# Patient Record
Sex: Female | Born: 1995 | Race: Black or African American | Hispanic: No | Marital: Single | Smoking: Never smoker
Health system: Southern US, Community
[De-identification: ages and names within clinical notes are randomized; demographics above are authoritative.]

## PROBLEM LIST (undated history)

## (undated) ENCOUNTER — Inpatient Hospital Stay (HOSPITAL_COMMUNITY): Payer: Self-pay

## (undated) DIAGNOSIS — F329 Major depressive disorder, single episode, unspecified: Secondary | ICD-10-CM

## (undated) DIAGNOSIS — S060XAA Concussion with loss of consciousness status unknown, initial encounter: Secondary | ICD-10-CM

## (undated) DIAGNOSIS — F32A Depression, unspecified: Secondary | ICD-10-CM

## (undated) DIAGNOSIS — A64 Unspecified sexually transmitted disease: Secondary | ICD-10-CM

## (undated) DIAGNOSIS — E119 Type 2 diabetes mellitus without complications: Secondary | ICD-10-CM

## (undated) DIAGNOSIS — G43009 Migraine without aura, not intractable, without status migrainosus: Secondary | ICD-10-CM

## (undated) DIAGNOSIS — S060X9A Concussion with loss of consciousness of unspecified duration, initial encounter: Secondary | ICD-10-CM

## (undated) DIAGNOSIS — F419 Anxiety disorder, unspecified: Secondary | ICD-10-CM

## (undated) DIAGNOSIS — N946 Dysmenorrhea, unspecified: Secondary | ICD-10-CM

## (undated) DIAGNOSIS — G43909 Migraine, unspecified, not intractable, without status migrainosus: Secondary | ICD-10-CM

## (undated) HISTORY — PX: WISDOM TOOTH EXTRACTION: SHX21

## (undated) HISTORY — DX: Unspecified sexually transmitted disease: A64

## (undated) HISTORY — DX: Dysmenorrhea, unspecified: N94.6

## (undated) HISTORY — DX: Anxiety disorder, unspecified: F41.9

## (undated) HISTORY — DX: Depression, unspecified: F32.A

## (undated) HISTORY — PX: MOUTH SURGERY: SHX715

## (undated) HISTORY — PX: NO PAST SURGERIES: SHX2092

## (undated) HISTORY — DX: Type 2 diabetes mellitus without complications: E11.9

## (undated) HISTORY — DX: Migraine without aura, not intractable, without status migrainosus: G43.009

---
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
## 2014-02-14 ENCOUNTER — Encounter (HOSPITAL_COMMUNITY): Payer: Self-pay | Admitting: Emergency Medicine

## 2014-02-14 ENCOUNTER — Emergency Department (INDEPENDENT_AMBULATORY_CARE_PROVIDER_SITE_OTHER)
Admission: EM | Admit: 2014-02-14 | Discharge: 2014-02-14 | Disposition: A | Discharge status: Home / Self Care | Payer: Medicaid Other | Source: Home / Self Care | Attending: Emergency Medicine | Admitting: Emergency Medicine

## 2014-02-14 DIAGNOSIS — G43909 Migraine, unspecified, not intractable, without status migrainosus: Secondary | ICD-10-CM

## 2014-02-14 MED ORDER — KETOROLAC TROMETHAMINE 60 MG/2ML IM SOLN
30.0000 mg | Freq: Once | INTRAMUSCULAR | Status: AC
  Administered 2014-02-14: 30 mg via INTRAMUSCULAR

## 2014-02-14 MED ORDER — SUMATRIPTAN SUCCINATE 50 MG PO TABS: 50.0000 mg | ORAL_TABLET | ORAL | Status: DC | PRN

## 2014-02-14 MED ORDER — KETOROLAC TROMETHAMINE 30 MG/ML IJ SOLN
INTRAMUSCULAR | Status: AC
  Filled 2014-02-14: qty 1

## 2014-02-14 MED ORDER — DEXAMETHASONE SODIUM PHOSPHATE 10 MG/ML IJ SOLN
INTRAMUSCULAR | Status: AC
  Filled 2014-02-14: qty 1

## 2014-02-14 MED ORDER — DEXAMETHASONE SODIUM PHOSPHATE 10 MG/ML IJ SOLN
10.0000 mg | Freq: Once | INTRAMUSCULAR | Status: AC
  Administered 2014-02-14: 10 mg via INTRAMUSCULAR

## 2014-02-14 MED ORDER — SUMATRIPTAN SUCCINATE 6 MG/0.5ML ~~LOC~~ SOLN
6.0000 mg | Freq: Once | SUBCUTANEOUS | Status: AC
  Administered 2014-02-14: 6 mg via SUBCUTANEOUS

## 2014-02-14 MED ORDER — SUMATRIPTAN SUCCINATE 6 MG/0.5ML ~~LOC~~ SOLN
SUBCUTANEOUS | Status: AC
  Filled 2014-02-14: qty 0.5

## 2014-02-14 NOTE — Discharge Instructions (Signed)
Migraine Headache A migraine headache is an intense, throbbing pain on one or both sides of your head. A migraine can last for 30 minutes to several hours. CAUSES  The exact cause of a migraine headache is not always known. However, a migraine may be caused when nerves in the brain become irritated and release chemicals that cause inflammation. This causes pain. Certain things may also trigger migraines, such as:  Alcohol.  Smoking.  Stress.  Menstruation.  Aged cheeses.  Foods or drinks that contain nitrates, glutamate, aspartame, or tyramine.  Lack of sleep.  Chocolate.  Caffeine.  Hunger.  Physical exertion.  Fatigue.  Medicines used to treat chest pain (nitroglycerine), birth control pills, estrogen, and some blood pressure medicines. SIGNS AND SYMPTOMS  Pain on one or both sides of your head.  Pulsating or throbbing pain.  Severe pain that prevents daily activities.  Pain that is aggravated by any physical activity.  Nausea, vomiting, or both.  Dizziness.  Pain with exposure to bright lights, loud noises, or activity.  General sensitivity to bright lights, loud noises, or smells. Before you get a migraine, you may get warning signs that a migraine is coming (aura). An aura may include:  Seeing flashing lights.  Seeing bright spots, halos, or zig-zag lines.  Having tunnel vision or blurred vision.  Having feelings of numbness or tingling.  Having trouble talking.  Having muscle weakness. DIAGNOSIS  A migraine headache is often diagnosed based on:  Symptoms.  Physical exam.  A CT scan or MRI of your head. These imaging tests cannot diagnose migraines, but they can help rule out other causes of headaches. TREATMENT Medicines may be given for pain and nausea. Medicines can also be given to help prevent recurrent migraines.  HOME CARE INSTRUCTIONS  Only take over-the-counter or prescription medicines for pain or discomfort as directed by your  health care provider. The use of long-term narcotics is not recommended.  Lie down in a dark, quiet room when you have a migraine.  Keep a journal to find out what may trigger your migraine headaches. For example, write down:  What you eat and drink.  How much sleep you get.  Any change to your diet or medicines.  Limit alcohol consumption.  Quit smoking if you smoke.  Get 7 9 hours of sleep, or as recommended by your health care provider.  Limit stress.  Keep lights dim if bright lights bother you and make your migraines worse. SEEK IMMEDIATE MEDICAL CARE IF:   Your migraine becomes severe.  You have a fever.  You have a stiff neck.  You have vision loss.  You have muscular weakness or loss of muscle control.  You start losing your balance or have trouble walking.  You feel faint or pass out.  You have severe symptoms that are different from your first symptoms. MAKE SURE YOU:   Understand these instructions.  Will watch your condition.  Will get help right away if you are not doing well or get worse. Document Released: 10/22/2005 Document Revised: 08/12/2013 Document Reviewed: 06/29/2013 [employer] Patient Information 2014 [employer].  

## 2014-02-14 NOTE — ED Provider Notes (Signed)
  Chief Complaint   Chief Complaint  Patient presents with  . Migraine    History of Present Illness   Ebony Snyder is a 18 year old female who has had a history since last night of a migraine type headache localized to the right side. This is not throbbing in nature. Last night it was a 9/10 in intensity and now is down to 5/10 in intensity. It's been associated with subjective fever but no stiff neck. She denies any diplopia, blurry vision, or other visual symptoms. She's had no numbness, tingling, paresthesias, weakness, or difficulty with speech or ambulation and the patient has had migraines for 3 or 4 years. These may occur as often as 2 per week or as infrequently as once every few months. Her last migraine headache was in March. There is no specific precipitating factor.  Review of Systems   Other than as noted above, the patient denies any of the following symptoms: Systemic:  No fever, chills, photophobia, or stiff neck. Eye:  No blurred vision, or diplopia. ENT:  No nasal congestion, rhinorrhea, sinus pressure or pain, or sore throat.  No jaw claudication. Neuro:  No paresthesias, loss of consciousness, seizure activity, muscle weakness, trouble with coordination or gait, trouble speaking or swallowing. Psych:  No depression, anxiety or trouble sleeping.  PMFSH   Past medical history, family history, social history, meds, and allergies were reviewed.    Physical Examination    Vital signs:  BP 109/70  Pulse 64  Temp(Src) 98.2 F (36.8 C) (Oral)  Resp 18  SpO2 98% General:  Alert and oriented.  In no distress. Eye:  Lids and conjunctivas normal.  PERRL,  Full EOMs.  Fundi benign with normal discs and vessels. ENT:  No cranial or facial tenderness to palpation.  TMs and canals clear.  Nasal mucosa was normal and uncongested without any drainage. No intra oral lesions, pharynx clear, mucous membranes moist, dentition normal. Neck:  Supple, full ROM, no tenderness to  palpation.  No adenopathy or mass. Neuro:  Alert and orented times 3.  Speech was clear, fluent, and appropriate.  Cranial nerves intact. No pronator drift, muscle strength normal. Finger to nose normal.  DTRs were 2+ and symmetrical.Station and gait were normal.  Romberg's sign was normal.  Able to perform tandem gait well. Psych:  Normal affect.  Course in [facility]     Given Imitrex 6 mg subcutaneous, Toradol 30 mg IM, and Decadron 10 mg IM.  Assessment   The encounter diagnosis was Migraine headache.  Plan   1.  Meds:  The following meds were prescribed:   Discharge Medication List as of 02/14/2014  5:35 PM    START taking these medications   Details  SUMAtriptan (IMITREX) 50 MG tablet Take 1 tablet (50 mg total) by mouth every 2 (two) hours as needed for migraine or headache. May repeat in 2 hours if headache persists or recurs., Starting 02/14/2014, Until Discontinued, Normal        2.  Patient Education/Counseling:  The patient was given appropriate handouts, self care instructions, and instructed in symptomatic relief.    3.  Follow up:  The patient was told to follow up here if no better in 3 to 4 days, or sooner if becoming worse in any way, and given some red flag symptoms such as fever, worsening pain, persistent vomiting, or new neurological symptoms which would prompt immediate return.       Reuben Likesavid C Ulysse Siemen, MD 02/14/14 [phone]

## 2014-02-14 NOTE — ED Notes (Signed)
Pt c/o migraine headaches x 1 day. This is a recurrent problem. Pt reports she usually takes tramadol but ran out of her prescription. Pt has a throbbing pain over her right eye. Pt is alert and oriented.

## 2014-06-28 ENCOUNTER — Emergency Department (HOSPITAL_COMMUNITY)
Admission: EM | Admit: 2014-06-28 | Discharge: 2014-06-28 | Disposition: A | Discharge status: Home / Self Care | Payer: No Typology Code available for payment source | Attending: Emergency Medicine | Admitting: Emergency Medicine

## 2014-06-28 ENCOUNTER — Encounter (HOSPITAL_COMMUNITY): Payer: Self-pay | Admitting: Emergency Medicine

## 2014-06-28 ENCOUNTER — Emergency Department (HOSPITAL_COMMUNITY): Payer: No Typology Code available for payment source

## 2014-06-28 DIAGNOSIS — Z8679 Personal history of other diseases of the circulatory system: Secondary | ICD-10-CM | POA: Diagnosis not present

## 2014-06-28 DIAGNOSIS — S0993XA Unspecified injury of face, initial encounter: Secondary | ICD-10-CM | POA: Diagnosis not present

## 2014-06-28 DIAGNOSIS — S199XXA Unspecified injury of neck, initial encounter: Secondary | ICD-10-CM

## 2014-06-28 DIAGNOSIS — Y92410 Unspecified street and highway as the place of occurrence of the external cause: Secondary | ICD-10-CM | POA: Insufficient documentation

## 2014-06-28 DIAGNOSIS — S060X0A Concussion without loss of consciousness, initial encounter: Secondary | ICD-10-CM | POA: Diagnosis not present

## 2014-06-28 DIAGNOSIS — S0990XA Unspecified injury of head, initial encounter: Secondary | ICD-10-CM | POA: Insufficient documentation

## 2014-06-28 DIAGNOSIS — Y9389 Activity, other specified: Secondary | ICD-10-CM | POA: Diagnosis not present

## 2014-06-28 MED ORDER — ONDANSETRON HCL 4 MG PO TABS: 4.0000 mg | ORAL_TABLET | Freq: Four times a day (QID) | ORAL | Status: DC

## 2014-06-28 MED ORDER — KETOROLAC TROMETHAMINE 30 MG/ML IJ SOLN: 30.0000 mg | Freq: Once | INTRAMUSCULAR | Status: DC

## 2014-06-28 MED ORDER — METOCLOPRAMIDE HCL 5 MG/ML IJ SOLN: 10.0000 mg | Freq: Once | INTRAMUSCULAR | Status: DC

## 2014-06-28 MED ORDER — IBUPROFEN 800 MG PO TABS
800.0000 mg | ORAL_TABLET | Freq: Once | ORAL | Status: AC
  Administered 2014-06-28: 800 mg via ORAL
  Filled 2014-06-28: qty 1

## 2014-06-28 MED ORDER — SODIUM CHLORIDE 0.9 % IV BOLUS (SEPSIS)
1000.0000 mL | Freq: Once | INTRAVENOUS | Status: DC
Start: 2014-06-28 — End: 2014-06-28

## 2014-06-28 MED ORDER — IBUPROFEN 800 MG PO TABS: 800.0000 mg | ORAL_TABLET | Freq: Three times a day (TID) | ORAL | Status: DC

## 2014-06-28 MED ORDER — DIPHENHYDRAMINE HCL 50 MG/ML IJ SOLN
25.0000 mg | Freq: Once | INTRAMUSCULAR | Status: DC
Start: 2014-06-28 — End: 2014-06-28
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
## 2014-06-28 NOTE — Discharge Instructions (Signed)
Concussion  A concussion, or closed-head injury, is a brain injury caused by a direct blow to the head or by a quick and sudden movement (jolt) of the head or neck. Concussions are usually not life-threatening. Even so, the effects of a concussion can be serious. If you have had a concussion before, you are more likely to experience concussion-like symptoms after a direct blow to the head.   CAUSES  · Direct blow to the head, such as from running into another player during a soccer game, being hit in a fight, or hitting your head on a hard surface.  · A jolt of the head or neck that causes the brain to move back and forth inside the skull, such as in a car crash.  SIGNS AND SYMPTOMS  The signs of a concussion can be hard to notice. Early on, they may be missed by you, family members, and health care providers. You may look fine but act or feel differently.  Symptoms are usually temporary, but they may last for days, weeks, or even longer. Some symptoms may appear right away while others may not show up for hours or days. Every head injury is different. Symptoms include:  · Mild to moderate headaches that will not go away.  · A feeling of pressure inside your head.  · Having more trouble than usual:  ¨ Learning or remembering things you have heard.  ¨ Answering questions.  ¨ Paying attention or concentrating.  ¨ Organizing daily tasks.  ¨ Making decisions and solving problems.  · Slowness in thinking, acting or reacting, speaking, or reading.  · Getting lost or being easily confused.  · Feeling tired all the time or lacking energy (fatigued).  · Feeling drowsy.  · Sleep disturbances.  ¨ Sleeping more than usual.  ¨ Sleeping less than usual.  ¨ Trouble falling asleep.  ¨ Trouble sleeping (insomnia).  · Loss of balance or feeling lightheaded or dizzy.  · Nausea or vomiting.  · Numbness or tingling.  · Increased sensitivity to:  ¨ Sounds.  ¨ Lights.  ¨ Distractions.  · Vision problems or eyes that tire  easily.  · Diminished sense of taste or smell.  · Ringing in the ears.  · Mood changes such as feeling sad or anxious.  · Becoming easily irritated or angry for little or no reason.  · Lack of motivation.  · Seeing or hearing things other people do not see or hear (hallucinations).  DIAGNOSIS  Your health care provider can usually diagnose a concussion based on a description of your injury and symptoms. He or she will ask whether you passed out (lost consciousness) and whether you are having trouble remembering events that happened right before and during your injury.  Your evaluation might include:  · A brain scan to look for signs of injury to the brain. Even if the test shows no injury, you may still have a concussion.  · Blood tests to be sure other problems are not present.  TREATMENT  · Concussions are usually treated in an emergency department, in urgent care, or at a clinic. You may need to stay in the hospital overnight for further treatment.  · Tell your health care provider if you are taking any medicines, including prescription medicines, over-the-counter medicines, and natural remedies. Some medicines, such as blood thinners (anticoagulants) and aspirin, may increase the chance of complications. Also tell your health care provider whether you have had alcohol or are taking illegal drugs. This information   may affect treatment.  · Your health care provider will send you home with important instructions to follow.  · How fast you will recover from a concussion depends on many factors. These factors include how severe your concussion is, what part of your brain was injured, your age, and how healthy you were before the concussion.  · Most people with mild injuries recover fully. Recovery can take time. In general, recovery is slower in older persons. Also, persons who have had a concussion in the past or have other medical problems may find that it takes longer to recover from their current injury.  HOME  CARE INSTRUCTIONS  General Instructions  · Carefully follow the directions your health care provider gave you.  · Only take over-the-counter or prescription medicines for pain, discomfort, or fever as directed by your health care provider.  · Take only those medicines that your health care provider has approved.  · Do not drink alcohol until your health care provider says you are well enough to do so. Alcohol and certain other drugs may slow your recovery and can put you at risk of further injury.  · If it is harder than usual to remember things, write them down.  · If you are easily distracted, try to do one thing at a time. For example, do not try to watch TV while fixing dinner.  · Talk with family members or close friends when making important decisions.  · Keep all follow-up appointments. Repeated evaluation of your symptoms is recommended for your recovery.  · Watch your symptoms and tell others to do the same. Complications sometimes occur after a concussion. Older adults with a brain injury may have a higher risk of serious complications, such as a blood clot on the brain.  · Tell your teachers, school nurse, school counselor, coach, athletic trainer, or work manager about your injury, symptoms, and restrictions. Tell them about what you can or cannot do. They should watch for:  ¨ Increased problems with attention or concentration.  ¨ Increased difficulty remembering or learning new information.  ¨ Increased time needed to complete tasks or assignments.  ¨ Increased irritability or decreased ability to cope with stress.  ¨ Increased symptoms.  · Rest. Rest helps the brain to heal. Make sure you:  ¨ Get plenty of sleep at night. Avoid staying up late at night.  ¨ Keep the same bedtime hours on weekends and weekdays.  ¨ Rest during the day. Take daytime naps or rest breaks when you feel tired.  · Limit activities that require a lot of thought or concentration. These include:  ¨ Doing homework or job-related  work.  ¨ Watching TV.  ¨ Working on the computer.  · Avoid any situation where there is potential for another head injury (football, hockey, soccer, basketball, martial arts, downhill snow sports and horseback riding). Your condition will get worse every time you experience a concussion. You should avoid these activities until you are evaluated by the appropriate follow-up health care providers.  Returning To Your Regular Activities  You will need to return to your normal activities slowly, not all at once. You must give your body and brain enough time for recovery.  · Do not return to sports or other athletic activities until your health care provider tells you it is safe to do so.  · Ask your health care provider when you can drive, ride a bicycle, or operate heavy machinery. Your ability to react may be slower after a   brain injury. Never do these activities if you are dizzy.  · Ask your health care provider about when you can return to work or school.  Preventing Another Concussion  It is very important to avoid another brain injury, especially before you have recovered. In rare cases, another injury can lead to permanent brain damage, brain swelling, or death. The risk of this is greatest during the first 7-10 days after a head injury. Avoid injuries by:  · Wearing a seat belt when riding in a car.  · Drinking alcohol only in moderation.  · Wearing a helmet when biking, skiing, skateboarding, skating, or doing similar activities.  · Avoiding activities that could lead to a second concussion, such as contact or recreational sports, until your health care provider says it is okay.  · Taking safety measures in your home.  ¨ Remove clutter and tripping hazards from floors and stairways.  ¨ Use grab bars in bathrooms and handrails by stairs.  ¨ Place non-slip mats on floors and in bathtubs.  ¨ Improve lighting in dim areas.  SEEK MEDICAL CARE IF:  · You have increased problems paying attention or  concentrating.  · You have increased difficulty remembering or learning new information.  · You need more time to complete tasks or assignments than before.  · You have increased irritability or decreased ability to cope with stress.  · You have more symptoms than before.  Seek medical care if you have any of the following symptoms for more than 2 weeks after your injury:  · Lasting (chronic) headaches.  · Dizziness or balance problems.  · Nausea.  · Vision problems.  · Increased sensitivity to noise or light.  · Depression or mood swings.  · Anxiety or irritability.  · Memory problems.  · Difficulty concentrating or paying attention.  · Sleep problems.  · Feeling tired all the time.  SEEK IMMEDIATE MEDICAL CARE IF:  · You have severe or worsening headaches. These may be a sign of a blood clot in the brain.  · You have weakness (even if only in one hand, leg, or part of the face).  · You have numbness.  · You have decreased coordination.  · You vomit repeatedly.  · You have increased sleepiness.  · One pupil is larger than the other.  · You have convulsions.  · You have slurred speech.  · You have increased confusion. This may be a sign of a blood clot in the brain.  · You have increased restlessness, agitation, or irritability.  · You are unable to recognize people or places.  · You have neck pain.  · It is difficult to wake you up.  · You have unusual behavior changes.  · You lose consciousness.  MAKE SURE YOU:  · Understand these instructions.  · Will watch your condition.  · Will get help right away if you are not doing well or get worse.  Document Released: 01/12/2004 Document Revised: 10/27/2013 Document Reviewed: 05/14/2013  Exit[employer]® Patient Information ©2015 [employer]. This information is not intended to replace advice given to you by your health care provider. Make sure you discuss any questions you have with your health care provider.

## 2014-08-25 ENCOUNTER — Encounter (HOSPITAL_COMMUNITY): Payer: Self-pay | Admitting: Emergency Medicine

## 2014-08-25 ENCOUNTER — Emergency Department (HOSPITAL_COMMUNITY)
Admission: EM | Admit: 2014-08-25 | Discharge: 2014-08-25 | Discharge status: Against Medical Advice | Payer: No Typology Code available for payment source | Attending: Emergency Medicine | Admitting: Emergency Medicine

## 2014-08-25 DIAGNOSIS — Y9389 Activity, other specified: Secondary | ICD-10-CM | POA: Insufficient documentation

## 2014-08-25 DIAGNOSIS — Y92410 Unspecified street and highway as the place of occurrence of the external cause: Secondary | ICD-10-CM | POA: Insufficient documentation

## 2014-08-25 DIAGNOSIS — S0990XA Unspecified injury of head, initial encounter: Secondary | ICD-10-CM | POA: Insufficient documentation

## 2014-08-25 NOTE — ED Notes (Signed)
Patient is not in the room yet.  

## 2014-08-25 NOTE — ED Notes (Signed)
Unable to locate pt - pager has been returned to desk.

## 2014-08-25 NOTE — ED Notes (Signed)
Patient is not in the room.
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
## 2014-09-21 ENCOUNTER — Encounter (HOSPITAL_COMMUNITY): Payer: Self-pay

## 2014-09-21 ENCOUNTER — Emergency Department (HOSPITAL_COMMUNITY)
Admission: EM | Admit: 2014-09-21 | Discharge: 2014-09-21 | Disposition: A | Discharge status: Home / Self Care | Payer: BC Managed Care – PPO | Attending: Emergency Medicine | Admitting: Emergency Medicine

## 2014-09-21 DIAGNOSIS — Z720 Tobacco use: Secondary | ICD-10-CM | POA: Insufficient documentation

## 2014-09-21 DIAGNOSIS — G43909 Migraine, unspecified, not intractable, without status migrainosus: Secondary | ICD-10-CM | POA: Insufficient documentation

## 2014-09-21 DIAGNOSIS — Z87828 Personal history of other (healed) physical injury and trauma: Secondary | ICD-10-CM | POA: Insufficient documentation

## 2014-09-21 DIAGNOSIS — Z3202 Encounter for pregnancy test, result negative: Secondary | ICD-10-CM | POA: Insufficient documentation

## 2014-09-21 DIAGNOSIS — R062 Wheezing: Secondary | ICD-10-CM | POA: Insufficient documentation

## 2014-09-21 LAB — URINALYSIS, ROUTINE W REFLEX MICROSCOPIC
Bilirubin Urine: NEGATIVE
GLUCOSE, UA: NEGATIVE mg/dL
Ketones, ur: 15 mg/dL — AB
Nitrite: NEGATIVE
PROTEIN: 100 mg/dL — AB
SPECIFIC GRAVITY, URINE: 1.029 (ref 1.005–1.030)
UROBILINOGEN UA: 1 mg/dL (ref 0.0–1.0)
pH: 8 (ref 5.0–8.0)

## 2014-09-21 LAB — URINE MICROSCOPIC-ADD ON

## 2014-09-21 LAB — POC URINE PREG, ED: PREG TEST UR: NEGATIVE

## 2014-09-21 MED ORDER — KETOROLAC TROMETHAMINE 60 MG/2ML IM SOLN
60.0000 mg | Freq: Once | INTRAMUSCULAR | Status: AC
  Administered 2014-09-21: 60 mg via INTRAMUSCULAR
  Filled 2014-09-21: qty 2

## 2014-09-21 MED ORDER — ALBUTEROL SULFATE HFA 108 (90 BASE) MCG/ACT IN AERS
2.0000 | INHALATION_SPRAY | Freq: Once | RESPIRATORY_TRACT | Status: AC
  Administered 2014-09-21: 2 via RESPIRATORY_TRACT
  Filled 2014-09-21: qty 6.7

## 2014-09-21 MED ORDER — METOCLOPRAMIDE HCL 5 MG/ML IJ SOLN
10.0000 mg | Freq: Once | INTRAMUSCULAR | Status: DC
Start: 2014-09-21 — End: 2014-09-21

## 2014-09-21 MED ORDER — DIPHENHYDRAMINE HCL 50 MG/ML IJ SOLN
25.0000 mg | Freq: Once | INTRAMUSCULAR | Status: AC
  Administered 2014-09-21: 25 mg via INTRAMUSCULAR
  Filled 2014-09-21: qty 1

## 2014-09-21 MED ORDER — TRAMADOL HCL 50 MG PO TABS: 50.0000 mg | ORAL_TABLET | Freq: Four times a day (QID) | ORAL | Status: DC | PRN

## 2014-09-21 MED ORDER — METOCLOPRAMIDE HCL 5 MG/ML IJ SOLN
10.0000 mg | Freq: Once | INTRAMUSCULAR | Status: AC
  Administered 2014-09-21: 10 mg via INTRAMUSCULAR
  Filled 2014-09-21: qty 2

## 2014-09-21 NOTE — Discharge Instructions (Signed)
Tramadol as needed for pain. I would also try Excedrin migraine. Follow up with your doctor.    Migraine Headache A migraine headache is an intense, throbbing pain on one or both sides of your head. A migraine can last for 30 minutes to several hours. CAUSES  The exact cause of a migraine headache is not always known. However, a migraine may be caused when nerves in the brain become irritated and release chemicals that cause inflammation. This causes pain. Certain things may also trigger migraines, such as:  Alcohol.  Smoking.  Stress.  Menstruation.  Aged cheeses.  Foods or drinks that contain nitrates, glutamate, aspartame, or tyramine.  Lack of sleep.  Chocolate.  Caffeine.  Hunger.  Physical exertion.  Fatigue.  Medicines used to treat chest pain (nitroglycerine), birth control pills, estrogen, and some blood pressure medicines. SIGNS AND SYMPTOMS  Pain on one or both sides of your head.  Pulsating or throbbing pain.  Severe pain that prevents daily activities.  Pain that is aggravated by any physical activity.  Nausea, vomiting, or both.  Dizziness.  Pain with exposure to bright lights, loud noises, or activity.  General sensitivity to bright lights, loud noises, or smells. Before you get a migraine, you may get warning signs that a migraine is coming (aura). An aura may include:  Seeing flashing lights.  Seeing bright spots, halos, or zigzag lines.  Having tunnel vision or blurred vision.  Having feelings of numbness or tingling.  Having trouble talking.  Having muscle weakness. DIAGNOSIS  A migraine headache is often diagnosed based on:  Symptoms.  Physical exam.  A CT scan or MRI of your head. These imaging tests cannot diagnose migraines, but they can help rule out other causes of headaches. TREATMENT Medicines may be given for pain and nausea. Medicines can also be given to help prevent recurrent migraines.  HOME CARE  INSTRUCTIONS  Only take over-the-counter or prescription medicines for pain or discomfort as directed by your health care provider. The use of long-term narcotics is not recommended.  Lie down in a dark, quiet room when you have a migraine.  Keep a journal to find out what may trigger your migraine headaches. For example, write down:  What you eat and drink.  How much sleep you get.  Any change to your diet or medicines.  Limit alcohol consumption.  Quit smoking if you smoke.  Get 7-9 hours of sleep, or as recommended by your health care provider.  Limit stress.  Keep lights dim if bright lights bother you and make your migraines worse. SEEK IMMEDIATE MEDICAL CARE IF:   Your migraine becomes severe.  You have a fever.  You have a stiff neck.  You have vision loss.  You have muscular weakness or loss of muscle control.  You start losing your balance or have trouble walking.  You feel faint or pass out.  You have severe symptoms that are different from your first symptoms. MAKE SURE YOU:   Understand these instructions.  Will watch your condition.  Will get help right away if you are not doing well or get worse. Document Released: 10/22/2005 Document Revised: 03/08/2014 Document Reviewed: 06/29/2013 [facility] CExit[facility] Patient Information 2015 [place], [place]. This information is not intended to replace advice given to you by your health care provider. Make sure you discuss any questions you have with your health care provider.

## 2014-09-21 NOTE — ED Provider Notes (Signed)
CSN: [id number]     Arrival date & time 09/21/14  1028 History   First MD Initiated Contact with Patient 09/21/14 1117     Chief Complaint  Patient presents with  . Migraine     (Consider location/radiation/quality/duration/timing/severity/associated sxs/prior Treatment) HPI Ebony Snyder is a 18 y.o. female who presents to ED complaining of a migraine headache. Pt states she has hx of migraines. States she headache started this morning. Reports pain in right side of the head, blurred vision, typical of her migraines. States used to get them every few days, but recently she has not had them as frequently. States normally takes tramadol for this but ran out. No medications taken prior to coming in. Pt reports associated nausea and vomiting. No numbness or weakness in extremities. No difficulties with speech, walking, with coordination. No other complaints.   Past Medical History  Diagnosis Date  . Migraines   . Concussion    History reviewed. No pertinent past surgical history. No family history on file. History  Substance Use Topics  . Smoking status: Never Smoker   . Smokeless tobacco: Not on file  . Alcohol Use: No   OB History    No data available     Review of Systems  Constitutional: Negative for fever and chills.  Eyes: Positive for photophobia and visual disturbance.  Respiratory: Negative for cough, chest tightness and shortness of breath.   Cardiovascular: Negative for chest pain, palpitations and leg swelling.  Gastrointestinal: Negative for nausea, vomiting, abdominal pain and diarrhea.  Genitourinary: Negative for dysuria.  Musculoskeletal: Negative for myalgias, arthralgias, neck pain and neck stiffness.  Skin: Negative for rash.  Neurological: Positive for dizziness and headaches. Negative for weakness.  All other systems reviewed and are negative.     Allergies  Review of patient's allergies indicates no known allergies.  Home Medications   Prior  to Admission medications   Not on File   BP 105/60 mmHg  Pulse 64  Temp(Src) 97.7 F (36.5 C) (Oral)  Resp 20  SpO2 97%  LMP 08/17/2014 Physical Exam  Constitutional: She is oriented to person, place, and time. She appears well-developed and well-nourished. No distress.  HENT:  Head: Normocephalic.  Eyes: Conjunctivae and EOM are normal. Pupils are equal, round, and reactive to light.  Neck: Neck supple.  Cardiovascular: Normal rate, regular rhythm and normal heart sounds.   Pulmonary/Chest: Effort normal. No respiratory distress. She has wheezes. She has no rales.  Abdominal: Soft. Bowel sounds are normal. She exhibits no distension. There is no tenderness. There is no rebound.  Musculoskeletal: She exhibits no edema.  Neurological: She is alert and oriented to person, place, and time. No cranial nerve deficit. Coordination normal.  5/5 and equal upper and lower extremity strength bilaterally. Equal grip strength bilaterally. Normal finger to nose and heel to shin. No pronator drift.   Skin: Skin is warm and dry.  Psychiatric: She has a normal mood and affect. Her behavior is normal.  Nursing note and vitals reviewed.   ED Course  Procedures (including critical care time) Labs Review Labs Reviewed  URINALYSIS, ROUTINE W REFLEX MICROSCOPIC - Abnormal; Notable for the following:    Color, Urine AMBER (*)    APPearance TURBID (*)    Hgb urine dipstick LARGE (*)    Ketones, ur 15 (*)    Protein, ur 100 (*)    Leukocytes, UA SMALL (*)    All other components within normal limits  URINE MICROSCOPIC-ADD ON -  Abnormal; Notable for the following:    Squamous Epithelial / LPF FEW (*)    Bacteria, UA FEW (*)    Crystals TRIPLE PHOSPHATE CRYSTALS (*)    All other components within normal limits  POC URINE PREG, ED    Imaging Review No results found.   EKG Interpretation None      MDM   Final diagnoses:  Migraine without status migrainosus, not intractable, unspecified  migraine type   Patient is here with typical for her migraine, onset this morning. Will treat with Toradol, Reglan, Benadryl. A urine analysis urine pregnancy ordered. Patient also noted to be wheezing on exam. She is a smoker. Will give albuterol inhaler. vital signs are normal.  1:07 PM Patient is feeling much better. Wants to go home. States she usually takes tramadol at home but ran out. We'll prescribed 15 tablets. Follow-up with primary care doctor  Filed Vitals:   09/21/14 1126 09/21/14 1130 09/21/14 1230 09/21/14 1323  BP: 105/60 112/76 98/47 123/102  Pulse: 64 76 71 56  Temp:    98.6 F (37 C)  TempSrc:    Oral  Resp: 20   18  SpO2: 97% 99% 97% 100%     Lottie Musselatyana A Glenice Ciccone, PA-C 09/21/14 1600  Rolland PorterMark James, MD 09/29/14 1339

## 2014-09-21 NOTE — ED Notes (Signed)
PA at the bedside.

## 2014-09-21 NOTE — ED Notes (Addendum)
Pt reports migraine that started this morning.  Pt alert and oriented.  Pt denies taking any medicines today.

## 2014-12-25 ENCOUNTER — Encounter (HOSPITAL_COMMUNITY): Payer: Self-pay | Admitting: Adult Health

## 2014-12-25 ENCOUNTER — Emergency Department (HOSPITAL_COMMUNITY)
Admission: EM | Admit: 2014-12-25 | Discharge: 2014-12-25 | Disposition: A | Discharge status: Home / Self Care | Payer: Medicaid Other | Attending: Emergency Medicine | Admitting: Emergency Medicine

## 2014-12-25 DIAGNOSIS — Z87828 Personal history of other (healed) physical injury and trauma: Secondary | ICD-10-CM | POA: Insufficient documentation

## 2014-12-25 DIAGNOSIS — G43109 Migraine with aura, not intractable, without status migrainosus: Secondary | ICD-10-CM | POA: Insufficient documentation

## 2014-12-25 MED ORDER — DEXAMETHASONE 4 MG PO TABS
12.0000 mg | ORAL_TABLET | Freq: Once | ORAL | Status: AC
  Administered 2014-12-25: 12 mg via ORAL
  Filled 2014-12-25: qty 3

## 2014-12-25 MED ORDER — DIPHENHYDRAMINE HCL 25 MG PO CAPS
25.0000 mg | ORAL_CAPSULE | Freq: Once | ORAL | Status: DC
  Filled 2014-12-25: qty 1

## 2014-12-25 MED ORDER — DEXAMETHASONE 1 MG/ML PO CONC: 12.0000 mg | Freq: Once | ORAL | Status: DC

## 2014-12-25 MED ORDER — METOCLOPRAMIDE HCL 10 MG PO TABS
10.0000 mg | ORAL_TABLET | Freq: Once | ORAL | Status: AC
  Administered 2014-12-25: 10 mg via ORAL
  Filled 2014-12-25: qty 1

## 2014-12-25 MED ORDER — TRAMADOL HCL 50 MG PO TABS: 50.0000 mg | ORAL_TABLET | Freq: Four times a day (QID) | ORAL | Status: DC | PRN

## 2014-12-25 MED ORDER — TRAMADOL HCL 50 MG PO TABS: 50.0000 mg | ORAL_TABLET | Freq: Once | ORAL | Status: DC

## 2014-12-25 NOTE — ED Notes (Signed)
Resent with blurred vision and throbbing pain behind left eye-this happens in her right eye when she gets migraines, denies headache at this time, dneis nausea. Pain began at 7 pm

## 2014-12-25 NOTE — Discharge Instructions (Signed)

## 2014-12-25 NOTE — ED Provider Notes (Signed)
CSN: [id number]     Arrival date & time 12/25/14  2112 History   First MD Initiated Contact with Patient 12/25/14 2130     Chief Complaint  Patient presents with  . Blurred Vision     (Consider location/radiation/quality/duration/timing/severity/associated sxs/prior Treatment) HPI Comments: L throbbing and blurred vision, resolved now. This is her normal aura prior to her migraines.  Patient is a 19 y.o. female presenting with eye problem. The history is provided by the patient.  Eye Problem Location:  L eye Quality: throbbing. Severity:  Mild Onset quality:  Sudden Timing:  Constant Progression:  Resolved Chronicity:  Recurrent Associated symptoms: no vomiting     Past Medical History  Diagnosis Date  . Migraines   . Concussion    History reviewed. No pertinent past surgical history. History reviewed. No pertinent family history. History  Substance Use Topics  . Smoking status: Never Smoker   . Smokeless tobacco: Not on file  . Alcohol Use: No   OB History    No data available     Review of Systems  Constitutional: Negative for fever.  Respiratory: Negative for cough and shortness of breath.   Gastrointestinal: Negative for vomiting and abdominal pain.  All other systems reviewed and are negative.     Allergies  Review of patient's allergies indicates no known allergies.  Home Medications   Prior to Admission medications   Medication Sig Start Date End Date Taking? Authorizing Provider  traMADol (ULTRAM) 50 MG tablet Take 1 tablet (50 mg total) by mouth every 6 (six) hours as needed. 09/21/14   Tatyana A Kirichenko, PA-C   BP 117/72 mmHg  Pulse 74  Temp(Src) 99.4 F (37.4 C) (Oral)  Resp 18  Ht 5\' 2"  (1.575 m)  Wt 172 lb (78.019 kg)  BMI 31.45 kg/m2  SpO2 100%  LMP 11/30/2014 (Exact Date) Physical Exam  Constitutional: She is oriented to person, place, and time. She appears well-developed and well-nourished. No distress.  HENT:  Head:  Normocephalic and atraumatic.  Mouth/Throat: Oropharynx is clear and moist.  Eyes: EOM are normal. Pupils are equal, round, and reactive to light.  Neck: Normal range of motion. Neck supple.  Cardiovascular: Normal rate and regular rhythm.  Exam reveals no friction rub.   No murmur heard. Pulmonary/Chest: Effort normal and breath sounds normal. No respiratory distress. She has no wheezes. She has no rales.  Abdominal: Soft. She exhibits no distension. There is no tenderness. There is no rebound.  Musculoskeletal: Normal range of motion. She exhibits no edema.  Neurological: She is alert and oriented to person, place, and time.  Skin: No rash noted. She is not diaphoretic.  Nursing note and vitals reviewed.   ED Course  Procedures (including critical care time) Labs Review Labs Reviewed - No data to display  Imaging Review No results found.   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 Resolved now. This is the aura for her usual migraines, however it is usually in her R eye. No migraine now. Feels well. I offered migraine cocktail to prevent migraine from happening, but she refused IV. Meds given PO. Tramadol given also, as she normally takes those for her headaches. Neuro exam normal, doing well, relaxing comfortably. Stable for discharge.   Elwin MochaBlair Achsah Mcquade, MD 12/25/14 [phone]
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
## 2015-04-17 ENCOUNTER — Emergency Department (INDEPENDENT_AMBULATORY_CARE_PROVIDER_SITE_OTHER)
Admission: EM | Admit: 2015-04-17 | Discharge: 2015-04-17 | Disposition: A | Discharge status: Home / Self Care | Payer: Self-pay | Source: Home / Self Care | Attending: Emergency Medicine | Admitting: Emergency Medicine

## 2015-04-17 ENCOUNTER — Encounter (HOSPITAL_COMMUNITY): Payer: Self-pay | Admitting: *Deleted

## 2015-04-17 DIAGNOSIS — G43009 Migraine without aura, not intractable, without status migrainosus: Secondary | ICD-10-CM

## 2015-04-17 NOTE — ED Provider Notes (Signed)
CSN: [id number]     Arrival date & time 04/17/15  1427 History   First MD Initiated Contact with Patient 04/17/15 1549     Chief Complaint  Patient presents with  . Migraine   (Consider location/radiation/quality/duration/timing/severity/associated sxs/prior Treatment) HPI  She is a 19 year old woman here for migraine headaches. She states she has a history of migraine headaches. They used to occur quite frequently. This morning, she had her first migraine headache in over a year. She states it was right-sided, behind her eye. It was throbbing in nature. She had some mild associated nausea. No vision changes or photophobia. No focal numbness, tingling, weakness. She took some Goody powder, which did not help. The headache resolved on its own after several hours. She states Excedrin Migraine has worked well in the past. She is here today to make sure these headaches are normal.  Past Medical History  Diagnosis Date  . Migraines   . Concussion    History reviewed. No pertinent past surgical history. No family history on file. History  Substance Use Topics  . Smoking status: Never Smoker   . Smokeless tobacco: Not on file  . Alcohol Use: No   OB History    No data available     Review of Systems As in history of present illness Allergies  Review of patient's allergies indicates no known allergies.  Home Medications   Prior to Admission medications   Not on File   BP 118/75 mmHg  Pulse 70  Temp(Src) 99.2 F (37.3 C) (Oral)  Resp 16  SpO2 99% Physical Exam  Constitutional: She is oriented to person, place, and time. She appears well-developed and well-nourished. No distress.  Cardiovascular: Normal rate.   Pulmonary/Chest: Effort normal.  Neurological: She is alert and oriented to person, place, and time.    ED Course  Procedures (including critical care time) Labs Review Labs Reviewed - No data to display  Imaging Review No results found.   MDM   1. Migraine  without aura and without status migrainosus, not intractable    No headache currently. She describes a migraine headache. At this time, she is not a candidate for prophylactic therapy. We discussed possible triggers for migraine headaches. I offered a prescription for Imitrex, but she states she will stick with Excedrin Migraine for now. If her headaches become more frequent, she will follow-up with her PCP.    Charm Rings, MD 04/17/15 [phone]

## 2015-04-17 NOTE — Discharge Instructions (Signed)

## 2015-04-17 NOTE — ED Notes (Signed)
Pt denies any pain at this time.  States had migraine this morning, now resolved.  States she is here "to figure out why I keep getting these".  Has not seen nueorologist in past.

## 2016-02-17 ENCOUNTER — Emergency Department (HOSPITAL_COMMUNITY)
Admission: EM | Admit: 2016-02-17 | Discharge: 2016-02-17 | Disposition: A | Discharge status: Home / Self Care | Payer: Medicaid Other | Attending: Emergency Medicine | Admitting: Emergency Medicine

## 2016-02-17 ENCOUNTER — Encounter (HOSPITAL_COMMUNITY): Payer: Self-pay | Admitting: Emergency Medicine

## 2016-02-17 DIAGNOSIS — K08409 Partial loss of teeth, unspecified cause, unspecified class: Secondary | ICD-10-CM | POA: Insufficient documentation

## 2016-02-17 DIAGNOSIS — Z87828 Personal history of other (healed) physical injury and trauma: Secondary | ICD-10-CM | POA: Diagnosis not present

## 2016-02-17 DIAGNOSIS — K047 Periapical abscess without sinus: Secondary | ICD-10-CM | POA: Insufficient documentation

## 2016-02-17 DIAGNOSIS — Z8679 Personal history of other diseases of the circulatory system: Secondary | ICD-10-CM | POA: Diagnosis not present

## 2016-02-17 DIAGNOSIS — R220 Localized swelling, mass and lump, head: Secondary | ICD-10-CM | POA: Diagnosis present

## 2016-02-17 MED ORDER — CLINDAMYCIN HCL 150 MG PO CAPS
450.0000 mg | ORAL_CAPSULE | Freq: Four times a day (QID) | ORAL | Status: DC
Start: 2016-02-17 — End: 2016-08-23

## 2016-02-17 NOTE — Discharge Instructions (Signed)
Take your entire antibiotic prescription as prescribed 4 times daily for 7 days. I recommend continuing to take your pain medications prescribed to by her dentist as prescribed as needed for pain relief. Continue applying ice to affected area for 15-20 minutes 3-4 times daily to help with pain and swelling. Follow-up with your dentist in 3 days for wound follow-up. Return to the emergency department if symptoms worsen or new onset of fever, worsening facial swelling, difficulty swallowing resulting in drooling, unable to swallow, difficulty breathing, drainage, unable to open jaw to eat/drink.

## 2016-02-17 NOTE — ED Provider Notes (Signed)
CSN: [id number]     Arrival date & time 02/17/16  1023 History  By signing my name below, I, Tanda Rockers, attest that this documentation has been prepared under the direction and in the presence of Melburn Hake, PA-C. Electronically Signed: Tanda Rockers, ED Scribe. 02/17/2016. 12:38 PM.   Chief Complaint  Patient presents with  . Dental Problem    swelling onm l/side of face 3 days post extraction  . Facial Swelling   The history is provided by the patient. No language interpreter was used.     HPI Comments: Ebony Snyder is a 19 y.o. female who presents to the Emergency Department complaining of constant, worsening, swelling to bilateral lower jaw, worse on left, x 3 days after getting 6 teeth extracted. Pt reports that she had 4 wisdom teeth taken out and 2 others pulled as well. She believes that the majority of the teeth extracted were on her left side. Since getting her teeth pulled she has had increased swelling to her lower jaws. Pt also notes her left jaw feeling very firm and warm to the touch, causing her concern for possible infection. Her dentists office was closed today, prompting her to come to the ED instead. Pt was prescribed some pain medication but she cannot recall the name of it. She has been taking that as well as Ibuprofen with relief. She has also been applying ice to the areas without relief. Pt was not prescribed antibiotics after extraction. Denies drainage, sour taste in mouth, difficulty swallowing, fever, shortness of breath, drooling, or any other associated symptoms.    Past Medical History  Diagnosis Date  . Migraines   . Concussion    Past Surgical History  Procedure Laterality Date  . Mouth surgery     Family History  Problem Relation Age of Onset  . Hypertension Mother    Social History  Substance Use Topics  . Smoking status: Never Smoker   . Smokeless tobacco: None  . Alcohol Use: No   OB History    No data available     Review of  Systems  Constitutional: Negative for fever.  HENT: Positive for facial swelling. Negative for drooling and trouble swallowing.   Respiratory: Negative for shortness of breath.    Allergies  Review of patient's allergies indicates no known allergies.  Home Medications   Prior to Admission medications   Medication Sig Start Date End Date Taking? Authorizing Provider  clindamycin (CLEOCIN) 150 MG capsule Take 3 capsules (450 mg total) by mouth every 6 (six) hours. 02/17/16   Satira Sark Nadeau, PA-C   BP 141/85 mmHg  Pulse 74  Temp(Src) 98.4 F (36.9 C) (Oral)  Resp 16  SpO2 100%  LMP 02/17/2016 (Exact Date)   Physical Exam  Constitutional: She is oriented to person, place, and time. She appears well-developed and well-nourished.  HENT:  Head: Atraumatic.  Mouth/Throat: Uvula is midline, oropharynx is clear and moist and mucous membranes are normal. No oral lesions. No trismus in the jaw. No dental abscesses, uvula swelling or lacerations. No oropharyngeal exudate, posterior oropharyngeal edema, posterior oropharyngeal erythema or tonsillar abscesses.    Moderate swelling noted to left maxillary region with mild swelling of right maxillary region which pt reports has been present s/p teeth extraction.   Eyes: Conjunctivae and EOM are normal. Right eye exhibits no discharge. Left eye exhibits no discharge. No scleral icterus.  Neck: Normal range of motion. Neck supple.  Cardiovascular: Normal rate.   Pulmonary/Chest: Effort normal.  No stridor. No respiratory distress.  Lymphadenopathy:    She has no cervical adenopathy.  Neurological: She is alert and oriented to person, place, and time.  Skin: Skin is warm and dry.  Nursing note and vitals reviewed.   ED Course  Procedures (including critical care time)  DIAGNOSTIC STUDIES: Oxygen Saturation is 99% on RA, normal by my interpretation.    COORDINATION OF CARE: 12:11 PM-Discussed treatment plan which includes consulting  attending physician, Dr. Clarene DukeMcManus, with pt at bedside and pt agreed to plan.   12:12 PM - Dr. Clarene DukeMcManus suggests writing prescription for Clindamycin. She does not believe imaging is warranted at this time. Pt should also follow up with dentist as soon as possible.   Labs Review Labs Reviewed - No data to display  Imaging Review No results found.    EKG Interpretation None      MDM   Final diagnoses:  Dental infection   Patient presents w with facial swelling and pain status post teeth extraction that occurred 3 days ago. Patient denies currently being on any antibiotics. Denies fever. VSS. Exam revealed moderate swelling to left maxillary face and mild swelling to right maxillary region, moderate swelling and erythema noted to surrounding gingiva of tooth #18 induration, no fluctuance or drainage noted. I suspect patient's pain and swelling is likely due to dental infection without evidence of dental abscess present. No trismus, drooling, neck swelling, respiratory distress or stridor on exam. Plan to discharge patient home clindamycin. Advised patient to continue taking her pain meds and ibuprofen as prescribed and discussed symptomatically treatment. Advised patient to follow up with her dentist when their office is open in 3 days.   Evaluation does not show pathology requring ongoing emergent intervention or admission. Pt is hemodynamically stable and mentating appropriately. Discussed findings/results and plan with patient/guardian, who agrees with plan. All questions answered. Return precautions discussed and outpatient follow up given.    I personally performed the services described in this documentation, which was scribed in my presence. The recorded information has been reviewed and is accurate.      Satira Sarkicole Elizabeth BasyeNadeau, PA-C 02/17/16 1246  Samuel JesterKathleen McManus, DO 02/19/16 1428

## 2016-02-17 NOTE — ED Notes (Signed)
Pt reports increased swelling in both side of lower jaw 3 days after extraction of 6 teeth. L/side of face more swollen than right. Treated with oral rinse, ice packs and pain meds

## 2016-05-03 ENCOUNTER — Ambulatory Visit (HOSPITAL_COMMUNITY)
Admission: EM | Admit: 2016-05-03 | Discharge: 2016-05-03 | Disposition: A | Discharge status: Home / Self Care | Payer: Medicaid Other | Attending: Family Medicine | Admitting: Family Medicine

## 2016-05-03 ENCOUNTER — Encounter (HOSPITAL_COMMUNITY): Payer: Self-pay | Admitting: Emergency Medicine

## 2016-05-03 DIAGNOSIS — R05 Cough: Secondary | ICD-10-CM | POA: Insufficient documentation

## 2016-05-03 DIAGNOSIS — R059 Cough, unspecified: Secondary | ICD-10-CM

## 2016-05-03 DIAGNOSIS — Z8249 Family history of ischemic heart disease and other diseases of the circulatory system: Secondary | ICD-10-CM | POA: Insufficient documentation

## 2016-05-03 DIAGNOSIS — J069 Acute upper respiratory infection, unspecified: Secondary | ICD-10-CM | POA: Insufficient documentation

## 2016-05-03 LAB — POCT RAPID STREP A: STREPTOCOCCUS, GROUP A SCREEN (DIRECT): NEGATIVE

## 2016-05-03 MED ORDER — AZITHROMYCIN 250 MG PO TABS: ORAL_TABLET | ORAL | Status: DC

## 2016-05-03 MED ORDER — BENZONATATE 100 MG PO CAPS: 200.0000 mg | ORAL_CAPSULE | Freq: Three times a day (TID) | ORAL | Status: DC | PRN

## 2016-05-03 NOTE — ED Provider Notes (Signed)
CSN: [id number]     Arrival date & time 05/03/16  1301 History   None    Chief Complaint  Patient presents with  . URI   (Consider location/radiation/quality/duration/timing/severity/associated sxs/prior Treatment) Patient is a 20 y.o. female presenting with URI. The history is provided by the patient.  URI Presenting symptoms: congestion, cough, fatigue, rhinorrhea and sore throat   Severity:  Mild Onset quality:  Sudden Duration:  3 days Timing:  Constant Progression:  Waxing and waning Chronicity:  New Relieved by:  Nothing Worsened by:  Nothing tried Ineffective treatments:  None tried Associated symptoms: headaches, sinus pain, sneezing and swollen glands     Past Medical History  Diagnosis Date  . Migraines   . Concussion    Past Surgical History  Procedure Laterality Date  . Mouth surgery     Family History  Problem Relation Age of Onset  . Hypertension Mother    Social History  Substance Use Topics  . Smoking status: Never Smoker   . Smokeless tobacco: None  . Alcohol Use: No   OB History    No data available     Review of Systems  Constitutional: Positive for fatigue.  HENT: Positive for congestion, rhinorrhea, sneezing and sore throat.   Eyes: Negative.   Respiratory: Positive for cough.   Cardiovascular: Negative.   Gastrointestinal: Negative.   Endocrine: Negative.   Genitourinary: Negative.   Musculoskeletal: Negative.   Skin: Negative.   Allergic/Immunologic: Negative.   Neurological: Positive for headaches.  Hematological: Negative.   Psychiatric/Behavioral: Negative.     Allergies  Review of patient's allergies indicates no known allergies.  Home Medications   Prior to Admission medications   Medication Sig Start Date End Date Taking? Authorizing Provider  azithromycin (ZITHROMAX) 250 MG tablet Take 2 po first day and then one po qd x 4 days 05/03/16   Deatra CanterWilliam J Tamiya Colello, FNP  benzonatate (TESSALON) 100 MG capsule Take 2 capsules  (200 mg total) by mouth 3 (three) times daily as needed for cough. 05/03/16   Deatra CanterWilliam J Seann Genther, FNP  clindamycin (CLEOCIN) 150 MG capsule Take 3 capsules (450 mg total) by mouth every 6 (six) hours. 02/17/16   Barrett HenleNicole Elizabeth Nadeau, PA-C   Meds Ordered and Administered this Visit  Medications - No data to display  BP 111/72 mmHg  Pulse 80  Temp(Src) 97.9 F (36.6 C) (Oral)  Resp 16  SpO2 98%  LMP 04/19/2016 No data found.   Physical Exam  Constitutional: She appears well-developed and well-nourished.  HENT:  Head: Normocephalic and atraumatic.  Right Ear: External ear normal.  Left Ear: External ear normal.  Mouth/Throat: Oropharynx is clear and moist.  Eyes: Conjunctivae and EOM are normal. Pupils are equal, round, and reactive to light.  Neck: Normal range of motion. Neck supple.  Cardiovascular: Normal rate, regular rhythm and normal heart sounds.   Pulmonary/Chest: Effort normal and breath sounds normal.  Abdominal: Soft. Bowel sounds are normal.    ED Course  Procedures (including critical care time)  Labs Review Labs Reviewed  POCT RAPID STREP A    Imaging Review No results found.   Visual Acuity Review  Right Eye Distance:   Left Eye Distance:   Bilateral Distance:    Right Eye Near:   Left Eye Near:    Bilateral Near:         MDM   1. URI, acute   2. Cough    Zpak as directed Tessalon perles Push po fluids, rest,  tylenol and motrin otc prn as directed for fever, arthralgias, and myalgias.  Follow up prn if sx's continue or persist.    Deatra CanterWilliam J Annaleigha Woo, FNP 05/03/16 1416

## 2016-05-03 NOTE — Discharge Instructions (Signed)

## 2016-05-03 NOTE — ED Notes (Signed)
Here for cold sx onset Tuesday Sx today include chills, congestion, HA, ST, fevers, vomiting Taking OTC chloraseptic spray and ibup w/temp relief.  A&O x4... No acute distress.

## 2016-05-06 LAB — CULTURE, GROUP A STREP (THRC)

## 2016-08-23 ENCOUNTER — Encounter (HOSPITAL_COMMUNITY): Payer: Self-pay | Admitting: Emergency Medicine

## 2016-08-23 ENCOUNTER — Ambulatory Visit (HOSPITAL_COMMUNITY)
Admission: EM | Admit: 2016-08-23 | Discharge: 2016-08-23 | Disposition: A | Discharge status: Home / Self Care | Payer: Medicaid Other | Attending: Family Medicine | Admitting: Family Medicine

## 2016-08-23 DIAGNOSIS — J40 Bronchitis, not specified as acute or chronic: Secondary | ICD-10-CM

## 2016-08-23 MED ORDER — ALBUTEROL SULFATE HFA 108 (90 BASE) MCG/ACT IN AERS: 2.0000 | INHALATION_SPRAY | RESPIRATORY_TRACT | 5 refills | Status: DC | PRN

## 2016-08-23 MED ORDER — HYDROCODONE-HOMATROPINE 5-1.5 MG/5ML PO SYRP: 5.0000 mL | ORAL_SOLUTION | Freq: Four times a day (QID) | ORAL | 0 refills | Status: DC | PRN

## 2016-08-23 NOTE — ED Triage Notes (Signed)
Pt here for cold like sx onset 2 days associated w/dry cough, congestion, runny nose, SOB and CP due to cough  Denies fevers, chills  A&O x4... NAD

## 2016-08-23 NOTE — ED Provider Notes (Signed)
[facility]    CSN: [id number] Arrival date & time: 08/23/16  1541     History   Chief Complaint Chief Complaint  Patient presents with  . Shortness of Breath    HPI Ebony Snyder is a 20 y.o. female.   20 yo woman with 8 hours of cough.  She went to work at [employer] today but came home early. She's had a history of bronchitis but not ever been diagnosed with asthma. She's had no fever, no sore throat, no sinus congestion. She is a little bit short of breath with a tightness in her chest.  Which had the bronchitis in the past, she was given an inhaler and this seemed to really help her.      Past Medical History:  Diagnosis Date  . Concussion   . Migraines     There are no active problems to display for this patient.   Past Surgical History:  Procedure Laterality Date  . MOUTH SURGERY      OB History    No data available       Home Medications    Prior to Admission medications   Medication Sig Start Date End Date Taking? Authorizing Provider  albuterol (PROVENTIL HFA;VENTOLIN HFA) 108 (90 Base) MCG/ACT inhaler Inhale 2 puffs into the lungs every 4 (four) hours as needed for wheezing or shortness of breath (cough, shortness of breath or wheezing.). 08/23/16   Elvina SidleKurt Zavien Clubb, MD  HYDROcodone-homatropine [facility](HYCODAN) 5-1.5 MG/5ML syrup Take 5 mLs by mouth every 6 (six) hours as needed for cough. 08/23/16   Elvina SidleKurt Thursa Emme, MD    Family History Family History  Problem Relation Age of Onset  . Hypertension Mother     Social History Social History  Substance Use Topics  . Smoking status: Never Smoker  . Smokeless tobacco: Never Used  . Alcohol use No     Allergies   Review of patient's allergies indicates no known allergies.   Review of Systems Review of Systems  Constitutional: Negative.   HENT: Negative.   Respiratory: Positive for cough, chest tightness, shortness of breath and wheezing.   Cardiovascular: Negative.     Gastrointestinal: Negative.   Neurological: Negative.      Physical Exam Triage Vital Signs ED Triage Vitals  Enc Vitals Group     BP 08/23/16 1603 110/61     Pulse Rate 08/23/16 1603 106     Resp 08/23/16 1603 20     Temp 08/23/16 1603 98.7 F (37.1 C)     Temp Source 08/23/16 1603 Oral     SpO2 08/23/16 1603 97 %     Weight --      Height --      Head Circumference --      Peak Flow --      Pain Score 08/23/16 1605 5     Pain Loc --      Pain Edu? --      Excl. in GC? --    No data found.   Updated Vital Signs BP 110/61 (BP Location: Right Arm)   Pulse 106   Temp 98.7 F (37.1 C) (Oral)   Resp 20   LMP 08/06/2016   SpO2 97%   Physical Exam  Constitutional: She is oriented to person, place, and time. She appears well-developed and well-nourished.  HENT:  Head: Normocephalic.  Mouth/Throat: Oropharynx is clear and moist.  Eyes: Conjunctivae are normal. Pupils are equal, round, and reactive to light.  Neck: Normal  range of motion. Neck supple.  Cardiovascular: Normal rate, regular rhythm and normal heart sounds.   Pulmonary/Chest: Effort normal. She has wheezes.  Musculoskeletal: Normal range of motion.  Neurological: She is alert and oriented to person, place, and time.  Skin: Skin is warm and dry.  Nursing note and vitals reviewed.    UC Treatments / Results  Labs (all labs ordered are listed, but only abnormal results are displayed) Labs Reviewed - No data to display  EKG  EKG Interpretation None       Radiology No results found.  Procedures Procedures (including critical care time)  Medications Ordered in UC Medications - No data to display   Initial Impression / Assessment and Plan / UC Course  I have reviewed the triage vital signs and the nursing notes.  Pertinent labs & imaging results that were available during my care of the patient were reviewed by me and considered in my medical decision making (see chart for  details).  Clinical Course    Final Clinical Impressions(s) / UC Diagnoses   Final diagnoses:  Bronchitis    New Prescriptions New Prescriptions   ALBUTEROL (PROVENTIL HFA;VENTOLIN HFA) 108 (90 BASE) MCG/ACT INHALER    Inhale 2 puffs into the lungs every 4 (four) hours as needed for wheezing or shortness of breath (cough, shortness of breath or wheezing.).   HYDROCODONE-HOMATROPINE (HYCODAN) 5-1.5 MG/5ML SYRUP    Take 5 mLs by mouth every 6 (six) hours as needed for cough.     Elvina Sidle, MD 08/23/16 [phone]

## 2018-01-29 ENCOUNTER — Emergency Department (HOSPITAL_COMMUNITY): Admission: EM | Admit: 2018-01-29 | Discharge: 2018-01-29 | Discharge status: Absent without leave | Payer: Self-pay

## 2018-01-29 NOTE — ED Notes (Signed)
Called 3x, no response.  

## 2018-05-30 ENCOUNTER — Encounter (HOSPITAL_COMMUNITY): Payer: Self-pay

## 2018-05-30 ENCOUNTER — Ambulatory Visit (HOSPITAL_COMMUNITY)
Admission: EM | Admit: 2018-05-30 | Discharge: 2018-05-30 | Disposition: A | Discharge status: Home / Self Care | Payer: BLUE CROSS/BLUE SHIELD | Attending: Internal Medicine | Admitting: Internal Medicine

## 2018-05-30 DIAGNOSIS — L0501 Pilonidal cyst with abscess: Secondary | ICD-10-CM

## 2018-05-30 MED ORDER — DOXYCYCLINE HYCLATE 100 MG PO CAPS: 100.0000 mg | ORAL_CAPSULE | Freq: Two times a day (BID) | ORAL | 0 refills | Status: AC

## 2018-05-30 MED ORDER — IBUPROFEN 600 MG PO TABS: 600.0000 mg | ORAL_TABLET | Freq: Four times a day (QID) | ORAL | 0 refills | Status: DC | PRN

## 2018-05-30 NOTE — ED Triage Notes (Signed)
Pt presents with two abscesses on the top of her buttocks.

## 2018-05-30 NOTE — Discharge Instructions (Signed)
Please begin doxycycline for 10 days  Apply warm compresses/hot rags to area with massage to help soften skin  Return if symptoms returning or not improving  Use anti-inflammatories for pain/swelling. You may take up to 600-800 mg Ibuprofen every 8 hours with food. You may supplement Ibuprofen with Tylenol [phone] mg every 8 hours.

## 2018-05-31 NOTE — ED Provider Notes (Signed)
[facility]    CSN: [id number] Arrival date & time: 05/30/18  1925     History   Chief Complaint Chief Complaint  Patient presents with  . Abscess    HPI Ebony SCHILTZ is a 22 y.o. female negative past medical history presenting today for evaluation of possible abscess.  Patient states that over the past 4 to 5 days she has developed worsening pain in between her buttocks.  She states that initially she thought the pain was related to sitting on a hard chair for a long time, but symptoms continued to persist and worsen after sitting.  She denies any drainage.  She has not put anything on this area.  She has had increasingly difficulty sitting due to pain.  Denies previous abscesses.  States normal bowel movements.  HPI  Past Medical History:  Diagnosis Date  . Concussion   . Migraines     There are no active problems to display for this patient.   Past Surgical History:  Procedure Laterality Date  . MOUTH SURGERY      OB History   None      Home Medications    Prior to Admission medications   Medication Sig Start Date End Date Taking? Authorizing Provider  albuterol (PROVENTIL HFA;VENTOLIN HFA) 108 (90 Base) MCG/ACT inhaler Inhale 2 puffs into the lungs every 4 (four) hours as needed for wheezing or shortness of breath (cough, shortness of breath or wheezing.). 08/23/16   Elvina Sidle, MD  doxycycline (VIBRAMYCIN) 100 MG capsule Take 1 capsule (100 mg total) by mouth 2 (two) times daily for 10 days. 05/30/18 06/09/18  Brinklee Cisse C, PA-C  HYDROcodone-homatropine (HYCODAN) 5-1.5 MG/5ML syrup Take 5 mLs by mouth every 6 (six) hours as needed for cough. 08/23/16   Elvina Sidle, MD  ibuprofen (ADVIL,MOTRIN) 600 MG tablet Take 1 tablet (600 mg total) by mouth every 6 (six) hours as needed. 05/30/18   Dandy Lazaro, Junius Creamer, PA-C    Family History Family History  Problem Relation Age of Onset  . Hypertension Mother     Social History Social History    Tobacco Use  . Smoking status: Never Smoker  . Smokeless tobacco: Never Used  Substance Use Topics  . Alcohol use: No  . Drug use: No     Allergies   Patient has no known allergies.   Review of Systems Review of Systems  Constitutional: Negative for fatigue and fever.  HENT: Negative for mouth sores.   Eyes: Negative for visual disturbance.  Respiratory: Negative for shortness of breath.   Cardiovascular: Negative for chest pain.  Gastrointestinal: Negative for abdominal pain, blood in stool, nausea and vomiting.  Genitourinary: Negative for genital sores.  Musculoskeletal: Negative for arthralgias and joint swelling.  Skin: Negative for color change, rash and wound.  Neurological: Negative for dizziness, weakness, light-headedness and headaches.     Physical Exam Triage Vital Signs ED Triage Vitals  Enc Vitals Group     BP 05/30/18 2039 105/78     Pulse Rate 05/30/18 2039 87     Resp 05/30/18 2039 18     Temp 05/30/18 2039 98.9 F (37.2 C)     Temp Source 05/30/18 2039 Oral     SpO2 05/30/18 2039 100 %     Weight --      Height --      Head Circumference --      Peak Flow --      Pain Score 05/30/18 2042 10  Pain Loc --      Pain Edu? --      Excl. in GC? --    No data found.  Updated Vital Signs BP 105/78 (BP Location: Left Arm)   Pulse 87   Temp 98.9 F (37.2 C) (Oral)   Resp 18   LMP 05/09/2018   SpO2 100%   Visual Acuity Right Eye Distance:   Left Eye Distance:   Bilateral Distance:    Right Eye Near:   Left Eye Near:    Bilateral Near:     Physical Exam  Constitutional: She is oriented to person, place, and time. She appears well-developed and well-nourished.  No acute distress  HENT:  Head: Normocephalic and atraumatic.  Nose: Nose normal.  Eyes: Conjunctivae are normal.  Neck: Neck supple.  Cardiovascular: Normal rate.  Pulmonary/Chest: Effort normal. No respiratory distress.  Abdominal: She exhibits no distension.    Musculoskeletal: Normal range of motion.  Neurological: She is alert and oriented to person, place, and time.  Skin: Skin is warm and dry.  Mild amount of induration near apex of gluteal cleft cleft, no fluctuance or discoloration, does not extend near the rectum  Psychiatric: She has a normal mood and affect.  Nursing note and vitals reviewed.    UC Treatments / Results  Labs (all labs ordered are listed, but only abnormal results are displayed) Labs Reviewed - No data to display  EKG None  Radiology No results found.  Procedures Procedures (including critical care time)  Medications Ordered in UC Medications - No data to display  Initial Impression / Assessment and Plan / UC Course  I have reviewed the triage vital signs and the nursing notes.  Pertinent labs & imaging results that were available during my care of the patient were reviewed by me and considered in my medical decision making (see chart for details).     Patient with early pilonidal abscess, does not appear drainable at this time, will initiate on doxycycline.  Warm compresses/soaks.  Follow-up if symptoms persisting worsening, advised may require drainage later.  Tylenol and ibuprofen for pain.Discussed strict return precautions. Patient verbalized understanding and is agreeable with plan.  Final Clinical Impressions(s) / UC Diagnoses   Final diagnoses:  Pilonidal abscess     Discharge Instructions     Please begin doxycycline for 10 days  Apply warm compresses/hot rags to area with massage to help soften skin  Return if symptoms returning or not improving  Use anti-inflammatories for pain/swelling. You may take up to 600-800 mg Ibuprofen every 8 hours with food. You may supplement Ibuprofen with Tylenol [phone] mg every 8 hours.     ED Prescriptions    Medication Sig Dispense Auth. Provider   doxycycline (VIBRAMYCIN) 100 MG capsule Take 1 capsule (100 mg total) by mouth 2 (two) times daily  for 10 days. 20 capsule Yaacov Koziol C, PA-C   ibuprofen (ADVIL,MOTRIN) 600 MG tablet Take 1 tablet (600 mg total) by mouth every 6 (six) hours as needed. 30 tablet Chester Romero, CrownHallie C, PA-C     Controlled Substance Prescriptions [place] Controlled Substance Registry consulted? Not Applicable   Lew DawesWieters, Nancylee Gaines C, [place]PA-C 05/31/18 1004

## 2018-06-02 ENCOUNTER — Emergency Department (HOSPITAL_COMMUNITY)
Admission: EM | Admit: 2018-06-02 | Discharge: 2018-06-02 | Disposition: A | Discharge status: Home / Self Care | Payer: BLUE CROSS/BLUE SHIELD | Attending: Emergency Medicine | Admitting: Emergency Medicine

## 2018-06-02 ENCOUNTER — Encounter (HOSPITAL_COMMUNITY): Payer: Self-pay

## 2018-06-02 ENCOUNTER — Emergency Department (HOSPITAL_COMMUNITY): Payer: BLUE CROSS/BLUE SHIELD

## 2018-06-02 DIAGNOSIS — L0231 Cutaneous abscess of buttock: Secondary | ICD-10-CM | POA: Diagnosis present

## 2018-06-02 DIAGNOSIS — L03317 Cellulitis of buttock: Secondary | ICD-10-CM | POA: Diagnosis not present

## 2018-06-02 DIAGNOSIS — L0501 Pilonidal cyst with abscess: Secondary | ICD-10-CM | POA: Insufficient documentation

## 2018-06-02 LAB — CBC WITH DIFFERENTIAL/PLATELET
ABS IMMATURE GRANULOCYTES: 0.1 10*3/uL (ref 0.0–0.1)
BASOS ABS: 0 10*3/uL (ref 0.0–0.1)
Basophils Relative: 0 %
EOS PCT: 0 %
Eosinophils Absolute: 0.1 10*3/uL (ref 0.0–0.7)
HCT: 38 % (ref 36.0–46.0)
Hemoglobin: 12 g/dL (ref 12.0–15.0)
Immature Granulocytes: 0 %
Lymphocytes Relative: 6 %
Lymphs Abs: 1 10*3/uL (ref 0.7–4.0)
MCH: 29.1 pg (ref 26.0–34.0)
MCHC: 31.6 g/dL (ref 30.0–36.0)
MCV: 92.2 fL (ref 78.0–100.0)
MONO ABS: 1.3 10*3/uL — AB (ref 0.1–1.0)
MONOS PCT: 8 %
Neutro Abs: 13.3 10*3/uL — ABNORMAL HIGH (ref 1.7–7.7)
Neutrophils Relative %: 84 %
Platelets: 217 10*3/uL (ref 150–400)
RBC: 4.12 MIL/uL (ref 3.87–5.11)
RDW: 12.5 % (ref 11.5–15.5)
WBC: 15.8 10*3/uL — ABNORMAL HIGH (ref 4.0–10.5)

## 2018-06-02 LAB — BASIC METABOLIC PANEL
Anion gap: 10 (ref 5–15)
BUN: 7 mg/dL (ref 6–20)
CO2: 22 mmol/L (ref 22–32)
CREATININE: 0.67 mg/dL (ref 0.44–1.00)
Calcium: 9 mg/dL (ref 8.9–10.3)
Chloride: 107 mmol/L (ref 98–111)
GFR calc Af Amer: >60 mL/min (ref >60)
GFR calc non Af Amer: >60 mL/min (ref >60)
GLUCOSE: 106 mg/dL — AB (ref 70–99)
POTASSIUM: 3.5 mmol/L (ref 3.5–5.1)
SODIUM: 139 mmol/L (ref 135–145)

## 2018-06-02 LAB — I-STAT BETA HCG BLOOD, ED (MC, WL, AP ONLY): I-stat hCG, quantitative: <5 m[IU]/mL (ref <5)

## 2018-06-02 LAB — I-STAT CG4 LACTIC ACID, ED: LACTIC ACID, VENOUS: 0.72 mmol/L (ref 0.5–1.9)

## 2018-06-02 MED ORDER — IOHEXOL 300 MG/ML  SOLN
100.0000 mL | Freq: Once | INTRAMUSCULAR | Status: AC | PRN
  Administered 2018-06-02: 100 mL via INTRAVENOUS

## 2018-06-02 MED ORDER — CLINDAMYCIN PHOSPHATE 900 MG/50ML IV SOLN
900.0000 mg | Freq: Once | INTRAVENOUS | Status: AC
  Administered 2018-06-02: 900 mg via INTRAVENOUS
  Filled 2018-06-02: qty 50

## 2018-06-02 MED ORDER — KETOROLAC TROMETHAMINE 15 MG/ML IJ SOLN
15.0000 mg | Freq: Once | INTRAMUSCULAR | Status: AC
  Administered 2018-06-02: 15 mg via INTRAVENOUS
  Filled 2018-06-02: qty 1

## 2018-06-02 MED ORDER — OXYCODONE-ACETAMINOPHEN 5-325 MG PO TABS
2.0000 | ORAL_TABLET | Freq: Once | ORAL | Status: AC
  Administered 2018-06-02: 2 via ORAL
  Filled 2018-06-02: qty 2

## 2018-06-02 MED ORDER — ONDANSETRON 4 MG PO TBDP: 4.0000 mg | ORAL_TABLET | Freq: Three times a day (TID) | ORAL | 0 refills | Status: DC | PRN

## 2018-06-02 MED ORDER — LIDOCAINE-EPINEPHRINE (PF) 2 %-1:200000 IJ SOLN
10.0000 mL | Freq: Once | INTRAMUSCULAR | Status: AC
  Administered 2018-06-02: 10 mL via INTRADERMAL
  Filled 2018-06-02: qty 20

## 2018-06-02 MED ORDER — OXYCODONE-ACETAMINOPHEN 5-325 MG PO TABS: 1.0000 | ORAL_TABLET | ORAL | 0 refills | Status: DC | PRN

## 2018-06-02 MED ORDER — HYDROMORPHONE HCL 1 MG/ML IJ SOLN
1.0000 mg | Freq: Once | INTRAMUSCULAR | Status: AC
  Administered 2018-06-02: 1 mg via INTRAVENOUS
  Filled 2018-06-02: qty 1

## 2018-06-02 MED ORDER — CLINDAMYCIN HCL 300 MG PO CAPS: 300.0000 mg | ORAL_CAPSULE | Freq: Four times a day (QID) | ORAL | 0 refills | Status: AC

## 2018-06-02 MED ORDER — SODIUM CHLORIDE 0.9 % IV BOLUS
1000.0000 mL | Freq: Once | INTRAVENOUS | Status: AC
  Administered 2018-06-02: 1000 mL via INTRAVENOUS

## 2018-06-02 NOTE — ED Triage Notes (Signed)
Pt states that she has two abscess on the top of her buttocks, seen at UC three days, placed on antibiotics, states one just opened, pt tearful in triage.

## 2018-06-02 NOTE — Discharge Instructions (Signed)
As we discussed, I have prescribed you a different antibiotic to help fight the infection.  Now that the abscess is drained, the infection should improve.  If, however, your pain worsens, you develop recurrent fevers, or the area of pain and redness spreads after 24 hours on the new antibiotics, return to the ER for admission.  Return to the ER in 48 hours as well for wound check and packing removal.  It is okay to take showers but do not bathe or swim.  Take the antibiotics as prescribed.

## 2018-06-02 NOTE — ED Provider Notes (Signed)
[facility] EMERGENCY DEPARTMENT Provider Note   CSN: [id number] Arrival date & time: 06/02/18  4098     History   Chief Complaint Chief Complaint  Patient presents with  . Abscess    HPI ADRIANE GABBERT is a 22 y.o. female.  HPI    22 year old female with history of migraines here with pain in the buttocks.  The patient states that her symptoms began several days ago.  She believes it was from sitting on a hard chair at work.  Over the last several days, she has had progressively worsening aching, throbbing, gluteal pain.  She said associated subjective chills.  She was seen in urgent care on 7/26 and placed on doxycycline.  She is been taking this and using warm compresses.  Over the last 24 hours, she feels like her swelling and pain is gotten worse and has now spread to the right side of her buttocks.  She said associated fever and chills.  No nausea or vomiting.  She is been taking her antibiotics as prescribed.  Pain is worse with weightbearing or palpation.  No alleviating factors.  Past Medical History:  Diagnosis Date  . Concussion   . Migraines     There are no active problems to display for this patient.   Past Surgical History:  Procedure Laterality Date  . MOUTH SURGERY       OB History   None      Home Medications    Prior to Admission medications   Medication Sig Start Date End Date Taking? Authorizing Provider  acetaminophen (TYLENOL) 325 MG tablet Take 325 mg by mouth every 6 (six) hours as needed for mild pain.   Yes [provider]  doxycycline (VIBRAMYCIN) 100 MG capsule Take 1 capsule (100 mg total) by mouth 2 (two) times daily for 10 days. 05/30/18 06/09/18 Yes Wieters, Hallie C, PA-C  ibuprofen (ADVIL,MOTRIN) 600 MG tablet Take 1 tablet (600 mg total) by mouth every 6 (six) hours as needed. Patient taking differently: Take 600 mg by mouth every 6 (six) hours as needed for moderate pain.  05/30/18  Yes Wieters, Hallie C,  PA-C  albuterol (PROVENTIL HFA;VENTOLIN HFA) 108 (90 Base) MCG/ACT inhaler Inhale 2 puffs into the lungs every 4 (four) hours as needed for wheezing or shortness of breath (cough, shortness of breath or wheezing.). Patient not taking: Reported on 06/02/2018 08/23/16   Elvina Sidle, MD  clindamycin (CLEOCIN) 300 MG capsule Take 1 capsule (300 mg total) by mouth 4 (four) times daily for 10 days. 06/02/18 06/12/18  Shaune Pollack, MD  HYDROcodone-homatropine [facility]) 5-1.5 MG/5ML syrup Take 5 mLs by mouth every 6 (six) hours as needed for cough. Patient not taking: Reported on 06/02/2018 08/23/16   Elvina Sidle, MD  ondansetron (ZOFRAN ODT) 4 MG disintegrating tablet Take 1 tablet (4 mg total) by mouth every 8 (eight) hours as needed for nausea or vomiting. 06/02/18   Shaune Pollack, MD  oxyCODONE-acetaminophen (PERCOCET/ROXICET) 5-325 MG tablet Take 1-2 tablets by mouth every 4 (four) hours as needed for moderate pain or severe pain. 06/02/18   Shaune Pollack, MD    Family History Family History  Problem Relation Age of Onset  . Hypertension Mother     Social History Social History   Tobacco Use  . Smoking status: Never Smoker  . Smokeless tobacco: Never Used  Substance Use Topics  . Alcohol use: No  . Drug use: No     Allergies   Patient has  no known allergies.   Review of Systems Review of Systems  Constitutional: Positive for fatigue. Negative for chills and fever.  HENT: Negative for congestion, rhinorrhea and sore throat.   Eyes: Negative for visual disturbance.  Respiratory: Negative for cough, shortness of breath and wheezing.   Cardiovascular: Negative for chest pain and leg swelling.  Gastrointestinal: Negative for abdominal pain, diarrhea, nausea and vomiting.  Genitourinary: Negative for dysuria, flank pain, vaginal bleeding and vaginal discharge.  Musculoskeletal: Negative for neck pain.  Skin: Positive for wound. Negative for rash.  Allergic/Immunologic:  Negative for immunocompromised state.  Neurological: Positive for weakness. Negative for syncope and headaches.  Hematological: Does not bruise/bleed easily.  All other systems reviewed and are negative.    Physical Exam Updated Vital Signs BP (!) 110/95 (BP Location: Right Arm)   Pulse 91   Temp 99 F (37.2 C) (Oral)   Resp 17   LMP 05/09/2018   SpO2 100%   Physical Exam  Constitutional: She is oriented to person, place, and time. She appears well-developed and well-nourished. No distress.  HENT:  Head: Normocephalic and atraumatic.  Eyes: Conjunctivae are normal.  Neck: Neck supple.  Cardiovascular: Normal rate, regular rhythm and normal heart sounds. Exam reveals no friction rub.  No murmur heard. Pulmonary/Chest: Effort normal and breath sounds normal. No respiratory distress. She has no wheezes. She has no rales.  Abdominal: She exhibits no distension.  Genitourinary:  Genitourinary Comments: Approx 5 x 5 cm area of fluctuance and induration along superior gluteal cleft, worse on R>L buttocks just lateral to midline, with surrounding erythema and induration.  Musculoskeletal: She exhibits no edema.  Neurological: She is alert and oriented to person, place, and time. She exhibits normal muscle tone.  Skin: Skin is warm. Capillary refill takes less than 2 seconds.  Psychiatric: She has a normal mood and affect.  Nursing note and vitals reviewed.    ED Treatments / Results  Labs (all labs ordered are listed, but only abnormal results are displayed) Labs Reviewed  CBC WITH DIFFERENTIAL/PLATELET - Abnormal; Notable for the following components:      Result Value   WBC 15.8 (*)    Neutro Abs 13.3 (*)    Monocytes Absolute 1.3 (*)    All other components within normal limits  BASIC METABOLIC PANEL - Abnormal; Notable for the following components:   Glucose, Bld 106 (*)    All other components within normal limits  AEROBIC CULTURE (SUPERFICIAL SPECIMEN)  CULTURE,  BLOOD (ROUTINE X 2)  CULTURE, BLOOD (ROUTINE X 2)  I-STAT CG4 LACTIC ACID, ED  I-STAT BETA HCG BLOOD, ED ([facility], [facility], [facility] ONLY)  I-STAT CG4 LACTIC ACID, ED    EKG None  Radiology Ct Pelvis W Contrast  Result Date: 06/02/2018 CLINICAL DATA:  Painful buttocks abscess for 3 days.  Fever. EXAM: CT PELVIS WITH CONTRAST TECHNIQUE: Multidetector CT imaging of the pelvis was performed using the standard protocol following the bolus administration of intravenous contrast. CONTRAST:  OMNIPAQUE IOHEXOL 300 MG/ML  SOLN COMPARISON:  None. FINDINGS: Urinary Tract: The visualized distal ureters and bladder appear unremarkable. Bowel: No bowel wall thickening, distention or surrounding inflammation identified within the pelvis. The appendix appears normal. No perirectal inflammatory changes are seen. Vascular/Lymphatic: No enlarged pelvic lymph nodes identified. There are probable reactive inguinal lymph nodes bilaterally. No significant vascular findings. Reproductive: The uterus and ovaries appear unremarkable. No adnexal mass. Other: There are inflammatory changes within the subcutaneous fat posterior to the coccyx. There is  an associated hyperdense component measuring up to 17 mm on sagittal image 76, and mild surrounding soft tissue emphysema. No presacral inflammatory changes or focal fluid collections are seen. This process abuts the coccyx, but there is no cortical destruction to suggest osteomyelitis. Musculoskeletal: There is no evidence of sacral coccygeal cortical destruction. The sacroiliac joints and hip joints appear unremarkable. IMPRESSION: 1. Inflammatory changes within the subcutaneous fat posterior to the coccyx, likely infected pilonidal sinus. Associated hyperdense component could reflect a foreign body or sequela of attempted treatment. No drainable abscess identified. 2. This process abuts the coccyx, but there are no signs of osteomyelitis or septic joint. Electronically Signed   By: Carey BullocksWilliam   Veazey M.D.   On: 06/02/2018 09:29    Procedures .Marland Kitchen.Incision and Drainage Date/Time: 06/02/2018 7:28 AM Performed by: Shaune PollackIsaacs, Khary Schaben, MD Authorized by: Shaune PollackIsaacs, Osamu Olguin, MD   Consent:    Consent obtained:  Verbal   Consent given by:  Patient   Risks discussed:  Bleeding, damage to other organs, incomplete drainage, infection and pain   Alternatives discussed:  Alternative treatment and delayed treatment Location:    Type:  Abscess (Pilonidal abscess)   Size:  5 x 5   Location:  Anogenital   Anogenital location:  Gluteal cleft Pre-procedure details:    Skin preparation:  Betadine Anesthesia (see MAR for exact dosages):    Anesthesia method:  Local infiltration   Local anesthetic:  Lidocaine 1% WITH epi Procedure type:    Complexity:  Simple Procedure details:    Incision types:  Single straight   Incision depth:  Dermal   Scalpel blade:  11   Wound management:  Probed and deloculated and irrigated with saline   Drainage:  Purulent   Drainage amount:  Copious   Wound treatment:  Wound left open   Packing materials:  1/4 in gauze Post-procedure details:    Patient tolerance of procedure:  Tolerated well, no immediate complications   (including critical care time)  Medications Ordered in ED Medications  sodium chloride 0.9 % bolus 1,000 mL (0 mLs Intravenous Stopped 06/02/18 0938)  lidocaine-EPINEPHrine (XYLOCAINE W/EPI) 2 %-1:200000 (PF) injection 10 mL (10 mLs Intradermal Given 06/02/18 0807)  HYDROmorphone (DILAUDID) injection 1 mg (1 mg Intravenous Given 06/02/18 0806)  ketorolac (TORADOL) 15 MG/ML injection 15 mg (15 mg Intravenous Given 06/02/18 0806)  clindamycin (CLEOCIN) IVPB 900 mg (0 mg Intravenous Stopped 06/02/18 0932)  iohexol (OMNIPAQUE) 300 MG/ML solution 100 mL (100 mLs Intravenous Contrast Given 06/02/18 0904)  oxyCODONE-acetaminophen (PERCOCET/ROXICET) 5-325 MG per tablet 2 tablet (2 tablets Oral Given 06/02/18 0956)     Initial Impression / Assessment and  Plan / ED Course  I have reviewed the triage vital signs and the nursing notes.  Pertinent labs & imaging results that were available during my care of the patient were reviewed by me and considered in my medical decision making (see chart for details).  Clinical Course as of Jun 02 1013  Mon Jun 02, 2018  [id number] 22 year old female here with pilonidal abscess with surrounding cellulitis.  Patient febrile, borderline hypotensive on arrival.  She is nontoxic on exam, however.  Suspect this is failed outpatient antibiotics due to abscess being present.  Will give empiric dose of clindamycin, send blood cultures, and obtain CT pelvis given her fever and worsening despite antibiotics.   [CI]    Clinical Course User Index [CI] Shaune PollackIsaacs, Marshia Tropea, MD    Abscess I indeed by myself.  Patient tolerated well and is now pain-free.  Repeat temperature is within normal limits.  CT scan shows significant surrounding cellulitis but no evidence of appreciable remaining abscess.  The hyperdense area noted in CT corresponds with the packing placed.  The abscess has been appropriately drained.  There is note of edema extending to the coccyx, but no mention of osteomyelitis.  Her pain is completely resolved after abscess I&D.  Lactic acid is normal.  No evidence of severe sepsis.  I discussed management options with the patient in detail.  Given that she was Artie on antibiotics, discussed admission for observation versus continued outpatient attempt of treatment now that the abscesses been drained.  Patient states she feels much better and would like to attempt outpatient management.  I discussed that her infection could worsen, in which case she should return to the ER immediately for admission.  Return precautions were given in detail.  She will return in 4 8 hours for wound check and packing removal.  Final Clinical Impressions(s) / ED Diagnoses   Final diagnoses:  Pilonidal cyst with abscess  Cellulitis, gluteal     ED Discharge Orders        Ordered    clindamycin (CLEOCIN) 300 MG capsule  4 times daily     06/02/18 0956    oxyCODONE-acetaminophen (PERCOCET/ROXICET) 5-325 MG tablet  Every 4 hours PRN     06/02/18 0956    ondansetron (ZOFRAN ODT) 4 MG disintegrating tablet  Every 8 hours PRN     06/02/18 0956       Shaune Pollack, MD 06/02/18 1014

## 2018-06-04 ENCOUNTER — Ambulatory Visit (HOSPITAL_COMMUNITY)
Admission: EM | Admit: 2018-06-04 | Discharge: 2018-06-04 | Disposition: A | Discharge status: Home / Self Care | Payer: BLUE CROSS/BLUE SHIELD

## 2018-06-04 ENCOUNTER — Other Ambulatory Visit: Payer: Self-pay

## 2018-06-04 ENCOUNTER — Encounter (HOSPITAL_COMMUNITY): Payer: Self-pay | Admitting: Emergency Medicine

## 2018-06-04 DIAGNOSIS — L0501 Pilonidal cyst with abscess: Secondary | ICD-10-CM | POA: Diagnosis not present

## 2018-06-04 DIAGNOSIS — Z4800 Encounter for change or removal of nonsurgical wound dressing: Secondary | ICD-10-CM | POA: Diagnosis not present

## 2018-06-04 DIAGNOSIS — Z5189 Encounter for other specified aftercare: Secondary | ICD-10-CM

## 2018-06-04 LAB — AEROBIC CULTURE  (SUPERFICIAL SPECIMEN)
Culture: NORMAL
Gram Stain: NONE SEEN

## 2018-06-04 LAB — AEROBIC CULTURE W GRAM STAIN (SUPERFICIAL SPECIMEN)

## 2018-06-04 NOTE — ED Provider Notes (Signed)
[facility]    CSN: [id number] Arrival date & time: 06/04/18  0911     History   Chief Complaint Chief Complaint  Patient presents with  . Wound Check    HPI Ebony Snyder is a 22 y.o. female.   Patient is a 22 year old female presents for recheck of abscess to buttocks.  She was seen in the ER on 06/02/2018 and had her abscess drained and packed. She reports decreased pain from the wound and she believes that it is healing. She has been using her percocet and ibuprofen for pain. She denies any fever, chills, body aches, nausea, weakness.   ROS per HPI      Past Medical History:  Diagnosis Date  . Concussion   . Migraines     There are no active problems to display for this patient.   Past Surgical History:  Procedure Laterality Date  . MOUTH SURGERY      OB History   None      Home Medications    Prior to Admission medications   Medication Sig Start Date End Date Taking? Authorizing Provider  clindamycin (CLEOCIN) 300 MG capsule Take 1 capsule (300 mg total) by mouth 4 (four) times daily for 10 days. 06/02/18 06/12/18 Yes Shaune PollackIsaacs, Cameron, MD  ibuprofen (ADVIL,MOTRIN) 600 MG tablet Take 1 tablet (600 mg total) by mouth every 6 (six) hours as needed. Patient taking differently: Take 600 mg by mouth every 6 (six) hours as needed for moderate pain.  05/30/18  Yes Wieters, Hallie C, PA-C  oxyCODONE-acetaminophen (PERCOCET/ROXICET) 5-325 MG tablet Take 1-2 tablets by mouth every 4 (four) hours as needed for moderate pain or severe pain. 06/02/18  Yes Shaune PollackIsaacs, Cameron, MD  acetaminophen (TYLENOL) 325 MG tablet Take 325 mg by mouth every 6 (six) hours as needed for mild pain.    [provider]  albuterol (PROVENTIL HFA;VENTOLIN HFA) 108 (90 Base) MCG/ACT inhaler Inhale 2 puffs into the lungs every 4 (four) hours as needed for wheezing or shortness of breath (cough, shortness of breath or wheezing.). Patient not taking: Reported on 06/02/2018 08/23/16    Elvina SidleLauenstein, Kurt, MD  doxycycline (VIBRAMYCIN) 100 MG capsule Take 1 capsule (100 mg total) by mouth 2 (two) times daily for 10 days. 05/30/18 06/09/18  Wieters, Hallie C, PA-C  HYDROcodone-homatropine (HYCODAN) 5-1.5 MG/5ML syrup Take 5 mLs by mouth every 6 (six) hours as needed for cough. Patient not taking: Reported on 06/02/2018 08/23/16   Elvina SidleLauenstein, Kurt, MD  ondansetron (ZOFRAN ODT) 4 MG disintegrating tablet Take 1 tablet (4 mg total) by mouth every 8 (eight) hours as needed for nausea or vomiting. 06/02/18   Shaune PollackIsaacs, Cameron, MD    Family History Family History  Problem Relation Age of Onset  . Hypertension Mother     Social History Social History   Tobacco Use  . Smoking status: Never Smoker  . Smokeless tobacco: Never Used  Substance Use Topics  . Alcohol use: No  . Drug use: No     Allergies   Patient has no known allergies.   Review of Systems Review of Systems   Physical Exam Triage Vital Signs ED Triage Vitals  Enc Vitals Group     BP 06/04/18 0944 122/81     Pulse Rate 06/04/18 0944 64     Resp 06/04/18 0944 16     Temp 06/04/18 0944 97.7 F (36.5 C)     Temp Source 06/04/18 0944 Oral     SpO2 06/04/18 0944  99 %     Weight --      Height --      Head Circumference --      Peak Flow --      Pain Score 06/04/18 0957 2     Pain Loc --      Pain Edu? --      Excl. in GC? --    No data found.  Updated Vital Signs BP 122/81 (BP Location: Left Arm)   Pulse 64   Temp 97.7 F (36.5 C) (Oral)   Resp 16   LMP 06/04/2018 (Approximate)   SpO2 99%   Visual Acuity Right Eye Distance:   Left Eye Distance:   Bilateral Distance:    Right Eye Near:   Left Eye Near:    Bilateral Near:     Physical Exam  Constitutional: She is oriented to person, place, and time. She appears well-developed and well-nourished.  HENT:  Head: Normocephalic and atraumatic.  Pulmonary/Chest: Effort normal.  Neurological: She is alert and oriented to person, place, and  time.  Skin: Skin is warm and dry. Capillary refill takes less than 2 seconds.  Healing pilonidal cyst to gluteal cleft. Packing removed.   Psychiatric: She has a normal mood and affect.  Nursing note and vitals reviewed.    UC Treatments / Results  Labs (all labs ordered are listed, but only abnormal results are displayed) Labs Reviewed - No data to display  EKG None  Radiology No results found.  Procedures Procedures (including critical care time)  Medications Ordered in UC Medications - No data to display  Initial Impression / Assessment and Plan / UC Course  I have reviewed the triage vital signs and the nursing notes.  Pertinent labs & imaging results that were available during my care of the patient were reviewed by me and considered in my medical decision making (see chart for details).     Wound appears to be healing. Packing removed. Instructed to finish the abx and continue ibuprofen for pain. Follow up as needed.  Final Clinical Impressions(s) / UC Diagnoses   Final diagnoses:  Visit for wound check     Discharge Instructions     It was nice meeting you!!  The abscess appears to be healing.  Make sure to finish your antibiotics.  Keep covered it may continue to drain.  Pain medication as needed.     ED Prescriptions    None     Controlled Substance Prescriptions [place] Controlled Substance Registry consulted? Not Applicable   Janace Aris, NP 06/04/18 1040

## 2018-06-04 NOTE — Discharge Instructions (Addendum)
It was nice meeting you!!  The abscess appears to be healing.  Make sure to finish your antibiotics.  Keep covered it may continue to drain.  Pain medication as needed.

## 2018-06-04 NOTE — ED Triage Notes (Signed)
Pt here for packing removal and wound check of an abscess to her upper buttocks.  Pt states the pain has been getting better, but she still has some minor drainage from the site.  She denies any fever.

## 2018-06-07 LAB — CULTURE, BLOOD (ROUTINE X 2)
Culture: NO GROWTH
Culture: NO GROWTH
Special Requests: ADEQUATE
Special Requests: ADEQUATE

## 2019-04-05 ENCOUNTER — Encounter (HOSPITAL_COMMUNITY): Payer: Self-pay | Admitting: Emergency Medicine

## 2019-04-05 ENCOUNTER — Emergency Department (HOSPITAL_COMMUNITY)
Admission: EM | Admit: 2019-04-05 | Discharge: 2019-04-05 | Disposition: A | Discharge status: Home / Self Care | Payer: BLUE CROSS/BLUE SHIELD | Attending: Emergency Medicine | Admitting: Emergency Medicine

## 2019-04-05 ENCOUNTER — Other Ambulatory Visit: Payer: Self-pay

## 2019-04-05 DIAGNOSIS — G43009 Migraine without aura, not intractable, without status migrainosus: Secondary | ICD-10-CM | POA: Insufficient documentation

## 2019-04-05 MED ORDER — DEXAMETHASONE SODIUM PHOSPHATE 10 MG/ML IJ SOLN
10.0000 mg | Freq: Once | INTRAMUSCULAR | Status: AC
  Administered 2019-04-05: 10 mg via INTRAVENOUS
  Filled 2019-04-05: qty 1

## 2019-04-05 MED ORDER — SODIUM CHLORIDE 0.9 % IV BOLUS
1000.0000 mL | Freq: Once | INTRAVENOUS | Status: AC
  Administered 2019-04-05: 1000 mL via INTRAVENOUS

## 2019-04-05 MED ORDER — DIPHENHYDRAMINE HCL 50 MG/ML IJ SOLN
25.0000 mg | Freq: Once | INTRAMUSCULAR | Status: AC
  Administered 2019-04-05: 25 mg via INTRAVENOUS
  Filled 2019-04-05: qty 1

## 2019-04-05 MED ORDER — KETOROLAC TROMETHAMINE 30 MG/ML IJ SOLN
30.0000 mg | Freq: Once | INTRAMUSCULAR | Status: AC
  Administered 2019-04-05: 30 mg via INTRAVENOUS
  Filled 2019-04-05: qty 1

## 2019-04-05 MED ORDER — VALPROATE SODIUM 500 MG/5ML IV SOLN
500.0000 mg | Freq: Once | INTRAVENOUS | Status: DC
  Filled 2019-04-05: qty 5

## 2019-04-05 MED ORDER — PROCHLORPERAZINE EDISYLATE 10 MG/2ML IJ SOLN
10.0000 mg | Freq: Once | INTRAMUSCULAR | Status: AC
  Administered 2019-04-05: 10 mg via INTRAVENOUS
  Filled 2019-04-05: qty 2

## 2019-04-05 NOTE — ED Provider Notes (Addendum)
[facility] EMERGENCY DEPARTMENT Provider Note   CSN: [id number] Arrival date & time: 04/05/19  3557    History   Chief Complaint Chief Complaint  Patient presents with  . Headache    HPI Ebony Snyder is a 23 y.o. female.     Patient presents to the emergency department for evaluation of headache.  Patient has had which she feels is a migraine headache for more than 24 hours.  Patient reports a history of migraines with similar symptoms but has not had one in a while.  Patient reports sharp and throbbing pain on the right side of her head with associated nausea and vomiting.  She reports light and sound sensitivity.     Past Medical History:  Diagnosis Date  . Concussion   . Migraines     There are no active problems to display for this patient.   Past Surgical History:  Procedure Laterality Date  . MOUTH SURGERY       OB History   No obstetric history on file.      Home Medications    Prior to Admission medications   Medication Sig Start Date End Date Taking? Authorizing Provider  albuterol (PROVENTIL HFA;VENTOLIN HFA) 108 (90 Base) MCG/ACT inhaler Inhale 2 puffs into the lungs every 4 (four) hours as needed for wheezing or shortness of breath (cough, shortness of breath or wheezing.). Patient not taking: Reported on 06/02/2018 08/23/16   Elvina Sidle, MD  HYDROcodone-homatropine [facility]) 5-1.5 MG/5ML syrup Take 5 mLs by mouth every 6 (six) hours as needed for cough. Patient not taking: Reported on 06/02/2018 08/23/16   Elvina Sidle, MD  ibuprofen (ADVIL,MOTRIN) 600 MG tablet Take 1 tablet (600 mg total) by mouth every 6 (six) hours as needed. Patient not taking: Reported on 04/05/2019 05/30/18   Wieters, Hallie C, PA-C  ondansetron (ZOFRAN ODT) 4 MG disintegrating tablet Take 1 tablet (4 mg total) by mouth every 8 (eight) hours as needed for nausea or vomiting. Patient not taking: Reported on 04/05/2019 06/02/18   Shaune Pollack, MD   oxyCODONE-acetaminophen (PERCOCET/ROXICET) 5-325 MG tablet Take 1-2 tablets by mouth every 4 (four) hours as needed for moderate pain or severe pain. Patient not taking: Reported on 04/05/2019 06/02/18   Shaune Pollack, MD    Family History Family History  Problem Relation Age of Onset  . Hypertension Mother     Social History Social History   Tobacco Use  . Smoking status: Never Smoker  . Smokeless tobacco: Never Used  Substance Use Topics  . Alcohol use: No  . Drug use: No     Allergies   Patient has no known allergies.   Review of Systems Review of Systems  Gastrointestinal: Positive for nausea and vomiting.  Neurological: Positive for headaches.  All other systems reviewed and are negative.    Physical Exam Updated Vital Signs BP (!) 146/97   Pulse 73   Temp 98.7 F (37.1 C) (Oral)   Resp (!) 25   Wt 81.2 kg   SpO2 100%   BMI 32.74 kg/m   Physical Exam Vitals signs and nursing note reviewed.  Constitutional:      General: She is not in acute distress.    Appearance: Normal appearance. She is well-developed.  HENT:     Head: Normocephalic and atraumatic.     Right Ear: Hearing normal.     Left Ear: Hearing normal.     Nose: Nose normal.  Eyes:     Conjunctiva/sclera:  Conjunctivae normal.     Pupils: Pupils are equal, round, and reactive to light.  Neck:     Musculoskeletal: Normal range of motion and neck supple.  Cardiovascular:     Rate and Rhythm: Regular rhythm.     Heart sounds: S1 normal and S2 normal. No murmur. No friction rub. No gallop.   Pulmonary:     Effort: Pulmonary effort is normal. No respiratory distress.     Breath sounds: Normal breath sounds.  Chest:     Chest wall: No tenderness.  Abdominal:     General: Bowel sounds are normal.     Palpations: Abdomen is soft.     Tenderness: There is no abdominal tenderness. There is no guarding or rebound. Negative signs include Murphy's sign and McBurney's sign.     Hernia: No  hernia is present.  Musculoskeletal: Normal range of motion.  Skin:    General: Skin is warm and dry.     Findings: No rash.  Neurological:     Mental Status: She is alert and oriented to person, place, and time.     GCS: GCS eye subscore is 4. GCS verbal subscore is 5. GCS motor subscore is 6.     Cranial Nerves: No cranial nerve deficit.     Sensory: No sensory deficit.     Coordination: Coordination normal.  Psychiatric:        Speech: Speech normal.        Behavior: Behavior normal.        Thought Content: Thought content normal.      ED Treatments / Results  Labs (all labs ordered are listed, but only abnormal results are displayed) Labs Reviewed - No data to display  EKG None  Radiology No results found.  Procedures Procedures (including critical care time)  Medications Ordered in ED Medications  valproate (DEPACON) 500 mg in dextrose 5 % 50 mL IVPB (has no administration in time range)  sodium chloride 0.9 % bolus 1,000 mL (1,000 mLs Intravenous New Bag/Given 04/05/19 0715)  prochlorperazine (COMPAZINE) injection 10 mg (10 mg Intravenous Given 04/05/19 0718)  ketorolac (TORADOL) 30 MG/ML injection 30 mg (30 mg Intravenous Given 04/05/19 0718)  dexamethasone (DECADRON) injection 10 mg (10 mg Intravenous Given 04/05/19 0716)  diphenhydrAMINE (BENADRYL) injection 25 mg (25 mg Intravenous Given 04/05/19 0717)     Initial Impression / Assessment and Plan / ED Course  I have reviewed the triage vital signs and the nursing notes.  Pertinent labs & imaging results that were available during my care of the patient were reviewed by me and considered in my medical decision making (see chart for details).        Patient presents to the emergency department for evaluation of migraine headache. Patient is currently experiencing a headache that is similar to previous migraines. Patient does not have any unusual features compared to previous migraines. Patient has normal  neurologic examination. There are no unusual features, such as unusual intensity or sudden onset. As this headache is similar to previous migraines, there is no concern for subarachnoid hemorrhage or other etiology. Patient therefore does not require imaging. Patient treated as migraine headache.  She has had complete resolution of her headache after treatment.  Final Clinical Impressions(s) / ED Diagnoses   Final diagnoses:  Migraine without aura and without status migrainosus, not intractable    ED Discharge Orders    None       Pierre Cumpton, Canary Brim, MD 04/05/19 0731    Adaiah Jaskot,  Canary Brimhristopher J, MD 04/05/19 [phone]

## 2019-04-05 NOTE — ED Triage Notes (Signed)
Pt in with severe R sided HA since yesterday am. States worse since she was only able to sleep 2 hrs last night. Tearful in triage, denies any neuro or vision changes

## 2019-06-23 DIAGNOSIS — Z1388 Encounter for screening for disorder due to exposure to contaminants: Secondary | ICD-10-CM | POA: Diagnosis not present

## 2019-06-23 DIAGNOSIS — Z3009 Encounter for other general counseling and advice on contraception: Secondary | ICD-10-CM | POA: Diagnosis not present

## 2019-06-23 DIAGNOSIS — Z0389 Encounter for observation for other suspected diseases and conditions ruled out: Secondary | ICD-10-CM | POA: Diagnosis not present

## 2019-08-18 ENCOUNTER — Other Ambulatory Visit: Payer: Self-pay

## 2019-08-18 ENCOUNTER — Emergency Department (HOSPITAL_COMMUNITY)
Admission: EM | Admit: 2019-08-18 | Discharge: 2019-08-18 | Discharge status: Against Medical Advice | Payer: Medicaid Other | Attending: Emergency Medicine | Admitting: Emergency Medicine

## 2019-08-18 ENCOUNTER — Encounter (HOSPITAL_COMMUNITY): Payer: Self-pay | Admitting: Emergency Medicine

## 2019-08-18 DIAGNOSIS — Z5321 Procedure and treatment not carried out due to patient leaving prior to being seen by health care provider: Secondary | ICD-10-CM | POA: Insufficient documentation

## 2019-08-18 LAB — CBC WITH DIFFERENTIAL/PLATELET
Abs Immature Granulocytes: 0.01 10*3/uL (ref 0.00–0.07)
Basophils Absolute: 0 10*3/uL (ref 0.0–0.1)
Basophils Relative: 1 %
Eosinophils Absolute: 0.1 10*3/uL (ref 0.0–0.5)
Eosinophils Relative: 2 %
HCT: 37.6 % (ref 36.0–46.0)
Hemoglobin: 12.5 g/dL (ref 12.0–15.0)
Immature Granulocytes: 0 %
Lymphocytes Relative: 36 %
Lymphs Abs: 2.3 10*3/uL (ref 0.7–4.0)
MCH: 30.4 pg (ref 26.0–34.0)
MCHC: 33.2 g/dL (ref 30.0–36.0)
MCV: 91.5 fL (ref 80.0–100.0)
Monocytes Absolute: 0.6 10*3/uL (ref 0.1–1.0)
Monocytes Relative: 9 %
Neutro Abs: 3.4 10*3/uL (ref 1.7–7.7)
Neutrophils Relative %: 52 %
Platelets: 228 10*3/uL (ref 150–400)
RBC: 4.11 MIL/uL (ref 3.87–5.11)
RDW: 13 % (ref 11.5–15.5)
WBC: 6.5 10*3/uL (ref 4.0–10.5)
nRBC: 0 % (ref 0.0–0.2)

## 2019-08-18 LAB — BASIC METABOLIC PANEL
Anion gap: 9 (ref 5–15)
BUN: 9 mg/dL (ref 6–20)
CO2: 21 mmol/L — ABNORMAL LOW (ref 22–32)
Calcium: 9.1 mg/dL (ref 8.9–10.3)
Chloride: 109 mmol/L (ref 98–111)
Creatinine, Ser: 0.76 mg/dL (ref 0.44–1.00)
GFR calc Af Amer: >60 mL/min (ref >60)
GFR calc non Af Amer: >60 mL/min (ref >60)
Glucose, Bld: 91 mg/dL (ref 70–99)
Potassium: 3.7 mmol/L (ref 3.5–5.1)
Sodium: 139 mmol/L (ref 135–145)

## 2019-08-18 LAB — I-STAT BETA HCG BLOOD, ED (MC, WL, AP ONLY): I-stat hCG, quantitative: <5 m[IU]/mL (ref <5)

## 2019-08-18 NOTE — ED Triage Notes (Signed)
Patient reports persistent headache since Sunday this week unrelieved by OTC Ibuprofen , denies head injury , no fever or chills , denies neck stiffness, no neuro deficits.

## 2019-10-20 ENCOUNTER — Other Ambulatory Visit: Payer: Self-pay

## 2019-10-20 ENCOUNTER — Ambulatory Visit (HOSPITAL_COMMUNITY)
Admission: EM | Admit: 2019-10-20 | Discharge: 2019-10-20 | Disposition: A | Discharge status: Home / Self Care | Payer: Medicaid Other | Attending: Family Medicine | Admitting: Family Medicine

## 2019-10-20 ENCOUNTER — Encounter (HOSPITAL_COMMUNITY): Payer: Self-pay

## 2019-10-20 DIAGNOSIS — N926 Irregular menstruation, unspecified: Secondary | ICD-10-CM | POA: Insufficient documentation

## 2019-10-20 DIAGNOSIS — R109 Unspecified abdominal pain: Secondary | ICD-10-CM | POA: Insufficient documentation

## 2019-10-20 DIAGNOSIS — Z3202 Encounter for pregnancy test, result negative: Secondary | ICD-10-CM

## 2019-10-20 LAB — POCT URINALYSIS DIP (DEVICE)
Bilirubin Urine: NEGATIVE
Glucose, UA: NEGATIVE mg/dL
Hgb urine dipstick: NEGATIVE
Ketones, ur: NEGATIVE mg/dL
Leukocytes,Ua: NEGATIVE
Nitrite: NEGATIVE
Protein, ur: NEGATIVE mg/dL
Specific Gravity, Urine: 1.025 (ref 1.005–1.030)
Urobilinogen, UA: 1 mg/dL (ref 0.0–1.0)
pH: 6.5 (ref 5.0–8.0)

## 2019-10-20 LAB — POC URINE PREG, ED
Preg Test, Ur: NEGATIVE
Preg Test, Ur: NEGATIVE

## 2019-10-20 LAB — POCT PREGNANCY, URINE: Preg Test, Ur: NEGATIVE

## 2019-10-20 MED ORDER — IBUPROFEN 600 MG PO TABS: 600.0000 mg | ORAL_TABLET | Freq: Four times a day (QID) | ORAL | 0 refills | Status: DC | PRN

## 2019-10-20 NOTE — Discharge Instructions (Addendum)
Your pregnancy test was negative today. There is no infection in your urine. We are sending a swab for testing and we will call you with any positive results Given you contact for OB/GYN for follow-up and possible ultrasound and Pap smear. Ibuprofen 600 mg every 8 hours for pain as needed.

## 2019-10-20 NOTE — ED Triage Notes (Signed)
Patient presents to Urgent Care with complaints of lower abdominal cramping since the past two weeks. Patient reports her LMP was 09/11/2019.  Pt states she took pregnancy tests at home and they were negative.

## 2019-10-21 NOTE — ED Provider Notes (Signed)
[facility]    CSN: [id number] Arrival date & time: 10/20/19  0813      History   Chief Complaint Chief Complaint  Patient presents with  . Abdominal Cramping    HPI Ebony Snyder is a 23 y.o. female.   Pt is a 23 year old female that presents today for generalized lower abdominal cramping for the past 2 weeks.  Symptoms have been intermittent.  Reporting last menstrual period was 10/01/2019.  She took 2 pregnancy test at home that were negative.  She has had irregular menses and irregular bleeding for many months.  No vaginal discharge, dysuria, hematuria or urinary frequency.  No fevers, flank pain or nausea.  Currently sexually active, unprotected. Taking ibuprofen with some relief.   ROS per HPI    Abdominal Cramping    Past Medical History:  Diagnosis Date  . Concussion   . Migraines     There are no problems to display for this patient.   Past Surgical History:  Procedure Laterality Date  . MOUTH SURGERY      OB History   No obstetric history on file.      Home Medications    Prior to Admission medications   Medication Sig Start Date End Date Taking? Authorizing Provider  ibuprofen (ADVIL) 600 MG tablet Take 1 tablet (600 mg total) by mouth every 6 (six) hours as needed. 10/20/19   Dahlia Byes A, NP  albuterol (PROVENTIL HFA;VENTOLIN HFA) 108 (90 Base) MCG/ACT inhaler Inhale 2 puffs into the lungs every 4 (four) hours as needed for wheezing or shortness of breath (cough, shortness of breath or wheezing.). Patient not taking: Reported on 06/02/2018 08/23/16 10/20/19  Elvina Sidle, MD    Family History Family History  Problem Relation Age of Onset  . Hypertension Mother     Social History Social History   Tobacco Use  . Smoking status: Never Smoker  . Smokeless tobacco: Never Used  Substance Use Topics  . Alcohol use: Yes    Comment: occ  . Drug use: No     Allergies   Patient has no known allergies.   Review of  Systems Review of Systems   Physical Exam Triage Vital Signs ED Triage Vitals  Enc Vitals Group     BP 10/20/19 0843 135/85     Pulse Rate 10/20/19 0843 64     Resp 10/20/19 0843 17     Temp 10/20/19 0843 98.8 F (37.1 C)     Temp Source 10/20/19 0843 Oral     SpO2 10/20/19 0843 100 %     Weight --      Height --      Head Circumference --      Peak Flow --      Pain Score 10/20/19 0841 7     Pain Loc --      Pain Edu? --      Excl. in GC? --    No data found.  Updated Vital Signs BP 135/85 (BP Location: Left Arm)   Pulse 64   Temp 98.8 F (37.1 C) (Oral)   Resp 17   LMP 09/11/2019   SpO2 100%   Visual Acuity Right Eye Distance:   Left Eye Distance:   Bilateral Distance:    Right Eye Near:   Left Eye Near:    Bilateral Near:     Physical Exam   UC Treatments / Results  Labs (all labs ordered are listed, but only abnormal  results are displayed) Labs Reviewed  POC URINE PREG, ED  POCT PREGNANCY, URINE  POC URINE PREG, ED  POCT URINALYSIS DIP (DEVICE)  CERVICOVAGINAL ANCILLARY ONLY    EKG   Radiology No results found.  Procedures Procedures (including critical care time)  Medications Ordered in UC Medications - No data to display  Initial Impression / Assessment and Plan / UC Course  I have reviewed the triage vital signs and the nursing notes.  Pertinent labs & imaging results that were available during my care of the patient were reviewed by me and considered in my medical decision making (see chart for details).     Abdominal cramping with irregular menses. Infection vs endometriosis vs PCOS  Pregnancy test here negative today. Urine negative for pregnancy and infection. Resending a swab for testing with labs pending. Contact given for OB/GYN for follow-up and possible ultrasound Pap smear. Ibuprofen 600 mg every 8 hours for pain as needed Final Clinical Impressions(s) / UC Diagnoses   Final diagnoses:  Abdominal cramping    Irregular menses     Discharge Instructions     Your pregnancy test was negative today. There is no infection in your urine. We are sending a swab for testing and we will call you with any positive results Given you contact for OB/GYN for follow-up and possible ultrasound and Pap smear. Ibuprofen 600 mg every 8 hours for pain as needed.     ED Prescriptions    Medication Sig Dispense Auth. Provider   ibuprofen (ADVIL) 600 MG tablet Take 1 tablet (600 mg total) by mouth every 6 (six) hours as needed. 30 tablet Loura Halt A, NP     PDMP not reviewed this encounter.   Orvan July, NP 10/21/19 [phone]

## 2019-10-22 LAB — CERVICOVAGINAL ANCILLARY ONLY
Bacterial vaginitis: NEGATIVE
Candida vaginitis: NEGATIVE
Chlamydia: NEGATIVE
Neisseria Gonorrhea: NEGATIVE
Trichomonas: NEGATIVE

## 2019-11-02 ENCOUNTER — Ambulatory Visit: Payer: Medicaid Other | Attending: Internal Medicine

## 2019-11-02 DIAGNOSIS — Z20822 Contact with and (suspected) exposure to covid-19: Secondary | ICD-10-CM

## 2019-11-02 DIAGNOSIS — Z20828 Contact with and (suspected) exposure to other viral communicable diseases: Secondary | ICD-10-CM | POA: Diagnosis not present

## 2019-11-04 LAB — NOVEL CORONAVIRUS, NAA: SARS-CoV-2, NAA: NOT DETECTED

## 2019-12-03 ENCOUNTER — Ambulatory Visit: Payer: Medicaid Other | Attending: Internal Medicine

## 2019-12-03 DIAGNOSIS — Z20822 Contact with and (suspected) exposure to covid-19: Secondary | ICD-10-CM

## 2019-12-03 DIAGNOSIS — U071 COVID-19: Secondary | ICD-10-CM | POA: Insufficient documentation

## 2019-12-04 LAB — NOVEL CORONAVIRUS, NAA: SARS-CoV-2, NAA: DETECTED — AB

## 2019-12-05 ENCOUNTER — Telehealth: Payer: Self-pay | Admitting: Adult Health

## 2019-12-05 ENCOUNTER — Encounter: Payer: Self-pay | Admitting: Adult Health

## 2019-12-05 NOTE — Telephone Encounter (Signed)
Called patient to see how she is feeling since testing positive for COVID19 and to review with patient her eligibility to receive monoclonal antibody therapy.  Patient didn't wish to discuss it at this time.  Gave her our call back number.  My chart message sent.    Lillard Anes, NP

## 2020-01-18 ENCOUNTER — Ambulatory Visit (INDEPENDENT_AMBULATORY_CARE_PROVIDER_SITE_OTHER): Payer: 59 | Admitting: Obstetrics and Gynecology

## 2020-01-18 ENCOUNTER — Other Ambulatory Visit: Payer: Self-pay

## 2020-01-18 ENCOUNTER — Encounter: Payer: Self-pay | Admitting: Obstetrics and Gynecology

## 2020-01-18 ENCOUNTER — Other Ambulatory Visit (HOSPITAL_COMMUNITY)
Admission: RE | Admit: 2020-01-18 | Discharge: 2020-01-18 | Disposition: A | Discharge status: Home / Self Care | Payer: Medicaid Other | Source: Ambulatory Visit | Attending: Obstetrics and Gynecology | Admitting: Obstetrics and Gynecology

## 2020-01-18 VITALS — BP 122/64 | HR 71 | Ht 62.0 in | Wt 187.0 lb

## 2020-01-18 DIAGNOSIS — Z113 Encounter for screening for infections with a predominantly sexual mode of transmission: Secondary | ICD-10-CM | POA: Insufficient documentation

## 2020-01-18 DIAGNOSIS — N946 Dysmenorrhea, unspecified: Secondary | ICD-10-CM

## 2020-01-18 DIAGNOSIS — Z0000 Encounter for general adult medical examination without abnormal findings: Secondary | ICD-10-CM

## 2020-01-18 DIAGNOSIS — Z6834 Body mass index (BMI) 34.0-34.9, adult: Secondary | ICD-10-CM

## 2020-01-18 DIAGNOSIS — Z124 Encounter for screening for malignant neoplasm of cervix: Secondary | ICD-10-CM | POA: Insufficient documentation

## 2020-01-18 DIAGNOSIS — R109 Unspecified abdominal pain: Secondary | ICD-10-CM

## 2020-01-18 DIAGNOSIS — Z01419 Encounter for gynecological examination (general) (routine) without abnormal findings: Secondary | ICD-10-CM

## 2020-01-18 DIAGNOSIS — R102 Pelvic and perineal pain: Secondary | ICD-10-CM

## 2020-01-18 MED ORDER — NAPROXEN SODIUM 550 MG PO TABS: 550.0000 mg | ORAL_TABLET | Freq: Two times a day (BID) | ORAL | 2 refills | Status: DC

## 2020-01-18 MED ORDER — NORETHIN ACE-ETH ESTRAD-FE 1-20 MG-MCG PO TABS: 1.0000 | ORAL_TABLET | Freq: Every day | ORAL | 0 refills | Status: DC

## 2020-01-18 NOTE — Patient Instructions (Addendum)
EXERCISE AND DIET:  We recommended that you start or continue a regular exercise program for good health. Regular exercise means any activity that makes your heart beat faster and makes you sweat.  We recommend exercising at least 30 minutes per day at least 3 days a week, preferably 4 or 5.  We also recommend a diet low in fat and sugar.  Inactivity, poor dietary choices and obesity can cause diabetes, heart attack, stroke, and kidney damage, among others.    ALCOHOL AND SMOKING:  Women should limit their alcohol intake to no more than 7 drinks/beers/glasses of wine (combined, not each!) per week. Moderation of alcohol intake to this level decreases your risk of breast cancer and liver damage. And of course, no recreational drugs are part of a healthy lifestyle.  And absolutely no smoking or even second hand smoke. Most people know smoking can cause heart and lung diseases, but did you know it also contributes to weakening of your bones? Aging of your skin?  Yellowing of your teeth and nails?  CALCIUM AND VITAMIN D:  Adequate intake of calcium and Vitamin D are recommended.  The recommendations for exact amounts of these supplements seem to change often, but generally speaking 1,000 mg of calcium (between diet and supplement) and 800 units of Vitamin D per day seems prudent. Certain women may benefit from higher intake of Vitamin D.  If you are among these women, your doctor will have told you during your visit.    PAP SMEARS:  Pap smears, to check for cervical cancer or precancers,  have traditionally been done yearly, although recent scientific advances have shown that most women can have pap smears less often.  However, every woman still should have a physical exam from her gynecologist every year. It will include a breast check, inspection of the vulva and vagina to check for abnormal growths or skin changes, a visual exam of the cervix, and then an exam to evaluate the size and shape of the uterus and  ovaries.  And after 25 years of age, a rectal exam is indicated to check for rectal cancers. We will also provide age appropriate advice regarding health maintenance, like when you should have certain vaccines, screening for sexually transmitted diseases, bone density testing, colonoscopy, mammograms, etc.   MAMMOGRAMS:  All women over 40 years old should have a yearly mammogram. Many facilities now offer a "3D" mammogram, which may cost around $50 extra out of pocket. If possible,  we recommend you accept the option to have the 3D mammogram performed.  It both reduces the number of women who will be called back for extra views which then turn out to be normal, and it is better than the routine mammogram at detecting truly abnormal areas.    COLON CANCER SCREENING: Now recommend starting at age 45. At this time colonoscopy is not covered for routine screening until 50. There are take home tests that can be done between 45-49.   COLONOSCOPY:  Colonoscopy to screen for colon cancer is recommended for all women at age 50.  We know, you hate the idea of the prep.  We agree, BUT, having colon cancer and not knowing it is worse!!  Colon cancer so often starts as a polyp that can be seen and removed at colonscopy, which can quite literally save your life!  And if your first colonoscopy is normal and you have no family history of colon cancer, most women don't have to have it again for   10 years.  Once every ten years, you can do something that may end up saving your life, right?  We will be happy to help you get it scheduled when you are ready.  Be sure to check your insurance coverage so you understand how much it will cost.  It may be covered as a preventative service at no cost, but you should check your particular policy.      Breast Self-Awareness Breast self-awareness means being familiar with how your breasts look and feel. It involves checking your breasts regularly and reporting any changes to your  health care provider. Practicing breast self-awareness is important. A change in your breasts can be a sign of a serious medical problem. Being familiar with how your breasts look and feel allows you to find any problems early, when treatment is more likely to be successful. All women should practice breast self-awareness, including women who have had breast implants. How to do a breast self-exam One way to learn what is normal for your breasts and whether your breasts are changing is to do a breast self-exam. To do a breast self-exam: Look for Changes  1. Remove all the clothing above your waist. 2. Stand in front of a mirror in a room with good lighting. 3. Put your hands on your hips. 4. Push your hands firmly downward. 5. Compare your breasts in the mirror. Look for differences between them (asymmetry), such as: ? Differences in shape. ? Differences in size. ? Puckers, dips, and bumps in one breast and not the other. 6. Look at each breast for changes in your skin, such as: ? Redness. ? Scaly areas. 7. Look for changes in your nipples, such as: ? Discharge. ? Bleeding. ? Dimpling. ? Redness. ? A change in position. Feel for Changes Carefully feel your breasts for lumps and changes. It is best to do this while lying on your back on the floor and again while sitting or standing in the shower or tub with soapy water on your skin. Feel each breast in the following way:  Place the arm on the side of the breast you are examining above your head.  Feel your breast with the other hand.  Start in the nipple area and make  inch (2 cm) overlapping circles to feel your breast. Use the pads of your three middle fingers to do this. Apply light pressure, then medium pressure, then firm pressure. The light pressure will allow you to feel the tissue closest to the skin. The medium pressure will allow you to feel the tissue that is a little deeper. The firm pressure will allow you to feel the tissue  close to the ribs.  Continue the overlapping circles, moving downward over the breast until you feel your ribs below your breast.  Move one finger-width toward the center of the body. Continue to use the  inch (2 cm) overlapping circles to feel your breast as you move slowly up toward your collarbone.  Continue the up and down exam using all three pressures until you reach your armpit.  Write Down What You Find  Write down what is normal for each breast and any changes that you find. Keep a written record with breast changes or normal findings for each breast. By writing this information down, you do not need to depend only on memory for size, tenderness, or location. Write down where you are in your menstrual cycle, if you are still menstruating. If you are having trouble noticing differences   in your breasts, do not get discouraged. With time you will become more familiar with the variations in your breasts and more comfortable with the exam. How often should I examine my breasts? Examine your breasts every month. If you are breastfeeding, the best time to examine your breasts is after a feeding or after using a breast pump. If you menstruate, the best time to examine your breasts is 5-7 days after your period is over. During your period, your breasts are lumpier, and it may be more difficult to notice changes. When should I see my health care provider? See your health care provider if you notice:  A change in shape or size of your breasts or nipples.  A change in the skin of your breast or nipples, such as a reddened or scaly area.  Unusual discharge from your nipples.  A lump or thick area that was not there before.  Pain in your breasts.  Anything that concerns you.  Oral Contraception Information Oral contraceptive pills (OCPs) are medicines taken to prevent pregnancy. OCPs are taken by mouth, and they work by:  Preventing the ovaries from releasing eggs.  Thickening mucus in  the lower part of the uterus (cervix), which prevents sperm from entering the uterus.  Thinning the lining of the uterus (endometrium), which prevents a fertilized egg from attaching to the endometrium. OCPs are highly effective when taken exactly as prescribed. However, OCPs do not prevent STIs (sexually transmitted infections). Safe sex practices, such as using condoms while on an OCP, can help prevent STIs. Before starting OCPs Before you start taking OCPs, you may have a physical exam, blood test, and Pap test. However, you are not required to have a pelvic exam in order to be prescribed OCPs. Your health care provider will make sure you are a good candidate for oral contraception. OCPs are not a good option for certain women, including women who smoke and are older than 35 years, and women with a medical history of high blood pressure, deep vein thrombosis, pulmonary embolism, stroke, cardiovascular disease, or peripheral vascular disease. Discuss with your health care provider the possible side effects of the OCP you may be prescribed. When you start an OCP, be aware that it can take 2-3 months for your body to adjust to changes in hormone levels. Follow instructions from your health care provider about how to start taking your first cycle of OCPs. Depending on when you start the pill, you may need to use a backup form of birth control, such as condoms, during the first week. Make sure you know what steps to take if you ever forget to take the pill. Types of oral contraception  The most common types of birth control pills contain the hormones estrogen and progestin (synthetic progesterone) or progestin only. The combination pill This type of pill contains estrogen and progestin hormones. Combination pills often come in packs of 21, 28, or 91 pills. For each pack, the last 7 pills may not contain hormones, which means you may stop taking the pills for 7 days. Menstrual bleeding occurs during the  week that you do not take the pills or that you take the pills with no hormones in them. The minipill This type of pill contains the progestin hormone only. It comes in packs of 28 pills. All 28 pills contain the hormone. You take the pill every day. It is very important to take the pill at the same time each day. Advantages of oral contraceptive pills    Provides reliable and continuous contraception if taken as instructed.  May treat or decrease symptoms of: ? Menstrual period cramps. ? Irregular menstrual cycle or bleeding. ? Heavy menstrual flow. ? Abnormal uterine bleeding. ? Acne, depending on the type of pill. ? Polycystic ovarian syndrome. ? Endometriosis. ? Iron deficiency anemia. ? Premenstrual symptoms, including premenstrual dysphoric disorder.  May reduce the risk of endometrial and ovarian cancer.  Can be used as emergency contraception.  Prevents mislocated (ectopic) pregnancies and infections of the fallopian tubes. Things that can make oral contraceptive pills less effective OCPs can be less effective if:  You forget to take the pill at the same time every day. This is especially important when taking the minipill.  You have a stomach or intestinal disease that reduces your body's ability to absorb the pill.  You take OCPs with other medicines that make OCPs less effective, such as antibiotics, certain HIV medicines, and some seizure medicines.  You take expired OCPs.  You forget to restart the pill on day 7, if using the packs of 21 pills. Risks associated with oral contraceptive pills Oral contraceptive pills can sometimes cause side effects, such as:  Headache.  Depression.  Trouble sleeping.  Nausea and vomiting.  Breast tenderness.  Irregular bleeding or spotting during the first several months.  Bloating or fluid retention.  Increase in blood pressure. Combination pills are also associated with a small increase in the risk of:  Blood  clots.  Heart attack.  Stroke. Summary  Oral contraceptive pills are medicines taken by mouth to prevent pregnancy. They are highly effective when taken exactly as prescribed.  The most common types of birth control pills contain the hormones estrogen and progestin (synthetic progesterone) or progestin only.  Before you start taking the pill, you may have a physical exam, blood test, and Pap test. Your health care provider will make sure you are a good candidate for oral contraception.  The combination pill may come in a 21-day pack, a 28-day pack, or a 91-day pack. The minipill contains the progesterone hormone only and comes in packs of 28 pills.  Oral contraceptive pills can sometimes cause side effects, such as headache, nausea, breast tenderness, or irregular bleeding. This information is not intended to replace advice given to you by your health care provider. Make sure you discuss any questions you have with your health care provider. Document Revised: 10/04/2017 Document Reviewed: 01/15/2017 Elsevier Patient Education  2020 [employer]  

## 2020-01-18 NOTE — Progress Notes (Signed)
24 y.o. No obstetric history on file. Single Black or African American Not Hispanic or Latino female here for annual exam.Patient states that she has been having really bad cramping all the time since December 2020. Cycles are monthly x 6 days. She saturates a super tampon in 4 hours. She has always had bad cramps, went on OCP's as a teen secondary to cramps. She is working from home and able to function during her cycle. She takes extra strength tylenol or aleve help some, no help with ibuprofen.  For the last several months she has had menstrual like cramps all the time. The pain is constant, 7-8/10 in severity. Negative GC/CT/Trich testing in 12/20. Sexually active, same partner for over a year. She doesn't know if he has other partners. She uses condoms sometimes. No dyspareunia. Worse cramps after sex.  No bowel or bladder c/o.  Period Duration (Days): 6 Period Pattern: (!) Irregular Menstrual Control: Tampon, Maxi pad Menstrual Control Change Freq (Hours): 4 Dysmenorrhea: (!) Severe Dysmenorrhea Symptoms: Throbbing, Cramping  Patient's last menstrual period was 01/17/2020.          Sexually active: Yes.    The current method of family planning is condoms sometimes.    Exercising: No.  The patient does not participate in regular exercise at present. Smoker:  no  Health Maintenance: Pap:  Never  History of abnormal Pap:  NA TDaP:  Unsure, thinks UTD  Gardasil: thinks she had them, will check.   reports that she has never smoked. She has never used smokeless tobacco. She reports current alcohol use. She reports that she does not use drugs. Just occasional ETOH. She is working for [employer] (from home with Covid).   Past Medical History:  Diagnosis Date  . Anxiety   . Concussion   . Depression   . Dysmenorrhea   . Migraines   . STD (sexually transmitted disease)   Had trich  Past Surgical History:  Procedure Laterality Date  . MOUTH SURGERY      No current  outpatient medications on file.   No current facility-administered medications for this visit.    Family History  Problem Relation Age of Onset  . Hypertension Mother     Review of Systems  All other systems reviewed and are negative.   Exam:   BP 122/64   Pulse 71   Ht 5\' 2"  (1.575 m)   Wt 187 lb (84.8 kg)   LMP 01/17/2020   SpO2 98%   BMI 34.20 kg/m   Weight change: @WEIGHTCHANGE @ Height:   Height: 5\' 2"  (157.5 cm)  Ht Readings from Last 3 Encounters:  01/18/20 5\' 2"  (1.575 m)  12/25/14 5\' 2"  (1.575 m) (18 %, Z= -0.90)*  08/25/14 5\' 2"  (1.575 m) (18 %, Z= -0.90)*   * Growth percentiles are based on CDC (Girls, 2-20 Years) data.    General appearance: alert, cooperative and appears stated age Head: Normocephalic, without obvious abnormality, atraumatic Neck: no adenopathy, supple, symmetrical, trachea midline and thyroid normal to inspection and palpation Lungs: clear to auscultation bilaterally Cardiovascular: regular rate and rhythm Breasts: normal appearance, no masses or tenderness Abdomen: soft, non-tender; non distended,  no masses,  no organomegaly Extremities: extremities normal, atraumatic, no cyanosis or edema Skin: Skin color, texture, turgor normal. No rashes or lesions Lymph nodes: Cervical, supraclavicular, and axillary nodes normal. No abnormal inguinal nodes palpated Neurologic: Grossly normal   Pelvic: External genitalia:  no lesions  Urethra:  normal appearing urethra with no masses, tenderness or lesions              Bartholins and Skenes: normal                 Vagina: normal appearing vagina with normal color and discharge, no lesions              Cervix: no cervical motion tenderness and no lesions               Bimanual Exam:  Uterus:  normal size, contour, position, consistency, mobility, non-tender              Adnexa: no masses, mildly tender in the right adnexa               Rectovaginal: Confirms               Anus:   normal sphincter tone, no lesions  Pelvic floor: not tender  Carolynn Serve chaperoned for the exam.  A:  Well Woman with normal exam  Abdominal/pelvic pain, essentially normal exam  Severe dysmenorrhea  P:   Pap with std testing  Screening labs, std testing  Anaprox DS for prn use  Start OCP's, no contraindications, risks reviewed  F/U in 3 months  We discussed doing an ultrasound now or waiting to see if OCP's help her pain. She will call if she wants to schedule an ultrasound prior to her f/u appointment  Discussed breast self exam  Discussed calcium and vit D intake

## 2020-01-19 LAB — CBC
Hematocrit: 36.7 % (ref 34.0–46.6)
Hemoglobin: 12.2 g/dL (ref 11.1–15.9)
MCH: 30.1 pg (ref 26.6–33.0)
MCHC: 33.2 g/dL (ref 31.5–35.7)
MCV: 91 fL (ref 79–97)
Platelets: 251 10*3/uL (ref 150–450)
RBC: 4.05 x10E6/uL (ref 3.77–5.28)
RDW: 13 % (ref 11.7–15.4)
WBC: 7.9 10*3/uL (ref 3.4–10.8)

## 2020-01-19 LAB — LIPID PANEL
Chol/HDL Ratio: 4.1 ratio (ref 0.0–4.4)
Cholesterol, Total: 150 mg/dL (ref 100–199)
HDL: 37 mg/dL — ABNORMAL LOW (ref >39)
LDL Chol Calc (NIH): 102 mg/dL — ABNORMAL HIGH (ref 0–99)
Triglycerides: 50 mg/dL (ref 0–149)
VLDL Cholesterol Cal: 11 mg/dL (ref 5–40)

## 2020-01-19 LAB — HEPATITIS C ANTIBODY: Hep C Virus Ab: <0.1 s/co ratio (ref 0.0–0.9)

## 2020-01-19 LAB — COMPREHENSIVE METABOLIC PANEL
ALT: 14 IU/L (ref 0–32)
AST: 17 IU/L (ref 0–40)
Albumin/Globulin Ratio: 1.6 (ref 1.2–2.2)
Albumin: 4.1 g/dL (ref 3.9–5.0)
Alkaline Phosphatase: 56 IU/L (ref 39–117)
BUN/Creatinine Ratio: 15 (ref 9–23)
BUN: 12 mg/dL (ref 6–20)
Bilirubin Total: 0.4 mg/dL (ref 0.0–1.2)
CO2: 22 mmol/L (ref 20–29)
Calcium: 8.9 mg/dL (ref 8.7–10.2)
Chloride: 108 mmol/L — ABNORMAL HIGH (ref 96–106)
Creatinine, Ser: 0.78 mg/dL (ref 0.57–1.00)
GFR calc Af Amer: 123 mL/min/{1.73_m2} (ref >59)
GFR calc non Af Amer: 107 mL/min/{1.73_m2} (ref >59)
Globulin, Total: 2.5 g/dL (ref 1.5–4.5)
Glucose: 96 mg/dL (ref 65–99)
Potassium: 4 mmol/L (ref 3.5–5.2)
Sodium: 142 mmol/L (ref 134–144)
Total Protein: 6.6 g/dL (ref 6.0–8.5)

## 2020-01-19 LAB — HEP, RPR, HIV PANEL
HIV Screen 4th Generation wRfx: NONREACTIVE
Hepatitis B Surface Ag: NEGATIVE
RPR Ser Ql: NONREACTIVE

## 2020-01-19 LAB — HEMOGLOBIN A1C
Est. average glucose Bld gHb Est-mCnc: 108 mg/dL
Hgb A1c MFr Bld: 5.4 % (ref 4.8–5.6)

## 2020-01-19 LAB — TSH: TSH: 0.834 u[IU]/mL (ref 0.450–4.500)

## 2020-01-20 LAB — CYTOLOGY - PAP
Chlamydia: NEGATIVE
Comment: NEGATIVE
Comment: NEGATIVE
Comment: NORMAL
Diagnosis: NEGATIVE
Neisseria Gonorrhea: NEGATIVE
Trichomonas: NEGATIVE

## 2020-01-21 ENCOUNTER — Other Ambulatory Visit: Payer: Self-pay

## 2020-01-21 ENCOUNTER — Encounter (HOSPITAL_COMMUNITY): Payer: Self-pay

## 2020-01-21 ENCOUNTER — Ambulatory Visit (HOSPITAL_COMMUNITY)
Admission: EM | Admit: 2020-01-21 | Discharge: 2020-01-21 | Disposition: A | Discharge status: Home / Self Care | Payer: Self-pay | Attending: Physician Assistant | Admitting: Physician Assistant

## 2020-01-21 DIAGNOSIS — G43009 Migraine without aura, not intractable, without status migrainosus: Secondary | ICD-10-CM

## 2020-01-21 MED ORDER — KETOROLAC TROMETHAMINE 60 MG/2ML IM SOLN
60.0000 mg | Freq: Once | INTRAMUSCULAR | Status: AC
  Administered 2020-01-21: 60 mg via INTRAMUSCULAR

## 2020-01-21 MED ORDER — DEXAMETHASONE SODIUM PHOSPHATE 10 MG/ML IJ SOLN
INTRAMUSCULAR | Status: AC
  Filled 2020-01-21: qty 1

## 2020-01-21 MED ORDER — METOCLOPRAMIDE HCL 5 MG/ML IJ SOLN
5.0000 mg | Freq: Once | INTRAMUSCULAR | Status: AC
  Administered 2020-01-21: 5 mg via INTRAMUSCULAR

## 2020-01-21 MED ORDER — KETOROLAC TROMETHAMINE 60 MG/2ML IM SOLN
INTRAMUSCULAR | Status: AC
  Filled 2020-01-21: qty 2

## 2020-01-21 MED ORDER — METOCLOPRAMIDE HCL 5 MG/ML IJ SOLN
INTRAMUSCULAR | Status: AC
  Filled 2020-01-21: qty 2

## 2020-01-21 MED ORDER — DEXAMETHASONE SODIUM PHOSPHATE 10 MG/ML IJ SOLN
10.0000 mg | Freq: Once | INTRAMUSCULAR | Status: AC
  Administered 2020-01-21: 10 mg via INTRAMUSCULAR

## 2020-01-21 NOTE — ED Provider Notes (Addendum)
[facility]    CSN: [id number] Arrival date & time: 01/21/20  3536      History   Chief Complaint Chief Complaint  Patient presents with  . Migraine    HPI Ebony Snyder is a 24 y.o. female.   Patient presents for 1 day history of migraine headache.  She reports this is a typical migraine for her .  Reports right-sided sharp and throbbing pain at times..  Pain has been as high as 8 out of 10 is currently reported as a 6 out of 10 has had some mild relief with cool compress and Tylenol.  She has associated nausea with vomiting.  She denies visual changes but endorses some photophobia.  She denies numbness tingling weakness or any neurologic reports.  She reports she has not had a migraine in almost a year.  He has no other associated symptoms of cough, congestion, sore throat, diarrhea or abdominal pain.     Past Medical History:  Diagnosis Date  . Anxiety   . Concussion   . Depression   . Dysmenorrhea   . Migraine without aura   . STD (sexually transmitted disease)     There are no problems to display for this patient.   Past Surgical History:  Procedure Laterality Date  . MOUTH SURGERY      OB History   No obstetric history on file.      Home Medications    Prior to Admission medications   Medication Sig Start Date End Date Taking? Authorizing Provider  naproxen sodium (ANAPROX DS) 550 MG tablet Take 1 tablet (550 mg total) by mouth 2 (two) times daily with a meal. Use prn cramps 01/18/20   Romualdo Bolk, MD  norethindrone-ethinyl estradiol (LOESTRIN FE) 1-20 MG-MCG tablet Take 1 tablet by mouth daily. 01/18/20   Romualdo Bolk, MD  albuterol (PROVENTIL HFA;VENTOLIN HFA) 108 (90 Base) MCG/ACT inhaler Inhale 2 puffs into the lungs every 4 (four) hours as needed for wheezing or shortness of breath (cough, shortness of breath or wheezing.). Patient not taking: Reported on 06/02/2018 08/23/16 10/20/19  Elvina Sidle, MD    Family  History Family History  Problem Relation Age of Onset  . Hypertension Mother   . Diabetes Maternal Grandfather     Social History Social History   Tobacco Use  . Smoking status: Never Smoker  . Smokeless tobacco: Never Used  Substance Use Topics  . Alcohol use: Yes    Comment: occ  . Drug use: No     Allergies   Patient has no known allergies.   Review of Systems Review of Systems  Constitutional: Negative for chills and fever.  HENT: Negative.   Eyes: Positive for photophobia. Negative for pain, redness and visual disturbance.  Respiratory: Negative for cough and shortness of breath.   Cardiovascular: Negative for chest pain and palpitations.  Gastrointestinal: Positive for nausea and vomiting. Negative for abdominal pain and diarrhea.  Musculoskeletal: Negative for arthralgias, myalgias and neck pain.  Skin: Negative for color change and rash.  Neurological: Positive for headaches. Negative for dizziness, tremors, seizures, syncope, facial asymmetry, speech difficulty, weakness, light-headedness and numbness.     Physical Exam Triage Vital Signs ED Triage Vitals  Enc Vitals Group     BP 01/21/20 0826 (!) 144/93     Pulse Rate 01/21/20 0826 86     Resp 01/21/20 0826 16     Temp 01/21/20 0826 98.8 F (37.1 C)     Temp  Source 01/21/20 0826 Oral     SpO2 01/21/20 0826 100 %     Weight 01/21/20 0827 186 lb (84.4 kg)     Height --      Head Circumference --      Peak Flow --      Pain Score 01/21/20 0827 6     Pain Loc --      Pain Edu? --      Excl. in GC? --    No data found.  Updated Vital Signs BP (!) 144/93 (BP Location: Right Arm)   Pulse 86   Temp 98.8 F (37.1 C) (Oral)   Resp 16   Wt 186 lb (84.4 kg)   LMP 01/17/2020   SpO2 100%   BMI 34.02 kg/m   Visual Acuity Right Eye Distance:   Left Eye Distance:   Bilateral Distance:    Right Eye Near:   Left Eye Near:    Bilateral Near:     Physical Exam Vitals and nursing note reviewed.    Constitutional:      Appearance: She is well-developed.     Comments: Initial encounter patient sitting on exam table in mild distress secondary to headache pain.  She is tearful and holding cool rag on her forehead.    On repeat encounter patient was ambulatory and standing and attempting to eat however did not consume the food.  Still in distress due to headache pain  HENT:     Head: Normocephalic and atraumatic.     Nose:     Comments: Mask in place Eyes:     General: No scleral icterus.    Extraocular Movements: Extraocular movements intact.     Conjunctiva/sclera: Conjunctivae normal.     Pupils: Pupils are equal, round, and reactive to light.  Cardiovascular:     Rate and Rhythm: Normal rate.  Pulmonary:     Effort: Pulmonary effort is normal. No respiratory distress.  Musculoskeletal:     Cervical back: Normal range of motion and neck supple.     Right lower leg: No edema.     Left lower leg: No edema.  Skin:    General: Skin is warm and dry.     Findings: No rash.  Neurological:     General: No focal deficit present.     Mental Status: She is alert and oriented to person, place, and time.     Cranial Nerves: No cranial nerve deficit.     Motor: No weakness.     Coordination: Coordination normal.     Gait: Gait normal.      UC Treatments / Results  Labs (all labs ordered are listed, but only abnormal results are displayed) Labs Reviewed - No data to display  EKG   Radiology No results found.  Procedures Procedures (including critical care time)  Medications Ordered in UC Medications  ketorolac (TORADOL) injection 60 mg (60 mg Intramuscular Given 01/21/20 0903)  metoCLOPramide (REGLAN) injection 5 mg (5 mg Intramuscular Given 01/21/20 0903)  dexamethasone (DECADRON) injection 10 mg (10 mg Intramuscular Given 01/21/20 0903)    Initial Impression / Assessment and Plan / UC Course  I have reviewed the triage vital signs and the nursing notes.  Pertinent  labs & imaging results that were available during my care of the patient were reviewed by me and considered in my medical decision making (see chart for details).     Migraine Patient is a 24 year old female presenting with migraine headache.  This appears  consistent with previous reported headaches and on chart review this seems consistent with other presentations with most recent in 03/2019.  Given she is neurologically well and there is no major change in her headache with previous episodes, do not believe there is indication for further imaging at this time.  We will treat in clinic today. -  Given she has nausea and vomiting at this time will give IM Toradol, dexamethasone and Reglan.  - Patient reports her mother can come and take her home this was discussed the best option due to possible drowsiness of Reglan.   -Instructed patient to go home and try to sleep.   -Discussed that headaches became more frequent, change in quality or intensity that she should seek evaluation at the emergency department or follow-up with primary care. -Instructed to take Aleve or ibuprofen at first onset of future headaches and if she is able to sleep to take 1-2 Benadryl. -Patient verbalized understanding and is stable for discharge with ride home.   Final Clinical Impressions(s) / UC Diagnoses   Final diagnoses:  Migraine without aura and without status migrainosus, not intractable     Discharge Instructions     The medicine I gave you may make you little sleepy I want you to have a ride home.  You may take naproxen or ibuprofen again in 8 hours.  You should take a naproxen at the first sign of headache in the future.  If you are able to sleep you may also take 1 Benadryl along with this naproxen and try to sleep.  Do not drive after taking the Benadryl.  If you have a significant change in the quality, duration or severity of your headache like for you to consider going to the emergency  department.  These follow-up with your primary care for future concerns about migraine headache prevention.     ED Prescriptions    None     PDMP not reviewed this encounter.   Purnell Shoemaker, PA-C 01/21/20 8916    Rianna Lukes, Marguerita Beards, PA-C 01/21/20 0930

## 2020-01-21 NOTE — Discharge Instructions (Addendum)
The medicine I gave you may make you little sleepy I want you to have a ride home.  You may take naproxen or ibuprofen again in 8 hours.  You should take a naproxen at the first sign of headache in the future.  If you are able to sleep you may also take 1 Benadryl along with this naproxen and try to sleep.  Do not drive after taking the Benadryl.  If you have a significant change in the quality, duration or severity of your headache like for you to consider going to the emergency department.  These follow-up with your primary care for future concerns about migraine headache prevention.

## 2020-01-21 NOTE — ED Triage Notes (Signed)
Pt states she has Migraine this started yesterday. Pt states she has been taking Tylenol 500 mg. Pt states they are not working.

## 2020-03-18 ENCOUNTER — Other Ambulatory Visit: Payer: Medicaid Other

## 2020-03-21 ENCOUNTER — Ambulatory Visit: Payer: Medicaid Other | Attending: Internal Medicine

## 2020-03-21 DIAGNOSIS — Z20822 Contact with and (suspected) exposure to covid-19: Secondary | ICD-10-CM

## 2020-03-22 LAB — NOVEL CORONAVIRUS, NAA: SARS-CoV-2, NAA: NOT DETECTED

## 2020-03-22 LAB — SARS-COV-2, NAA 2 DAY TAT

## 2020-04-19 ENCOUNTER — Telehealth: Payer: Self-pay

## 2020-04-19 NOTE — Telephone Encounter (Signed)
Patient canceled 3 month follow up for (04/20/20). Patient is rescheduled for (06/23/20).

## 2020-04-20 ENCOUNTER — Ambulatory Visit: Payer: 59 | Admitting: Obstetrics and Gynecology

## 2020-05-18 ENCOUNTER — Ambulatory Visit (HOSPITAL_COMMUNITY)
Admission: EM | Admit: 2020-05-18 | Discharge: 2020-05-18 | Disposition: A | Discharge status: Home / Self Care | Payer: Medicaid Other | Attending: Urgent Care | Admitting: Urgent Care

## 2020-05-18 ENCOUNTER — Encounter (HOSPITAL_COMMUNITY): Payer: Self-pay

## 2020-05-18 DIAGNOSIS — Z3202 Encounter for pregnancy test, result negative: Secondary | ICD-10-CM

## 2020-05-18 DIAGNOSIS — R102 Pelvic and perineal pain unspecified side: Secondary | ICD-10-CM

## 2020-05-18 DIAGNOSIS — N941 Unspecified dyspareunia: Secondary | ICD-10-CM

## 2020-05-18 DIAGNOSIS — R109 Unspecified abdominal pain: Secondary | ICD-10-CM

## 2020-05-18 LAB — POC URINE PREG, ED: Preg Test, Ur: NEGATIVE

## 2020-05-18 MED ORDER — KETOROLAC TROMETHAMINE 60 MG/2ML IM SOLN
60.0000 mg | Freq: Once | INTRAMUSCULAR | Status: AC
  Administered 2020-05-18: 60 mg via INTRAMUSCULAR

## 2020-05-18 MED ORDER — CELECOXIB 200 MG PO CAPS: 200.0000 mg | ORAL_CAPSULE | Freq: Two times a day (BID) | ORAL | 0 refills | Status: DC

## 2020-05-18 MED ORDER — KETOROLAC TROMETHAMINE 60 MG/2ML IM SOLN
INTRAMUSCULAR | Status: AC
  Filled 2020-05-18: qty 2

## 2020-05-18 NOTE — ED Provider Notes (Signed)
[facility]   MRN: [id number] DOB: May 01, 1996  Subjective:   Ebony Snyder is a 24 y.o. female presenting for several month hx of persistent pelvic cramping, started having dyspareunia 2 days ago.  Was working with a gynecologist on this got started on OCP.  Has a history of trichomoniasis infection.  Was taking OCP but is no longer on this.  Denies vaginal discharge, urinary symptoms.  No current facility-administered medications for this encounter.  Current Outpatient Medications:  .  naproxen sodium (ANAPROX DS) 550 MG tablet, Take 1 tablet (550 mg total) by mouth 2 (two) times daily with a meal. Use prn cramps, Disp: 30 tablet, Rfl: 2 .  norethindrone-ethinyl estradiol (LOESTRIN FE) 1-20 MG-MCG tablet, Take 1 tablet by mouth daily., Disp: 3 Package, Rfl: 0   No Known Allergies  Past Medical History:  Diagnosis Date  . Anxiety   . Concussion   . Depression   . Dysmenorrhea   . Migraine without aura   . STD (sexually transmitted disease)      Past Surgical History:  Procedure Laterality Date  . MOUTH SURGERY      Family History  Problem Relation Age of Onset  . Hypertension Mother   . Diabetes Maternal Grandfather     Social History   Tobacco Use  . Smoking status: Never Smoker  . Smokeless tobacco: Never Used  Vaping Use  . Vaping Use: Never used  Substance Use Topics  . Alcohol use: Yes    Comment: occ  . Drug use: No    ROS   Objective:   Vitals: BP 128/83 (BP Location: Left Arm)   Pulse 63   Temp 98 F (36.7 C) (Oral)   Resp 18   LMP  (Within Weeks) Comment: 3 weeks  SpO2 100%   Physical Exam Constitutional:      General: She is not in acute distress.    Appearance: Normal appearance. She is well-developed. She is not ill-appearing, toxic-appearing or diaphoretic.  HENT:     Head: Normocephalic and atraumatic.     Nose: Nose normal.     Mouth/Throat:     Mouth: Mucous membranes are moist.     Pharynx: Oropharynx is clear.    Eyes:     General: No scleral icterus.       Right eye: No discharge.        Left eye: No discharge.     Extraocular Movements: Extraocular movements intact.     Conjunctiva/sclera: Conjunctivae normal.     Pupils: Pupils are equal, round, and reactive to light.  Cardiovascular:     Rate and Rhythm: Normal rate.  Pulmonary:     Effort: Pulmonary effort is normal.  Abdominal:     General: Bowel sounds are normal. There is no distension.     Palpations: Abdomen is soft. There is no mass.     Tenderness: There is abdominal tenderness (generalized throughout, mild). There is no right CVA tenderness, left CVA tenderness, guarding or rebound.  Skin:    General: Skin is warm and dry.  Neurological:     General: No focal deficit present.     Mental Status: She is alert and oriented to person, place, and time.  Psychiatric:        Mood and Affect: Mood normal.        Behavior: Behavior normal.        Thought Content: Thought content normal.        Judgment: Judgment normal.  Results for orders placed or performed during the hospital encounter of 05/18/20 (from the past 24 hour(s))  POC urine pregnancy     Status: None   Collection Time: 05/18/20  8:39 PM  Result Value Ref Range   Preg Test, Ur NEGATIVE NEGATIVE    Assessment and Plan :   PDMP not reviewed this encounter.  1. Abdominal cramping   2. Pelvic pain in female   3. Dyspareunia in female     IM Toradol in clinic.  Celebrex for pain control.  Labs pending.  Emphasized need to follow-up with gynecologist, patient would benefit tremendously from pelvic and transvaginal ultrasound. Counseled patient on potential for adverse effects with medications prescribed/recommended today, ER and return-to-clinic precautions discussed, patient verbalized understanding.    Wallis Bamberg, PA-C 05/18/20 2130

## 2020-05-18 NOTE — ED Triage Notes (Signed)
Pt presents with abdominal cramps for the last several months; pain and discomfort during sex for the past 2 days. Denies vaginal discharge.

## 2020-05-19 LAB — CERVICOVAGINAL ANCILLARY ONLY
Bacterial Vaginitis (gardnerella): POSITIVE — AB
Candida Glabrata: NEGATIVE
Candida Vaginitis: POSITIVE — AB
Chlamydia: NEGATIVE
Comment: NEGATIVE
Comment: NEGATIVE
Comment: NEGATIVE
Comment: NEGATIVE
Comment: NEGATIVE
Comment: NORMAL
Neisseria Gonorrhea: NEGATIVE
Trichomonas: NEGATIVE

## 2020-05-19 LAB — POCT URINALYSIS DIP (DEVICE)
Bilirubin Urine: NEGATIVE
Glucose, UA: NEGATIVE mg/dL
Hgb urine dipstick: NEGATIVE
Ketones, ur: NEGATIVE mg/dL
Leukocytes,Ua: NEGATIVE
Nitrite: NEGATIVE
Protein, ur: NEGATIVE mg/dL
Specific Gravity, Urine: >1.03 — ABNORMAL HIGH (ref 1.005–1.030)
Urobilinogen, UA: 1 mg/dL (ref 0.0–1.0)
pH: 5.5 (ref 5.0–8.0)

## 2020-05-24 ENCOUNTER — Telehealth: Payer: Self-pay

## 2020-05-24 DIAGNOSIS — N760 Acute vaginitis: Secondary | ICD-10-CM

## 2020-05-24 DIAGNOSIS — B379 Candidiasis, unspecified: Secondary | ICD-10-CM

## 2020-05-24 MED ORDER — FLUCONAZOLE 200 MG PO TABS: 200.0000 mg | ORAL_TABLET | Freq: Every day | ORAL | 0 refills | Status: DC

## 2020-05-24 MED ORDER — FLUCONAZOLE 200 MG PO TABS: ORAL_TABLET | ORAL | 0 refills | Status: DC

## 2020-05-24 MED ORDER — METRONIDAZOLE 500 MG PO TABS: 500.0000 mg | ORAL_TABLET | Freq: Two times a day (BID) | ORAL | 0 refills | Status: DC

## 2020-06-23 ENCOUNTER — Ambulatory Visit (INDEPENDENT_AMBULATORY_CARE_PROVIDER_SITE_OTHER): Payer: 59 | Admitting: Obstetrics and Gynecology

## 2020-06-23 ENCOUNTER — Encounter: Payer: Self-pay | Admitting: Obstetrics and Gynecology

## 2020-06-23 ENCOUNTER — Other Ambulatory Visit: Payer: Self-pay

## 2020-06-23 VITALS — BP 126/82 | HR 81 | Ht 62.0 in | Wt 179.0 lb

## 2020-06-23 DIAGNOSIS — R102 Pelvic and perineal pain: Secondary | ICD-10-CM

## 2020-06-23 DIAGNOSIS — Z3009 Encounter for other general counseling and advice on contraception: Secondary | ICD-10-CM

## 2020-06-23 DIAGNOSIS — N946 Dysmenorrhea, unspecified: Secondary | ICD-10-CM | POA: Diagnosis not present

## 2020-06-23 MED ORDER — TWIRLA 120-30 MCG/24HR TD PTWK: MEDICATED_PATCH | TRANSDERMAL | 0 refills | Status: DC

## 2020-06-23 NOTE — Progress Notes (Signed)
GYNECOLOGY  VISIT   HPI: 24 y.o.   Single Black or African American Not Hispanic or Latino  female   No obstetric history on file. with No LMP recorded.   here for Follow up OCPs. She has been having cramping daily since February. She notices the cramping when she sits for a long time. She is not using the birth control pills.  At the time of her annual exam in 3/21 she was c/o a several month h/o constant pelvic cramping, worse after intercourse. Negative std testing. No bowel or bladder c/o. Her exam was only significant for mild tenderness in the right adnexa.  She also c/o severe dysmenorrhea. She was given a trial of OCP's. She tried them for a few weeks, couldn't take them consistently secondary to her work schedule.   She was seen in the ER last month with cramping. Negative UPT, negative STD cervical cultures. She did have yeast and BV. Both were treated.  Her pain is daily, waxes and wanes throughout the day. Pain is worse with sitting for a long time. The pain is aching and cramping in her very low abdomen on the lateral right and lateral left. Pain from a 3-7/10 in severity. It was up to a 10/10 when she was seen in the ER. Since the antibiotics for BV and yeast she feels a little better. She has normal BM daily, occasionally needs to strain. No change in pain surrounding BM's, normal bladder function. Normal monthly cycles with severe dysmenorrhea.  Same sexual partner, uses condoms on and off.   GYNECOLOGIC HISTORY: No LMP recorded. Contraception: Condoms  Menopausal hormone therapy: none         OB History   No obstetric history on file.        There are no problems to display for this patient.   Past Medical History:  Diagnosis Date   Anxiety    Concussion    Depression    Dysmenorrhea    Migraine without aura    STD (sexually transmitted disease)     Past Surgical History:  Procedure Laterality Date   MOUTH SURGERY      Current Outpatient Medications   Medication Sig Dispense Refill   celecoxib (CELEBREX) 200 MG capsule Take 1 capsule (200 mg total) by mouth 2 (two) times daily. 30 capsule 0   fluconazole (DIFLUCAN) 200 MG tablet Take one tablet now. Repeat in 3 days if you are still experiencing symptoms. 2 tablet 0   metroNIDAZOLE (FLAGYL) 500 MG tablet Take 1 tablet (500 mg total) by mouth 2 (two) times daily. 14 tablet 0   norethindrone-ethinyl estradiol (LOESTRIN FE) 1-20 MG-MCG tablet Take 1 tablet by mouth daily. 3 Package 0   No current facility-administered medications for this visit.     ALLERGIES: Patient has no known allergies.  Family History  Problem Relation Age of Onset   Hypertension Mother    Diabetes Maternal Grandfather     Social History   Socioeconomic History   Marital status: Single    Spouse name: Not on file   Number of children: Not on file   Years of education: Not on file   Highest education level: Not on file  Occupational History   Not on file  Tobacco Use   Smoking status: Never Smoker   Smokeless tobacco: Never Used  Vaping Use   Vaping Use: Never used  Substance and Sexual Activity   Alcohol use: Yes    Comment: occ   Drug  use: No   Sexual activity: Yes    Birth control/protection: None  Other Topics Concern   Not on file  Social History Narrative   Not on file   Social Determinants of Health   Financial Resource Strain:    Difficulty of Paying Living Expenses: Not on file  Food Insecurity:    Worried About Programme researcher, broadcasting/film/video in the Last Year: Not on file   [employer] of Food in the Last Year: Not on file  Transportation Needs:    Lack of Transportation (Medical): Not on file   Lack of Transportation (Non-Medical): Not on file  Physical Activity:    Days of Exercise per Week: Not on file   Minutes of Exercise per Session: Not on file  Stress:    Feeling of Stress : Not on file  Social Connections:    Frequency of Communication with Friends and  Family: Not on file   Frequency of Social Gatherings with Friends and Family: Not on file   Attends Religious Services: Not on file   Active Member of Clubs or Organizations: Not on file   Attends Banker Meetings: Not on file   Marital Status: Not on file  Intimate Partner Violence:    Fear of Current or Ex-Partner: Not on file   Emotionally Abused: Not on file   Physically Abused: Not on file   Sexually Abused: Not on file    Review of Systems  Gastrointestinal: Positive for abdominal pain (lower cramping ).  All other systems reviewed and are negative.   PHYSICAL EXAMINATION:    There were no vitals taken for this visit.    General appearance: alert, cooperative and appears stated age Abdomen: soft, non-tender; non distended, no masses,  no organomegaly. Not tender with palpation of tensed abdominal wall  Pelvic: External genitalia:  no lesions              Urethra:  normal appearing urethra with no masses, tenderness or lesions              Bartholins and Skenes: normal                 Vagina: normal appearing vagina with normal color and discharge, no lesions              Cervix: no cervical motion tenderness and no lesions              Bimanual Exam:  Uterus:  normal size, contour, position, consistency, mobility, non-tender and anteverted              Adnexa: no mass, fullness, tenderness              Pelvic floor: not tender  Chaperone was present for exam.  ASSESSMENT Pelvic pain, labs reviewed from the ER last month including a negative UPT, negative gonorrhea/chlamydia/trich, +BV/Yeast (both treated) Severe dysmenorrhea Contraception, mostly using condoms   PLAN Return for pelvic ultrasound Start the contraception patch (declines the ring). No contraindication, risks reviewed. Information given  Further plans after the ultrasound and trial of the patch    Over 30 minutes in total patient care.

## 2020-06-23 NOTE — Patient Instructions (Addendum)
Contraception Choices Contraception, also called birth control, refers to methods or devices that prevent pregnancy. Hormonal methods Contraceptive implant  A contraceptive implant is a thin, plastic tube that contains a hormone. It is inserted into the upper part of the arm. It can remain in place for up to 3 years. Progestin-only injections Progestin-only injections are injections of progestin, a synthetic form of the hormone progesterone. They are given every 3 months by a health care provider. Birth control pills  Birth control pills are pills that contain hormones that prevent pregnancy. They must be taken once a day, preferably at the same time each day. Birth control patch  The birth control patch contains hormones that prevent pregnancy. It is placed on the skin and must be changed once a week for three weeks and removed on the fourth week. A prescription is needed to use this method of contraception. Vaginal ring  A vaginal ring contains hormones that prevent pregnancy. It is placed in the vagina for three weeks and removed on the fourth week. After that, the process is repeated with a new ring. A prescription is needed to use this method of contraception. Emergency contraceptive Emergency contraceptives prevent pregnancy after unprotected sex. They come in pill form and can be taken up to 5 days after sex. They work best the sooner they are taken after having sex. Most emergency contraceptives are available without a prescription. This method should not be used as your only form of birth control. Barrier methods Female condom  A female condom is a thin sheath that is worn over the penis during sex. Condoms keep sperm from going inside a woman's body. They can be used with a spermicide to increase their effectiveness. They should be disposed after a single use. Female condom  A female condom is a soft, loose-fitting sheath that is put into the vagina before sex. The condom keeps sperm  from going inside a woman's body. They should be disposed after a single use. Diaphragm  A diaphragm is a soft, dome-shaped barrier. It is inserted into the vagina before sex, along with a spermicide. The diaphragm blocks sperm from entering the uterus, and the spermicide kills sperm. A diaphragm should be left in the vagina for 6-8 hours after sex and removed within 24 hours. A diaphragm is prescribed and fitted by a health care provider. A diaphragm should be replaced every 1-2 years, after giving birth, after gaining more than 15 lb (6.8 kg), and after pelvic surgery. Cervical cap  A cervical cap is a round, soft latex or plastic cup that fits over the cervix. It is inserted into the vagina before sex, along with spermicide. It blocks sperm from entering the uterus. The cap should be left in place for 6-8 hours after sex and removed within 48 hours. A cervical cap must be prescribed and fitted by a health care provider. It should be replaced every 2 years. Sponge  A sponge is a soft, circular piece of polyurethane foam with spermicide on it. The sponge helps block sperm from entering the uterus, and the spermicide kills sperm. To use it, you make it wet and then insert it into the vagina. It should be inserted before sex, left in for at least 6 hours after sex, and removed and thrown away within 30 hours. Spermicides Spermicides are chemicals that kill or block sperm from entering the cervix and uterus. They can come as a cream, jelly, suppository, foam, or tablet. A spermicide should be inserted into the   vagina with an applicator at least 10-15 minutes before sex to allow time for it to work. The process must be repeated every time you have sex. Spermicides do not require a prescription. Intrauterine contraception Intrauterine device (IUD) An IUD is a T-shaped device that is put in a woman's uterus. There are two types:  Hormone IUD.This type contains progestin, a synthetic form of the hormone  progesterone. This type can stay in place for 3-5 years.  Copper IUD.This type is wrapped in copper wire. It can stay in place for 10 years.  Permanent methods of contraception Female tubal ligation In this method, a woman's fallopian tubes are sealed, tied, or blocked during surgery to prevent eggs from traveling to the uterus. Hysteroscopic sterilization In this method, a small, flexible insert is placed into each fallopian tube. The inserts cause scar tissue to form in the fallopian tubes and block them, so sperm cannot reach an egg. The procedure takes about 3 months to be effective. Another form of birth control must be used during those 3 months. Female sterilization This is a procedure to tie off the tubes that carry sperm (vasectomy). After the procedure, the man can still ejaculate fluid (semen). Natural planning methods Natural family planning In this method, a couple does not have sex on days when the woman could become pregnant. Calendar method This means keeping track of the length of each menstrual cycle, identifying the days when pregnancy can happen, and not having sex on those days. Ovulation method In this method, a couple avoids sex during ovulation. Symptothermal method This method involves not having sex during ovulation. The woman typically checks for ovulation by watching changes in her temperature and in the consistency of cervical mucus. Post-ovulation method In this method, a couple waits to have sex until after ovulation. Summary  Contraception, also called birth control, means methods or devices that prevent pregnancy.  Hormonal methods of contraception include implants, injections, pills, patches, vaginal rings, and emergency contraceptives.  Barrier methods of contraception can include female condoms, female condoms, diaphragms, cervical caps, sponges, and spermicides.  There are two types of IUDs (intrauterine devices). An IUD can be put in a woman's uterus to  prevent pregnancy for 3-5 years.  Permanent sterilization can be done through a procedure for males, females, or both.  Natural family planning methods involve not having sex on days when the woman could become pregnant. This information is not intended to replace advice given to you by your health care provider. Make sure you discuss any questions you have with your health care provider. Document Revised: 10/24/2017 Document Reviewed: 11/24/2016 Elsevier Patient Education  2020 [employer].  Ethinyl Estradiol; Levonorgestrel skin patches What is this medicine? ETHINYL ESTRADIOL; LEVONORGESTREL (ETH in il es tra DYE ole; LEE voh nor jes trel) skin patch is used as a contraceptive (birth control method). This medicine combines two types of female hormones, an estrogen and a progestin. This patch is used to prevent ovulation and pregnancy. This medicine may be used for other purposes; ask your health care provider or pharmacist if you have questions. COMMON BRAND NAME(S): Tory Emerald What should I tell my health care provider before I take this medicine? They need to know if you have any of these conditions:  abnormal vaginal bleeding  blood vessel disease or blood clots  breast, cervical, endometrial, ovarian, liver, or uterine cancer  diabetes  gallbladder disease  having surgery  heart disease or recent heart attack  high blood pressure  high cholesterol  or triglycerides  history of irregular heartbeat or heart valve problems  kidney disease  liver disease  migraine headaches  protein C deficiency  protein S deficiency  recently had a baby, miscarriage, or abortion  stroke  systemic lupus erythematosus (SLE)  tobacco smoker  an unusual or allergic reaction to estrogens, progestins, other medicines, foods, dyes, or preservatives  pregnant or trying to get pregnant  breast-feeding How should I use this medicine? This patch is applied to the skin. Follow the  directions on the prescription label. Apply to clean, dry, healthy skin on the buttock, abdomen, upper outer arm or upper torso, in a place where it will not be rubbed by tight clothing. Do not use lotions or other cosmetics on the site where the patch will go. Press the patch firmly in place for 10 seconds to ensure good contact with the skin. Change the patch every 7 days on the same day of the week for 3 weeks. You will then have a break from the patch for 1 week, after which you will apply a new patch. Do not use your medicine more often than directed. Contact your pediatrician regarding the use of this medicine in children. Special care may be needed. This medicine has been used in female children who have started having menstrual periods. A patient package insert for the product will be given with each prescription and refill. Read this sheet carefully each time. The sheet may change frequently. Overdosage: If you think you have taken too much of this medicine contact a poison control center or emergency room at once. NOTE: This medicine is only for you. Do not share this medicine with others. What if I miss a dose? You will need to replace your patch once a week as directed. If your patch is lost or falls off, contact your health care professional for advice. You may need to use another form of birth control if your patch has been off for more than 1 day. What may interact with this medicine? Do not take this medicine with the following medications:  dasabuvir; ombitasvir; paritaprevir; ritonavir  ombitasvir; paritaprevir; ritonavir This medicine may also interact with the following medications:  acetaminophen  antibiotics or medicines for infections, especially rifampin, rifabutin, rifapentine, and possibly penicillins or tetracyclines  aprepitant or fosaprepitant  armodafinil  ascorbic acid (vitamin C)  barbiturate medicines, such as phenobarbital or primidone  bosentan  certain  antiviral medicines for hepatitis, HIV or AIDS  certain medicines for cancer treatment  certain medicines for seizures like carbamazepine, clobazam, felbamate, lamotrigine, oxcarbazepine, phenytoin, rufinamide, topiramate  certain medicines for treating high cholesterol  cyclosporine  dantrolene  elagolix  flibanserin  grapefruit juice  lesinurad  medicines for diabetes  medicines to treat fungal infections, such as griseofulvin, miconazole, fluconazole, ketoconazole, itraconazole, posaconazole or voriconazole  mifepristone  mitotane  modafinil  morphine  mycophenolate  St. John's wort  tamoxifen  temazepam  theophylline or aminophylline  thyroid hormones  tizanidine  tranexamic acid  ulipristal  warfarin This list may not describe all possible interactions. Give your health care provider a list of all the medicines, herbs, non-prescription drugs, or dietary supplements you use. Also tell them if you smoke, drink alcohol, or use illegal drugs. Some items may interact with your medicine. What should I watch for while using this medicine? Visit your doctor or health care professional for regular checks on your progress. You will need a regular breast and pelvic exam and Pap smear while on this medicine.  If you have any reason to think you are pregnant, stop using this medicine right away and contact your doctor or health care professional. If you are using this medicine for hormone-related problems, it may take several cycles of use to see improvement in your condition. Smoking increases the risk of getting a blood clot or having a stroke while you are using hormonal birth control, especially if you are more than 25 years old. You are strongly advised not to smoke. This medicine can make your body retain fluid, making your fingers, hands, or ankles swell. Your blood pressure can go up. Contact your doctor or health care professional if you feel you are retaining  fluid. This medicine can make you more sensitive to the sun. Keep out of the sun. If you cannot avoid being in the sun, wear protective clothing and use sunscreen. Do not use sun lamps or tanning beds/booths. If you wear contact lenses and notice visual changes, or if the lenses begin to feel uncomfortable, consult your eye care specialist. In some women, tenderness, swelling, or minor bleeding of the gums may occur. Notify your dentist if this happens. Brushing and flossing your teeth regularly may help limit this. See your dentist regularly and inform your dentist of the medicines you are taking. If you are going to have elective surgery or an MRI, you may need to stop using this medicine before the surgery or MRI. Consult your health care professional for advice. This medicine does not protect you against HIV infection (AIDS) or any other sexually transmitted diseases. What side effects may I notice from receiving this medicine? Side effects that you should report to your doctor or health care professional as soon as possible:  allergic reactions such as skin rash or itching, hives, swelling of the lips, mouth, tongue, or throat  breast tissue changes or discharge  dark patches of skin on your forehead, cheeks, upper lip, and chin  depression  high blood pressure  migraines or severe, sudden headaches  missed menstrual periods  signs and symptoms of a blood clot such as breathing problems; changes in vision; chest pain; severe, sudden headache; pain, swelling, warmth in the leg; trouble speaking; sudden numbness or weakness of the face, arm or leg  skin reactions at the patch site such as blistering, bleeding, itching, rash, or swelling  stomach pain  yellowing of the eyes or skin Side effects that usually do not require medical attention (report these to your doctor or health care professional if they continue or are bothersome):  breast tenderness  irregular vaginal bleeding or  spotting, particularly during the first 3 months of use  headache  nausea  painful menstrual periods  skin redness or mild irritation at site where applied  weight gain (slight) This list may not describe all possible side effects. Call your doctor for medical advice about side effects. You may report side effects to FDA at 1-800-FDA-1088. Where should I keep my medicine? Keep out of the reach of children. Store at room temperature between 15 and 30 degrees C (59 and 86 degrees F). Keep the patch in its pouch until time of use. Throw away any unused medicine after the expiration date. Dispose of used patches properly. Since a used patch may still contain active hormones, fold the patch in half so that it sticks to itself prior to disposal. Throw away in a place where children or pets cannot reach. NOTE: This sheet is a summary. It may not cover all possible information.  If you have questions about this medicine, talk to your doctor, pharmacist, or health care provider.  2020 Elsevier/Gold Standard (2019-01-23 12:53:32)

## 2020-06-27 ENCOUNTER — Telehealth: Payer: Self-pay | Admitting: Obstetrics and Gynecology

## 2020-06-27 NOTE — Telephone Encounter (Signed)
Spoke with patient regarding benefits for recommended ultrasound. Patient is aware that ultrasound is transvaginal. Patient acknowledges understanding of information presented. Patient is aware of cancellation policy. Patient scheduled appointment for 06/30/2020 at 0230PM with Gertie Exon, MD. Encounter closed.

## 2020-06-29 ENCOUNTER — Other Ambulatory Visit: Payer: Medicaid Other

## 2020-06-30 ENCOUNTER — Encounter: Payer: Self-pay | Admitting: Obstetrics and Gynecology

## 2020-06-30 ENCOUNTER — Ambulatory Visit (INDEPENDENT_AMBULATORY_CARE_PROVIDER_SITE_OTHER): Payer: 59

## 2020-06-30 ENCOUNTER — Ambulatory Visit (INDEPENDENT_AMBULATORY_CARE_PROVIDER_SITE_OTHER): Payer: 59 | Admitting: Obstetrics and Gynecology

## 2020-06-30 ENCOUNTER — Other Ambulatory Visit: Payer: Self-pay

## 2020-06-30 VITALS — BP 110/70 | HR 68 | Wt 180.0 lb

## 2020-06-30 DIAGNOSIS — N946 Dysmenorrhea, unspecified: Secondary | ICD-10-CM

## 2020-06-30 DIAGNOSIS — R102 Pelvic and perineal pain: Secondary | ICD-10-CM | POA: Diagnosis not present

## 2020-06-30 NOTE — Progress Notes (Signed)
GYNECOLOGY  VISIT   HPI: 24 y.o.   Single Black or African American Not Hispanic or Latino  female   No obstetric history on file. with Patient's last menstrual period was 06/06/2020.   here for evaluation of pelvic pain.  See 06/23/20 note.   GYNECOLOGIC HISTORY: Patient's last menstrual period was 06/06/2020. Contraception:contraception patch Menopausal hormone therapy: none        OB History   No obstetric history on file.        There are no problems to display for this patient.   Past Medical History:  Diagnosis Date   Anxiety    Concussion    Depression    Dysmenorrhea    Migraine without aura    STD (sexually transmitted disease)     Past Surgical History:  Procedure Laterality Date   MOUTH SURGERY      Current Outpatient Medications  Medication Sig Dispense Refill   Levonorgestrel-Eth Estradiol (TWIRLA) 120-30 MCG/24HR PTWK Apply one patch to your lower abdomen or lower back, change weekly x 3 weeks, then one week off of the patch. Reapply a new patch within one week of removing the last patch. (Patient not taking: Reported on 06/30/2020) 9 patch 0   No current facility-administered medications for this visit.     ALLERGIES: Patient has no known allergies.  Family History  Problem Relation Age of Onset   Hypertension Mother    Diabetes Maternal Grandfather     Social History   Socioeconomic History   Marital status: Single    Spouse name: Not on file   Number of children: Not on file   Years of education: Not on file   Highest education level: Not on file  Occupational History   Not on file  Tobacco Use   Smoking status: Never Smoker   Smokeless tobacco: Never Used  Vaping Use   Vaping Use: Never used  Substance and Sexual Activity   Alcohol use: Yes    Comment: occ   Drug use: No   Sexual activity: Yes    Birth control/protection: Condom    Comment: condoms 50% of the time  Other Topics Concern   Not on file   Social History Narrative   Not on file   Social Determinants of Health   Financial Resource Strain:    Difficulty of Paying Living Expenses: Not on file  Food Insecurity:    Worried About Programme researcher, broadcasting/film/video in the Last Year: Not on file   [employer] of Food in the Last Year: Not on file  Transportation Needs:    Lack of Transportation (Medical): Not on file   Lack of Transportation (Non-Medical): Not on file  Physical Activity:    Days of Exercise per Week: Not on file   Minutes of Exercise per Session: Not on file  Stress:    Feeling of Stress : Not on file  Social Connections:    Frequency of Communication with Friends and Family: Not on file   Frequency of Social Gatherings with Friends and Family: Not on file   Attends Religious Services: Not on file   Active Member of Clubs or Organizations: Not on file   Attends Banker Meetings: Not on file   Marital Status: Not on file  Intimate Partner Violence:    Fear of Current or Ex-Partner: Not on file   Emotionally Abused: Not on file   Physically Abused: Not on file   Sexually Abused: Not on file  ROS  PHYSICAL EXAMINATION:    BP 110/70    Pulse 68    Wt 180 lb (81.6 kg)    LMP 06/06/2020    BMI 32.92 kg/m     General appearance: alert, cooperative and appears stated age  Normal ultrasound images were reviewed with the patient.   ASSESSMENT Pelvic pain, negative w/u. Just started on the contraceptive patch F/U in 2-3 months to see how her pain is and f/u on the patch in general.     PLAN    An After Visit Summary was printed and given to the patient.

## 2020-07-01 ENCOUNTER — Other Ambulatory Visit: Payer: Medicaid Other

## 2020-07-15 ENCOUNTER — Encounter: Payer: Self-pay | Admitting: Obstetrics and Gynecology

## 2020-07-15 ENCOUNTER — Telehealth: Payer: Self-pay | Admitting: *Deleted

## 2020-07-15 NOTE — Telephone Encounter (Signed)
See telephone encounter dated 07/15/20.   Encounter closed.  

## 2020-07-15 NOTE — Telephone Encounter (Signed)
[facility] Good afternoon Dr. Reyne Dumas! I have a question in follow-up of the ultrasound visit. Everything seemed fine however I've just had a positive pregnancy test. Is it possible I was too early on in this pregnancy for it to be detected on during the ultrasound?    Thank you,   Ebony Snyder

## 2020-07-15 NOTE — Telephone Encounter (Signed)
Spoke with patient. Patient was seen in office for evaluation of pelvic pain on 06/23/20, returned for PUS on 8/26. Was given RX for contraceptive patch to start with next menses. LMP 06/06/20. Positive UPT on 07/14/20. Did not start patch. Denies any new pelvic pain, vag bleeding, N/V, fever/chills. Hx trichomonas.   OV scheduled for 07/18/20 at 11:30 am with Dr. Oscar La.  ER precautions provided for severe pain/ vag bleeding.   Routing to Covering provider, Dr. Hyacinth Meeker,  for final review.   Cc: Dr. Oscar La

## 2020-07-16 ENCOUNTER — Inpatient Hospital Stay (HOSPITAL_COMMUNITY): Payer: 59

## 2020-07-16 ENCOUNTER — Encounter (HOSPITAL_COMMUNITY): Payer: Self-pay | Admitting: Obstetrics & Gynecology

## 2020-07-16 ENCOUNTER — Inpatient Hospital Stay (HOSPITAL_COMMUNITY)
Admission: AD | Admit: 2020-07-16 | Discharge: 2020-07-16 | Disposition: A | Discharge status: Home / Self Care | Payer: 59 | Attending: Obstetrics & Gynecology | Admitting: Obstetrics & Gynecology

## 2020-07-16 ENCOUNTER — Other Ambulatory Visit: Payer: Self-pay

## 2020-07-16 DIAGNOSIS — R1030 Lower abdominal pain, unspecified: Secondary | ICD-10-CM

## 2020-07-16 DIAGNOSIS — O26891 Other specified pregnancy related conditions, first trimester: Secondary | ICD-10-CM | POA: Insufficient documentation

## 2020-07-16 DIAGNOSIS — B9689 Other specified bacterial agents as the cause of diseases classified elsewhere: Secondary | ICD-10-CM

## 2020-07-16 DIAGNOSIS — O99891 Other specified diseases and conditions complicating pregnancy: Secondary | ICD-10-CM

## 2020-07-16 DIAGNOSIS — Z3A01 Less than 8 weeks gestation of pregnancy: Secondary | ICD-10-CM | POA: Diagnosis not present

## 2020-07-16 LAB — CBC
HCT: 38.9 % (ref 36.0–46.0)
Hemoglobin: 13 g/dL (ref 12.0–15.0)
MCH: 29.9 pg (ref 26.0–34.0)
MCHC: 33.4 g/dL (ref 30.0–36.0)
MCV: 89.4 fL (ref 80.0–100.0)
Platelets: 286 10*3/uL (ref 150–400)
RBC: 4.35 MIL/uL (ref 3.87–5.11)
RDW: 12.8 % (ref 11.5–15.5)
WBC: 8.8 10*3/uL (ref 4.0–10.5)
nRBC: 0 % (ref 0.0–0.2)

## 2020-07-16 LAB — WET PREP, GENITAL
Sperm: NONE SEEN
Trich, Wet Prep: NONE SEEN
Yeast Wet Prep HPF POC: NONE SEEN

## 2020-07-16 LAB — URINALYSIS, ROUTINE W REFLEX MICROSCOPIC
Bilirubin Urine: NEGATIVE
Glucose, UA: NEGATIVE mg/dL
Hgb urine dipstick: NEGATIVE
Ketones, ur: NEGATIVE mg/dL
Leukocytes,Ua: NEGATIVE
Nitrite: NEGATIVE
Protein, ur: NEGATIVE mg/dL
Specific Gravity, Urine: 1.015 (ref 1.005–1.030)
pH: 7 (ref 5.0–8.0)

## 2020-07-16 LAB — ABO/RH: ABO/RH(D): B POS

## 2020-07-16 LAB — HCG, QUANTITATIVE, PREGNANCY: hCG, Beta Chain, Quant, S: 31381 m[IU]/mL — ABNORMAL HIGH (ref <5)

## 2020-07-16 LAB — POCT PREGNANCY, URINE: Preg Test, Ur: POSITIVE — AB

## 2020-07-16 MED ORDER — METRONIDAZOLE 0.75 % VA GEL: 1.0000 | Freq: Every day | VAGINAL | 0 refills | Status: DC

## 2020-07-16 NOTE — MAU Note (Signed)
Called pt from lobby No answer 

## 2020-07-16 NOTE — MAU Provider Note (Signed)
History     CSN: [id number]  Arrival date and time: 07/16/20 8502   First Provider Initiated Contact with Patient 07/16/20 2208      Chief Complaint  Patient presents with   Abdominal Pain   Possible Pregnancy   Ebony Snyder is a 24 y.o. G1P0 at [redacted]w[redacted]d by Definite LMP of Jun 06, 2020 who plans to receive care at an office on [facility], but is unsure of the name.  Upon provider arrival patient tearful in bed and reports that she is tired and hungry.  Patient states she has been in the waiting room for 3 hours.  She presents today for Abdominal Pain and Possible Pregnancy.  Patient states she has been experiencing pain for the past "couple of days." She reports the pain is intermittent and is not worsened or improved with any known factors.  She is unsure of how long the pain lasts and describes it as "throbbing." She states the pain is located in "the whole bottom of my lower abdomen."  She reports sexual activity on Thursday.    OB History    Gravida  1   Para      Term      Preterm      AB      Living        SAB      TAB      Ectopic      Multiple      Live Births              Past Medical History:  Diagnosis Date   Anxiety    Concussion    Depression    Dysmenorrhea    Migraine without aura    STD (sexually transmitted disease)     Past Surgical History:  Procedure Laterality Date   MOUTH SURGERY      Family History  Problem Relation Age of Onset   Hypertension Mother    Diabetes Maternal Grandfather     Social History   Tobacco Use   Smoking status: Never Smoker   Smokeless tobacco: Never Used  Vaping Use   Vaping Use: Never used  Substance Use Topics   Alcohol use: Yes    Comment: occ   Drug use: No    Allergies: No Known Allergies  Medications Prior to Admission  Medication Sig Dispense Refill Last Dose   Levonorgestrel-Eth Estradiol (TWIRLA) 120-30 MCG/24HR PTWK Apply one patch to your lower abdomen or  lower back, change weekly x 3 weeks, then one week off of the patch. Reapply a new patch within one week of removing the last patch. (Patient not taking: Reported on 06/30/2020) 9 patch 0     Review of Systems  Constitutional: Negative for chills and fever.  Respiratory: Negative for cough and shortness of breath.   Gastrointestinal: Positive for abdominal pain, nausea and vomiting. Negative for constipation and diarrhea.  Genitourinary: Negative for difficulty urinating, dyspareunia, dysuria, pelvic pain, vaginal bleeding and vaginal discharge.  Musculoskeletal: Negative for back pain.  Neurological: Negative for dizziness, light-headedness and headaches.   Physical Exam   Blood pressure 121/71, pulse 72, temperature 99.2 F (37.3 C), temperature source Oral, resp. rate 16, height 5\' 2"  (1.575 m), weight 81.5 kg, last menstrual period 06/06/2020, SpO2 100 %.  Physical Exam Vitals reviewed. Exam conducted with a chaperone present.  Constitutional:      Appearance: She is well-developed.  HENT:     Head: Normocephalic and atraumatic.  Eyes:  Conjunctiva/sclera: Conjunctivae normal.  Pulmonary:     Effort: Pulmonary effort is normal. No respiratory distress.  Abdominal:     Palpations: Abdomen is soft.     Tenderness: There is abdominal tenderness in the right lower quadrant.  Genitourinary:    Labia:        Right: No lesion.        Left: No lesion.      Cervix: No cervical motion tenderness, friability or erythema.     Uterus: Not enlarged and not tender.      Comments: Speculum Exam: -Normal External Genitalia: Non tender, Moderate amt watery gray discharge at introitus.  -Vaginal Vault: Pink mucosa with good rugae. No apparent discharge noted -wet prep collected -Cervix:Pink, no lesions, cysts, or polyps.  Appears closed. No active bleeding from os-GC/CT collected -Bimanual Exam: No apparent enlargement. No tenderness.    Skin:    General: Skin is warm and dry.   Neurological:     Mental Status: She is alert.  Psychiatric:        Mood and Affect: Mood normal.        Behavior: Behavior normal.        Thought Content: Thought content normal.     MAU Course  Procedures Results for orders placed or performed during the hospital encounter of 07/16/20 (from the past 24 hour(s))  Pregnancy, urine POC     Status: Abnormal   Collection Time: 07/16/20  7:37 PM  Result Value Ref Range   Preg Test, Ur POSITIVE (A) NEGATIVE  Urinalysis, Routine w reflex microscopic Urine, Clean Catch     Status: None   Collection Time: 07/16/20  7:39 PM  Result Value Ref Range   Color, Urine YELLOW YELLOW   APPearance CLEAR CLEAR   Specific Gravity, Urine 1.015 1.005 - 1.030   pH 7.0 5.0 - 8.0   Glucose, UA NEGATIVE NEGATIVE mg/dL   Hgb urine dipstick NEGATIVE NEGATIVE   Bilirubin Urine NEGATIVE NEGATIVE   Ketones, ur NEGATIVE NEGATIVE mg/dL   Protein, ur NEGATIVE NEGATIVE mg/dL   Nitrite NEGATIVE NEGATIVE   Leukocytes,Ua NEGATIVE NEGATIVE  CBC     Status: None   Collection Time: 07/16/20  8:10 PM  Result Value Ref Range   WBC 8.8 4.0 - 10.5 K/uL   RBC 4.35 3.87 - 5.11 MIL/uL   Hemoglobin 13.0 12.0 - 15.0 g/dL   HCT 32.9 36 - 46 %   MCV 89.4 80.0 - 100.0 fL   MCH 29.9 26.0 - 34.0 pg   MCHC 33.4 30.0 - 36.0 g/dL   RDW 51.8 84.1 - 66.0 %   Platelets 286 150 - 400 K/uL   nRBC 0.0 0.0 - 0.2 %  hCG, quantitative, pregnancy     Status: Abnormal   Collection Time: 07/16/20  8:10 PM  Result Value Ref Range   hCG, Beta Chain, Quant, S 31,381 (H) <5 mIU/mL  ABO/Rh     Status: None   Collection Time: 07/16/20  8:10 PM  Result Value Ref Range   ABO/RH(D) B POS    No rh immune globuloin      NOT A RH IMMUNE GLOBULIN CANDIDATE, PT RH POSITIVE Performed at [facility], [address], [place], [place] [address]   Wet prep, genital     Status: Abnormal   Collection Time: 07/16/20 10:24 PM   Specimen: Vaginal  Result Value Ref Range   Yeast Wet Prep HPF  POC NONE SEEN NONE SEEN   Trich,  Wet Prep NONE SEEN NONE SEEN   Clue Cells Wet Prep HPF POC PRESENT (A) NONE SEEN   WBC, Wet Prep HPF POC FEW (A) NONE SEEN   Sperm NONE SEEN    US OB LESS THAN 14 WEEKS WITH OB TRANSVAGINAL  Result Date: 07/16/2020 CLINICAL DATA:  Initial evaluation for acute abdominal pain, early pregnancy. EXAM: OBSTETRIC <14 WK [place] AND TRANSVAGINAL OB US TECHNIQUE: Both transabdominal and transvaginal ultrasound examinations were performed for complete evaluation of the gestation as well as the maternal uterus, adnexal regions, and pelvic cul-de-sac. Transvaginal technique was performed to assess early pregnancy. COMPARISON:  Prior ultrasound from 06/30/2020. FINDINGS: Intrauterine gestational sac: Single Yolk sac:  Present Embryo:  Present Cardiac Activity: Present Heart Rate: 119 bpm CRL: 3.7 mm   6 w   0 d                  [place] EDC: 03/11/2021 Subchorionic hemorrhage:  None visualized. Maternal uterus/adnexae: Ovaries normal in appearance. No adnexal mass. Trace free fluid within the pelvis. IMPRESSION: 1. Single viable intrauterine pregnancy as above, estimated gestational age [redacted] weeks and 0 day by crown-rump length, with ultrasound EDC of 03/11/2021. No complication. 2. No other acute maternal uterine or adnexal abnormality identified. Electronically Signed   By: Rise Mu M.D.   On: 07/16/2020 23:02     MDM Pelvic Exam; Wet Prep and GC/CT Labs: UA, UPT, CBC, hCG, ABO Ultrasound Assessment and Plan  24 year old G1P0 at 5.5 weeks by LMP Abdominal Pain  -POC Reviewed. -Labs ordered while patient in waiting room and results discussed. -Patient informed of pelvic exam and [place]. -Patient states she would like to eat and rest. -Reassured that exam and [place] would not take long and she can eat, after exam, if suspicion for ectopic is further decreased as hCG levels are high which is reassuring. -Patient agreeable. -Exam performed and findings discussed. -Cultures  collected and pending.  -Patient offered and declines pain medication. -Will order and send to [place].   Cherre Robins 07/16/2020, 10:08 PM   Reassessment (11:21 PM)  -[place] and WP results as above. -Discussed treatment for bacterial vaginosis.  -Rx for metrogel sent to pharmacy on file.  -Reviewed US findings and informed that dates will remain the same. -Bleeding Precautions given. -Given list of providers for initiation of OB care.  -Encouraged to call or return to MAU if symptoms worsen or with the onset of new symptoms. -Discharged to home in stable condition.  Cherre Robins MSN, CNM Advanced Practice Provider, Center for [employer]

## 2020-07-16 NOTE — Discharge Instructions (Signed)
  [facility] Providers          [facility]  [address], , [place] [address]  [phone]  [facility]  [address], [place], [place] [address]  [phone]  [facility]  [address], [place], [place] [address]  [phone]  [facility] [place]  [address], [place], [place] [address]  [phone]  [facility]  [address] floor), [place], [place] [address]  [phone]  [facility] [address], [place], [place] [address] [phone]  [facility]  [address], [place], [place] [address]  [phone]  [facility]  [address], [place], [place] [address]  [phone]  [facility]  [address], [place], [place] [address]  [phone]  [facility]  [address], [place], [place] [address]  [phone]  [facility]  [address], [place], [place] [address]  [phone]  [facility]  [address], [place], [place] [address]  [phone]  [facility]   [address], [place], [place] [address]  [phone]  [facility]  [address], [place], [place] [address]   [phone]  [facility]  [address], ,  [address]  [phone]         

## 2020-07-16 NOTE — MAU Note (Signed)
Ebony Snyder is a 24 y.o. here in MAU reporting: abdominal pain for the past few days, took UPT at home and it was positive. No bleeding. No abnormal discharge.  LMP: 06/06/20  Onset of complaint: ongoing  Pain score: 3/10  Vitals:   07/16/20 1901  BP: 121/71  Pulse: 72  Resp: 16  Temp: 99.2 F (37.3 C)  SpO2: 100%     Lab orders placed from triage: UPT

## 2020-07-16 NOTE — MAU Note (Signed)
Second call not in lobby.

## 2020-07-17 NOTE — Telephone Encounter (Signed)
The patient was seen in the MAU on 07/16/20 with pain, ultrasound with viable IUP at 6 weeks. She doesn't need to be seen here unless she has a concern. She needs to establish care with OB (she was given names while at MAU).

## 2020-07-18 ENCOUNTER — Ambulatory Visit: Payer: Self-pay | Admitting: Obstetrics and Gynecology

## 2020-07-18 ENCOUNTER — Telehealth: Payer: Self-pay | Admitting: Obstetrics and Gynecology

## 2020-07-18 LAB — GC/CHLAMYDIA PROBE AMP (~~LOC~~) NOT AT ARMC
Chlamydia: POSITIVE — AB
Comment: NEGATIVE
Comment: NORMAL
Neisseria Gonorrhea: NEGATIVE

## 2020-07-18 NOTE — Telephone Encounter (Signed)
Patient canceled her pregnancy confirmation appointment today due to not "feeeling well". She stated she will follow up with an OBGYN.

## 2020-07-18 NOTE — Telephone Encounter (Signed)
Spoke with patient. Advised per Dr. Oscar La. Patient request to keep OV as scheduled. Has some additional concerns she would like to discuss. Will keep OV as scheduled for today at 11:30am.   Routing to Dr. Shirley Friar.

## 2020-07-20 ENCOUNTER — Telehealth: Payer: Self-pay

## 2020-07-20 DIAGNOSIS — R110 Nausea: Secondary | ICD-10-CM

## 2020-07-20 DIAGNOSIS — O98819 Other maternal infectious and parasitic diseases complicating pregnancy, unspecified trimester: Secondary | ICD-10-CM

## 2020-07-20 MED ORDER — METOCLOPRAMIDE HCL 10 MG PO TABS: 10.0000 mg | ORAL_TABLET | Freq: Four times a day (QID) | ORAL | 0 refills | Status: DC | PRN

## 2020-07-20 MED ORDER — AZITHROMYCIN 500 MG PO TABS: 1000.0000 mg | ORAL_TABLET | Freq: Once | ORAL | 0 refills | Status: AC

## 2020-07-20 NOTE — Telephone Encounter (Signed)
Ebony Snyder Jul 24, 1996 [id number]   Patient called and verified her identity via birth date and last 4 of her SSN.  Patient agreeable to results via phone and states that she has checked her mychart so she is aware of the nature of the call.  Patient informed of need for treatment and that Zithromax would be sent to pharmacy on file.  Patient instructed to take ~ one hour after eating to reduce incident of nausea and vomiting.  Patient endorses some occasional nausea, with some vomiting after "dry-heaving."  Patient states the nausea does not occur daily. Informed that provider will send a prn medication to pharmacy.  Patient agreeable.  Rx for Zithromax and Reglan sent to pharmacy on file. Patient instructed to abstain from sexual activity for at least 14 days after her partner is treated.  Patient informed that partner can be treated at his PCP or [facility].  Patient verbalized understanding.  No other questions or concerns.  Cherre Robins MSN, CNM Advanced Practice Provider, Center for [facility]  **This visit was completed, in its entirety, via telehealth communications.  I personally spent >/=3 minutes on the phone providing recommendations, education, and guidance.**

## 2020-08-08 ENCOUNTER — Encounter (HOSPITAL_COMMUNITY): Payer: Self-pay | Admitting: Obstetrics and Gynecology

## 2020-08-08 ENCOUNTER — Inpatient Hospital Stay (HOSPITAL_COMMUNITY)
Admission: AD | Admit: 2020-08-08 | Discharge: 2020-08-08 | Disposition: A | Discharge status: Home / Self Care | Payer: 59 | Attending: Obstetrics and Gynecology | Admitting: Obstetrics and Gynecology

## 2020-08-08 ENCOUNTER — Other Ambulatory Visit: Payer: Self-pay

## 2020-08-08 DIAGNOSIS — R109 Unspecified abdominal pain: Secondary | ICD-10-CM | POA: Diagnosis not present

## 2020-08-08 DIAGNOSIS — Z3A09 9 weeks gestation of pregnancy: Secondary | ICD-10-CM | POA: Insufficient documentation

## 2020-08-08 DIAGNOSIS — O26891 Other specified pregnancy related conditions, first trimester: Secondary | ICD-10-CM | POA: Diagnosis present

## 2020-08-08 DIAGNOSIS — Z79899 Other long term (current) drug therapy: Secondary | ICD-10-CM | POA: Diagnosis not present

## 2020-08-08 DIAGNOSIS — O26899 Other specified pregnancy related conditions, unspecified trimester: Secondary | ICD-10-CM | POA: Diagnosis not present

## 2020-08-08 LAB — WET PREP, GENITAL
Sperm: NONE SEEN
Trich, Wet Prep: NONE SEEN
Yeast Wet Prep HPF POC: NONE SEEN

## 2020-08-08 NOTE — MAU Provider Note (Signed)
History     CSN: [id number]  Arrival date and time: 08/08/20 2058   First Provider Initiated Contact with Patient 08/08/20 2154     24 y.o. G1 @9 .0 wks presenting with abdominal cramping. Reports pain started last year before pregnancy. States she had negative STD testing and normal pelvic at that time. Was told she should try OCPs for the pain. Rates pain 7/10. Has not tried anything for it. Denies urinary sx. No VB or discharge.    OB History    Gravida  1   Para      Term      Preterm      AB      Living        SAB      TAB      Ectopic      Multiple      Live Births              Past Medical History:  Diagnosis Date  . Anxiety   . Concussion   . Depression   . Dysmenorrhea   . Migraine without aura   . STD (sexually transmitted disease)     Past Surgical History:  Procedure Laterality Date  . MOUTH SURGERY      Family History  Problem Relation Age of Onset  . Hypertension Mother   . Diabetes Maternal Grandfather     Social History   Tobacco Use  . Smoking status: Never Smoker  . Smokeless tobacco: Never Used  Vaping Use  . Vaping Use: Never used  Substance Use Topics  . Alcohol use: Yes    Comment: occ  . Drug use: No    Allergies: No Known Allergies  Medications Prior to Admission  Medication Sig Dispense Refill Last Dose  . acetaminophen (TYLENOL) 325 MG tablet Take 650 mg by mouth every 6 (six) hours as needed.   08/07/2020 at Unknown time  . metoCLOPramide (REGLAN) 10 MG tablet Take 1 tablet (10 mg total) by mouth every 6 (six) hours as needed for nausea. 30 tablet 0 Past Week at Unknown time  . metroNIDAZOLE (METROGEL VAGINAL) 0.75 % vaginal gel Place 1 Applicatorful vaginally at bedtime. Insert one applicator, at bedtime, for 5 nights. 70 g 0 Past Month at Unknown time    Review of Systems  Constitutional: Negative for chills and fever.  Gastrointestinal: Positive for abdominal pain. Negative for constipation, diarrhea,  nausea and vomiting.  Genitourinary: Negative for dysuria, hematuria, vaginal bleeding and vaginal discharge.   Physical Exam   Blood pressure 123/71, pulse 87, temperature 99.6 F (37.6 C), temperature source Oral, resp. rate 20, height 5\' 2"  (1.575 m), weight 83.9 kg, last menstrual period 06/06/2020.  Physical Exam Vitals and nursing note reviewed. Exam conducted with a chaperone present.  Constitutional:      General: She is not in acute distress.    Appearance: Normal appearance.  HENT:     Head: Normocephalic and atraumatic.  Cardiovascular:     Rate and Rhythm: Normal rate.  Pulmonary:     Effort: Pulmonary effort is normal. No respiratory distress.  Abdominal:     General: There is no distension.     Palpations: Abdomen is soft. There is no mass.     Tenderness: There is no abdominal tenderness. There is no guarding or rebound.     Hernia: No hernia is present.  Genitourinary:    Comments: External: no lesions or erythema Uterus: + enlarged, anteverted, non tender, no  CMT Adnexae: no masses, no tenderness left, no tenderness right Cervix closed/long  Musculoskeletal:        General: Normal range of motion.     Cervical back: Normal range of motion.  Skin:    General: Skin is warm and dry.  Neurological:     General: No focal deficit present.     Mental Status: She is alert and oriented to person, place, and time.  Psychiatric:        Mood and Affect: Mood normal.        Behavior: Behavior normal.   Limited bedside US: viable, active fetus, +cardiac activity, FHR 171, subj. nml AFV  Results for orders placed or performed during the hospital encounter of 08/08/20 (from the past 24 hour(s))  Wet prep, genital     Status: Abnormal   Collection Time: 08/08/20  9:50 PM  Result Value Ref Range   Yeast Wet Prep HPF POC NONE SEEN NONE SEEN   Trich, Wet Prep NONE SEEN NONE SEEN   Clue Cells Wet Prep HPF POC PRESENT (A) NONE SEEN   WBC, Wet Prep HPF POC MANY (A) NONE  SEEN   Sperm NONE SEEN    MAU Course  Procedures  MDM Labs ordered and reviewed. No signs of infection. GC pending. Stable for discharge home.   Assessment and Plan   1. [redacted] weeks gestation of pregnancy   2. Abdominal cramping affecting pregnancy    Discharge home Follow up with [facility] to start care SAB precautions  Allergies as of 08/08/2020   No Known Allergies     Medication List    STOP taking these medications   metroNIDAZOLE 0.75 % vaginal gel Commonly known as: METROGEL VAGINAL     TAKE these medications   acetaminophen 325 MG tablet Commonly known as: TYLENOL Take 650 mg by mouth every 6 (six) hours as needed.   metoCLOPramide 10 MG tablet Commonly known as: REGLAN Take 1 tablet (10 mg total) by mouth every 6 (six) hours as needed for nausea.      Donette Larry, CNM 08/08/2020, 10:35 PM

## 2020-08-08 NOTE — Discharge Instructions (Signed)
Abdominal Pain During Pregnancy  Belly (abdominal) pain is common during pregnancy. There are many possible causes. Most of the time, it is not a serious problem. Other times, it can be a sign that something is wrong with the pregnancy. Always tell your doctor if you have belly pain. Follow these instructions at home:  Do not have sex or put anything in your vagina until your pain goes away completely.  Get plenty of rest until your pain gets better.  Drink enough fluid to keep your pee (urine) pale yellow.  Take over-the-counter and prescription medicines only as told by your doctor.  Keep all follow-up visits as told by your doctor. This is important. Contact a doctor if:  Your pain continues or gets worse after resting.  You have lower belly pain that: ? Comes and goes at regular times. ? Spreads to your back. ? Feels like menstrual cramps.  You have pain or burning when you pee (urinate). Get help right away if:  You have a fever or chills.  You have vaginal bleeding.  You are leaking fluid from your vagina.  You are passing tissue from your vagina.  You throw up (vomit) for more than 24 hours.  You have watery poop (diarrhea) for more than 24 hours.  Your baby is moving less than usual.  You feel very weak or faint.  You have shortness of breath.  You have very bad pain in your upper belly. Summary  Belly (abdominal) pain is common during pregnancy. There are many possible causes.  If you have belly pain during pregnancy, tell your doctor right away.  Keep all follow-up visits as told by your doctor. This is important. This information is not intended to replace advice given to you by your health care provider. Make sure you discuss any questions you have with your health care provider. Document Revised: 02/09/2019 Document Reviewed: 01/24/2017 Elsevier Patient Education  2020 Elsevier Inc.  

## 2020-08-08 NOTE — MAU Note (Signed)
PT SAYS HAS BEEN HAVING LOWER ABD  CRAMPS SINCE NOV EVEN BEFORE PREG. . WAS HERE IN SEPT FOR  STRONGER CRAMPS - NO MEDS FOR PAIN . HAS AN APPOINTMENT  AT [facility] - 08-17-2020- BY PHONE . LAST SEX- 07-12-2020-NO PAIN .

## 2020-08-09 LAB — GC/CHLAMYDIA PROBE AMP (~~LOC~~) NOT AT ARMC
Chlamydia: NEGATIVE
Comment: NEGATIVE
Comment: NORMAL
Neisseria Gonorrhea: NEGATIVE

## 2020-08-16 DIAGNOSIS — Z3480 Encounter for supervision of other normal pregnancy, unspecified trimester: Secondary | ICD-10-CM | POA: Insufficient documentation

## 2020-08-17 ENCOUNTER — Ambulatory Visit (INDEPENDENT_AMBULATORY_CARE_PROVIDER_SITE_OTHER): Payer: Self-pay

## 2020-08-17 DIAGNOSIS — Z3481 Encounter for supervision of other normal pregnancy, first trimester: Secondary | ICD-10-CM

## 2020-08-17 DIAGNOSIS — Z3A10 10 weeks gestation of pregnancy: Secondary | ICD-10-CM

## 2020-08-17 DIAGNOSIS — Z3480 Encounter for supervision of other normal pregnancy, unspecified trimester: Secondary | ICD-10-CM

## 2020-08-17 MED ORDER — BLOOD PRESSURE KIT DEVI: 1.0000 | 0 refills | Status: DC

## 2020-08-17 NOTE — Progress Notes (Signed)
°  Virtual Visit via Telephone Note  I connected with Ebony Snyder on 08/17/20 at  2:00 PM EDT by telephone and verified that I am speaking with the correct person using two identifiers.  Location: Patient: WORK Provider: [facility]   I discussed the limitations, risks, security and privacy concerns of performing an evaluation and management service by telephone and the availability of in person appointments. I also discussed with the patient that there may be a patient responsible charge related to this service. The patient expressed understanding and agreed to proceed.   History of Present Illness: PRENATAL INTAKE SUMMARY  Ebony Snyder presents today New OB Nurse Interview.  OB History    Gravida  1   Para      Term      Preterm      AB      Living        SAB      TAB      Ectopic      Multiple      Live Births             I have reviewed the patient's medical, obstetrical, social, and family histories, medications, and available lab results.  SUBJECTIVE She has no unusual complaints   Observations/Objective: Initial nurse interview for history/labs (New OB)  EDD: 03/13/2021 GA: [redacted]w[redacted]d G1 P0   GENERAL APPEARANCE: oriented to person, place and time  Assessment and Plan: Normal pregnancy Prenatal care @ [facility] Pnl/HIV labs will be done at NOB visit PHQ-9=4 BP Cuff ordered, patient will pick up and take to [facility] visit Pregnancy Risk Screening done Baby Scripts downloaded    Follow Up Instructions:   I discussed the assessment and treatment plan with the patient. The patient was provided an opportunity to ask questions and all were answered. The patient agreed with the plan and demonstrated an understanding of the instructions.   The patient was advised to call back or seek an in-person evaluation if the symptoms worsen or if the condition fails to improve as anticipated.  I provided 15 minutes of non-face-to-face time during this  encounter.   Maretta Bees, RMA

## 2020-08-24 ENCOUNTER — Ambulatory Visit (INDEPENDENT_AMBULATORY_CARE_PROVIDER_SITE_OTHER): Payer: 59 | Admitting: Obstetrics and Gynecology

## 2020-08-24 ENCOUNTER — Other Ambulatory Visit: Payer: Self-pay

## 2020-08-24 VITALS — BP 133/82 | HR 103 | Wt 174.5 lb

## 2020-08-24 DIAGNOSIS — Z3480 Encounter for supervision of other normal pregnancy, unspecified trimester: Secondary | ICD-10-CM

## 2020-08-24 DIAGNOSIS — Z3482 Encounter for supervision of other normal pregnancy, second trimester: Secondary | ICD-10-CM

## 2020-08-24 DIAGNOSIS — Z3A11 11 weeks gestation of pregnancy: Secondary | ICD-10-CM | POA: Diagnosis not present

## 2020-08-24 MED ORDER — VITAFOL ULTRA 29-0.6-0.4-200 MG PO CAPS: 1.0000 | ORAL_CAPSULE | Freq: Every day | ORAL | 12 refills | Status: DC

## 2020-08-24 NOTE — Progress Notes (Signed)
NOB in office, intake interview completed on 08-17-20. Pt reports occasional cramping, no bleeding.

## 2020-08-24 NOTE — Patient Instructions (Signed)
Abdominal Pain During Pregnancy  Abdominal pain is common during pregnancy, and has many possible causes. Some causes are more serious than others, and sometimes the cause is not known. Abdominal pain can be a sign that labor is starting. It can also be caused by normal growth and stretching of muscles and ligaments during pregnancy. Always tell your health care provider if you have any abdominal pain. Follow these instructions at home:  Do not have sex or put anything in your vagina until your pain goes away completely.  Get plenty of rest until your pain improves.  Drink enough fluid to keep your urine pale yellow.  Take over-the-counter and prescription medicines only as told by your health care provider.  Keep all follow-up visits as told by your health care provider. This is important. Contact a health care provider if:  Your pain continues or gets worse after resting.  You have lower abdominal pain that: ? Comes and goes at regular intervals. ? Spreads to your back. ? Is similar to menstrual cramps.  You have pain or burning when you urinate. Get help right away if:  You have a fever or chills.  You have vaginal bleeding.  You are leaking fluid from your vagina.  You are passing tissue from your vagina.  You have vomiting or diarrhea that lasts for more than 24 hours.  Your baby is moving less than usual.  You feel very weak or faint.  You have shortness of breath.  You develop severe pain in your upper abdomen. Summary  Abdominal pain is common during pregnancy, and has many possible causes.  If you experience abdominal pain during pregnancy, tell your health care provider right away.  Follow your health care provider's home care instructions and keep all follow-up visits as directed. This information is not intended to replace advice given to you by your health care provider. Make sure you discuss any questions you have with your health care  provider. Document Revised: 02/09/2019 Document Reviewed: 01/24/2017 Elsevier Patient Education  2020 [employer]. First Trimester of Pregnancy  The first trimester of pregnancy is from week 1 until the end of week 13 (months 1 through 3). During this time, your baby will begin to develop inside you. At 6-8 weeks, the eyes and face are formed, and the heartbeat can be seen on ultrasound. At the end of 12 weeks, all the baby's organs are formed. Prenatal care is all the medical care you receive before the birth of your baby. Make sure you get good prenatal care and follow all of your doctor's instructions. Follow these instructions at home: Medicines  Take over-the-counter and prescription medicines only as told by your doctor. Some medicines are safe and some medicines are not safe during pregnancy.  Take a prenatal vitamin that contains at least 600 micrograms (mcg) of folic acid.  If you have trouble pooping (constipation), take medicine that will make your stool soft (stool softener) if your doctor approves. Eating and drinking   Eat regular, healthy meals.  Your doctor will tell you the amount of weight gain that is right for you.  Avoid raw meat and uncooked cheese.  If you feel sick to your stomach (nauseous) or throw up (vomit): ? Eat 4 or 5 small meals a day instead of 3 large meals. ? Try eating a few soda crackers. ? Drink liquids between meals instead of during meals.  To prevent constipation: ? Eat foods that are high in fiber, like fresh fruits and  vegetables, whole grains, and beans. ? Drink enough fluids to keep your pee (urine) clear or pale yellow. Activity  Exercise only as told by your doctor. Stop exercising if you have cramps or pain in your lower belly (abdomen) or low back.  Do not exercise if it is too hot, too humid, or if you are in a place of great height (high altitude).  Try to avoid standing for long periods of time. Move your legs often if you  must stand in one place for a long time.  Avoid heavy lifting.  Wear low-heeled shoes. Sit and stand up straight.  You can have sex unless your doctor tells you not to. Relieving pain and discomfort  Wear a good support bra if your breasts are sore.  Take warm water baths (sitz baths) to soothe pain or discomfort caused by hemorrhoids. Use hemorrhoid cream if your doctor says it is okay.  Rest with your legs raised if you have leg cramps or low back pain.  If you have puffy, bulging veins (varicose veins) in your legs: ? Wear support hose or compression stockings as told by your doctor. ? Raise (elevate) your feet for 15 minutes, 3-4 times a day. ? Limit salt in your food. Prenatal care  Schedule your prenatal visits by the twelfth week of pregnancy.  Write down your questions. Take them to your prenatal visits.  Keep all your prenatal visits as told by your doctor. This is important. Safety  Wear your seat belt at all times when driving.  Make a list of emergency phone numbers. The list should include numbers for family, friends, the hospital, and police and fire departments. General instructions  Ask your doctor for a referral to a local prenatal class. Begin classes no later than at the start of month 6 of your pregnancy.  Ask for help if you need counseling or if you need help with nutrition. Your doctor can give you advice or tell you where to go for help.  Do not use hot tubs, steam rooms, or saunas.  Do not douche or use tampons or scented sanitary pads.  Do not cross your legs for long periods of time.  Avoid all herbs and alcohol. Avoid drugs that are not approved by your doctor.  Do not use any tobacco products, including cigarettes, chewing tobacco, and electronic cigarettes. If you need help quitting, ask your doctor. You may get counseling or other support to help you quit.  Avoid cat litter boxes and soil used by cats. These carry germs that can cause  birth defects in the baby and can cause a loss of your baby (miscarriage) or stillbirth.  Visit your dentist. At home, brush your teeth with a soft toothbrush. Be gentle when you floss. Contact a doctor if:  You are dizzy.  You have mild cramps or pressure in your lower belly.  You have a nagging pain in your belly area.  You continue to feel sick to your stomach, you throw up, or you have watery poop (diarrhea).  You have a bad smelling fluid coming from your vagina.  You have pain when you pee (urinate).  You have increased puffiness (swelling) in your face, hands, legs, or ankles. Get help right away if:  You have a fever.  You are leaking fluid from your vagina.  You have spotting or bleeding from your vagina.  You have very bad belly cramping or pain.  You gain or lose weight rapidly.  You throw up  blood. It may look like coffee grounds.  You are around people who have [place] measles, fifth disease, or chickenpox.  You have a very bad headache.  You have shortness of breath.  You have any kind of trauma, such as from a fall or a car accident. Summary  The first trimester of pregnancy is from week 1 until the end of week 13 (months 1 through 3).  To take care of yourself and your unborn baby, you will need to eat healthy meals, take medicines only if your doctor tells you to do so, and do activities that are safe for you and your baby.  Keep all follow-up visits as told by your doctor. This is important as your doctor will have to ensure that your baby is healthy and growing well. This information is not intended to replace advice given to you by your health care provider. Make sure you discuss any questions you have with your health care provider. Document Revised: 02/12/2019 Document Reviewed: 10/30/2016 Elsevier Patient Education  2020 [employer].

## 2020-08-24 NOTE — Progress Notes (Signed)
INITIAL PRENATAL VISIT NOTE  Subjective:  Ebony Snyder is a 24 y.o. G1P0 at 13w2dby sure LMP c/w 6 week u/s being seen today for her initial prenatal visit. This is a unplanned pregnancy.  She was using nothing for birth control previously. She has an obstetric history significant for nothing . She has a medical history significant for migraines and anxiety.  Patient reports consistent cramping, but she has had this sensation since prior to pregnancy 09/2019.  Contractions: Irritability. Vag. Bleeding: None.   . Denies leaking of fluid.    Past Medical History:  Diagnosis Date   Anxiety    Concussion    Depression    Dysmenorrhea    Migraine without aura    STD (sexually transmitted disease)     Past Surgical History:  Procedure Laterality Date   MOUTH SURGERY      OB History  Gravida Para Term Preterm AB Living  1            SAB TAB Ectopic Multiple Live Births               # Outcome Date GA Lbr Len/2nd Weight Sex Delivery Anes PTL Lv  1 Current             Social History   Socioeconomic History   Marital status: Single    Spouse name: Not on file   Number of children: Not on file   Years of education: Not on file   Highest education level: Not on file  Occupational History   Not on file  Tobacco Use   Smoking status: Never Smoker   Smokeless tobacco: Never Used  Vaping Use   Vaping Use: Never used  Substance and Sexual Activity   Alcohol use: Yes    Comment: occ   Drug use: No   Sexual activity: Yes    Comment: condoms 50% of the time  Other Topics Concern   Not on file  Social History Narrative   Not on file   Social Determinants of Health   Financial Resource Strain:    Difficulty of Paying Living Expenses: Not on file  Food Insecurity:    Worried About Running Out of Food in the Last Year: Not on file   RYR[employer] Worldwideof Food in the Last Year: Not on file  Transportation Needs:    Lack of Transportation (Medical): Not  on file   Lack of Transportation (Non-Medical): Not on file  Physical Activity:    Days of Exercise per Week: Not on file   Minutes of Exercise per Session: Not on file  Stress:    Feeling of Stress : Not on file  Social Connections:    Frequency of Communication with Friends and Family: Not on file   Frequency of Social Gatherings with Friends and Family: Not on file   Attends Religious Services: Not on file   Active Member of Clubs or Organizations: Not on file   Attends CArchivistMeetings: Not on file   Marital Status: Not on file    Family History  Problem Relation Age of Onset   Hypertension Mother    Asthma Sister    Diabetes Maternal Grandmother    Early death Maternal Grandmother      Current Outpatient Medications:    acetaminophen (TYLENOL) 325 MG tablet, Take 650 mg by mouth every 6 (six) hours as needed., Disp: , Rfl:    Blood Pressure Monitoring (BLOOD PRESSURE KIT) DEVI, 1  kit by Does not apply route once a week. Check Blood Pressure regularly and record readings into the Babyscripts App.  Large Cuff.  DX O90.0, Disp: 1 each, Rfl: 0   metoCLOPramide (REGLAN) 10 MG tablet, Take 1 tablet (10 mg total) by mouth every 6 (six) hours as needed for nausea. (Patient not taking: Reported on 08/24/2020), Disp: 30 tablet, Rfl: 0   Prenat-Fe Poly-Methfol-FA-DHA (VITAFOL ULTRA) 29-0.6-0.4-200 MG CAPS, Take 1 tablet by mouth daily., Disp: 30 capsule, Rfl: 12  No Known Allergies  Review of Systems: Negative except for what is mentioned in HPI.  Objective:   Vitals:   08/24/20 1421  BP: 133/82  Pulse: (!) 103  Weight: 174 lb 8 oz (79.2 kg)    Fetal Status: Fetal Heart Rate (bpm): 165         Physical Exam: BP 133/82    Pulse (!) 103    Wt 174 lb 8 oz (79.2 kg)    LMP 06/06/2020    BMI 31.92 kg/m  CONSTITUTIONAL: Well-developed, well-nourished female in no acute distress.  NEUROLOGIC: Alert and oriented to person, place, and time. Normal  reflexes, muscle tone coordination. No cranial nerve deficit noted. PSYCHIATRIC: Normal mood and affect. Normal behavior. Normal judgment and thought content. SKIN: Skin is warm and dry. No rash noted. Not diaphoretic. No erythema. No pallor. HENT:  Normocephalic, atraumatic, External right and left ear normal. Oropharynx is clear and moist EYES: Conjunctivae and EOM are normal. Pupils are equal, round, and reactive to light. No scleral icterus.  NECK: Normal range of motion, supple, no masses CARDIOVASCULAR: Normal heart rate noted, regular rhythm RESPIRATORY: Effort and breath sounds normal, no problems with respiration noted BREASTS: deferred ABDOMEN: Soft, nontender, nondistended, gravid. GU: normal appearing external female genitalia, nulliparous normal appearing cervix, scant white discharge in vagina, no lesions noted Bimanual: 11 weeks sized uterus, no adnexal tenderness or palpable lesions noted MUSCULOSKELETAL: Normal range of motion. EXT:  No edema and no tenderness. 2+ distal pulses.   Assessment and Plan:  Pregnancy: G1P0 at 31w2dby sure LMP  1. Supervision of other normal pregnancy, antepartum Pt advised to increase hydration to 6-10 bottles of water per day  Consider cervical length for baseline at time of anatomy scan   - Prenat-Fe Poly-Methfol-FA-DHA (VITAFOL ULTRA) 29-0.6-0.4-200 MG CAPS; Take 1 tablet by mouth daily.  Dispense: 30 capsule; Refill: 12 - Culture, OB Urine - Genetic Screening - CBC/D/Plt+RPR+Rh+ABO+Rub Ab... - [place] OB DETAIL +14 WK; Future  2. [redacted] weeks gestation of pregnancy    Preterm labor symptoms and general obstetric precautions including but not limited to vaginal bleeding, contractions, leaking of fluid and fetal movement were reviewed in detail with the patient.  Please refer to After Visit Summary for other counseling recommendations.   Return in about 4 weeks (around 09/21/2020) for ROB, in person.  LGriffin Basil10/20/2021 3:08 PM

## 2020-08-26 LAB — CBC/D/PLT+RPR+RH+ABO+RUB AB...
Basophils Absolute: 0 10*3/uL (ref 0.0–0.2)
Basos: 0 %
EOS (ABSOLUTE): 0.1 10*3/uL (ref 0.0–0.4)
Eos: 1 %
HCV Ab: <0.1 s/co ratio (ref 0.0–0.9)
HIV Screen 4th Generation wRfx: NONREACTIVE
Hematocrit: 35.8 % (ref 34.0–46.6)
Hemoglobin: 12 g/dL (ref 11.1–15.9)
Hepatitis B Surface Ag: NEGATIVE
Immature Grans (Abs): 0 10*3/uL (ref 0.0–0.1)
Immature Granulocytes: 0 %
Lymphocytes Absolute: 1.8 10*3/uL (ref 0.7–3.1)
Lymphs: 20 %
MCH: 29.1 pg (ref 26.6–33.0)
MCHC: 33.5 g/dL (ref 31.5–35.7)
MCV: 87 fL (ref 79–97)
Monocytes Absolute: 0.8 10*3/uL (ref 0.1–0.9)
Monocytes: 9 %
Neutrophils Absolute: 6.3 10*3/uL (ref 1.4–7.0)
Neutrophils: 70 %
Platelets: 288 10*3/uL (ref 150–450)
RBC: 4.12 x10E6/uL (ref 3.77–5.28)
RDW: 12.3 % (ref 11.7–15.4)
RPR Ser Ql: NONREACTIVE
Rh Factor: POSITIVE
Rubella Antibodies, IGG: 5.08 index (ref >0.99)
WBC: 9 10*3/uL (ref 3.4–10.8)

## 2020-08-26 LAB — AB SCR+ANTIBODY ID: Antibody Screen: POSITIVE — AB

## 2020-08-26 LAB — CULTURE, OB URINE

## 2020-08-26 LAB — HCV INTERPRETATION

## 2020-08-26 LAB — URINE CULTURE, OB REFLEX

## 2020-08-30 ENCOUNTER — Encounter: Payer: Self-pay | Admitting: Obstetrics and Gynecology

## 2020-08-31 ENCOUNTER — Ambulatory Visit: Payer: 59 | Admitting: Obstetrics and Gynecology

## 2020-08-31 ENCOUNTER — Other Ambulatory Visit: Payer: Self-pay | Admitting: *Deleted

## 2020-08-31 MED ORDER — PRENATE PIXIE 10-0.6-0.4-200 MG PO CAPS
1.0000 | ORAL_CAPSULE | Freq: Every day | ORAL | 11 refills | Status: DC
Start: 2020-08-31 — End: 2022-06-19

## 2020-08-31 NOTE — Progress Notes (Signed)
New Rx for PNV sent to pharmacy, other was not covered by ins. prenate pixie sent today, advised pt to call office if it is not covered.  Otherwise, she may take an OTC prenatal

## 2020-09-05 ENCOUNTER — Encounter: Payer: Self-pay | Admitting: Obstetrics and Gynecology

## 2020-09-08 ENCOUNTER — Other Ambulatory Visit: Payer: Self-pay

## 2020-09-08 ENCOUNTER — Inpatient Hospital Stay (HOSPITAL_COMMUNITY)
Admission: AD | Admit: 2020-09-08 | Discharge: 2020-09-08 | Disposition: A | Discharge status: Home / Self Care | Payer: Medicaid Other | Attending: Obstetrics & Gynecology | Admitting: Obstetrics & Gynecology

## 2020-09-09 ENCOUNTER — Other Ambulatory Visit: Payer: Self-pay

## 2020-09-09 ENCOUNTER — Encounter (HOSPITAL_COMMUNITY): Payer: Self-pay | Admitting: Obstetrics and Gynecology

## 2020-09-09 ENCOUNTER — Inpatient Hospital Stay (HOSPITAL_COMMUNITY)
Admission: AD | Admit: 2020-09-09 | Discharge: 2020-09-09 | Disposition: A | Discharge status: Home / Self Care | Payer: 59 | Attending: Obstetrics and Gynecology | Admitting: Obstetrics and Gynecology

## 2020-09-09 DIAGNOSIS — O98911 Unspecified maternal infectious and parasitic disease complicating pregnancy, first trimester: Secondary | ICD-10-CM | POA: Insufficient documentation

## 2020-09-09 DIAGNOSIS — B9689 Other specified bacterial agents as the cause of diseases classified elsewhere: Secondary | ICD-10-CM

## 2020-09-09 DIAGNOSIS — R109 Unspecified abdominal pain: Secondary | ICD-10-CM | POA: Insufficient documentation

## 2020-09-09 DIAGNOSIS — O26893 Other specified pregnancy related conditions, third trimester: Secondary | ICD-10-CM | POA: Diagnosis present

## 2020-09-09 DIAGNOSIS — N760 Acute vaginitis: Secondary | ICD-10-CM | POA: Diagnosis not present

## 2020-09-09 DIAGNOSIS — B379 Candidiasis, unspecified: Secondary | ICD-10-CM

## 2020-09-09 DIAGNOSIS — B373 Candidiasis of vulva and vagina: Secondary | ICD-10-CM | POA: Diagnosis not present

## 2020-09-09 DIAGNOSIS — Z8619 Personal history of other infectious and parasitic diseases: Secondary | ICD-10-CM | POA: Insufficient documentation

## 2020-09-09 DIAGNOSIS — O98811 Other maternal infectious and parasitic diseases complicating pregnancy, first trimester: Secondary | ICD-10-CM

## 2020-09-09 DIAGNOSIS — Z3A13 13 weeks gestation of pregnancy: Secondary | ICD-10-CM | POA: Insufficient documentation

## 2020-09-09 DIAGNOSIS — Z3480 Encounter for supervision of other normal pregnancy, unspecified trimester: Secondary | ICD-10-CM

## 2020-09-09 LAB — URINALYSIS, ROUTINE W REFLEX MICROSCOPIC
Bilirubin Urine: NEGATIVE
Glucose, UA: NEGATIVE mg/dL
Hgb urine dipstick: NEGATIVE
Ketones, ur: NEGATIVE mg/dL
Leukocytes,Ua: NEGATIVE
Nitrite: NEGATIVE
Protein, ur: NEGATIVE mg/dL
Specific Gravity, Urine: 1.005 (ref 1.005–1.030)
pH: 8 (ref 5.0–8.0)

## 2020-09-09 LAB — WET PREP, GENITAL
Sperm: NONE SEEN
Trich, Wet Prep: NONE SEEN

## 2020-09-09 MED ORDER — TERCONAZOLE 0.4 % VA CREA: 1.0000 | TOPICAL_CREAM | Freq: Every day | VAGINAL | 0 refills | Status: DC

## 2020-09-09 MED ORDER — METRONIDAZOLE 500 MG PO TABS: 500.0000 mg | ORAL_TABLET | Freq: Two times a day (BID) | ORAL | 0 refills | Status: AC

## 2020-09-09 NOTE — MAU Note (Signed)
Has had vaginal irritation and random itching, has been urinating every 15-63min.  Denies pain with urination.

## 2020-09-09 NOTE — Discharge Instructions (Signed)

## 2020-09-09 NOTE — MAU Provider Note (Addendum)
History     CSN: [id number]  Arrival date and time: 09/09/20 3343   None     Chief Complaint  Patient presents with   Vaginal Itching   vaginal irritation   Vaginal Discharge   HPI 24 year old G37P0 female at 13 weeks presents for vaginal itching. She says that the itching started about a week ago and has been accompanied with white thin discharge that has a fishy odor. She has had BV in the past and says it feels similar. She has also been experiencing urinary frequency every 15 minutes. She denies burning or dysuria. She denies vaginal pain or bleeding.   She has constant abdominal cramping that started last November and has seen multiple providers to diagnose the condition. She reports adequate fluid intake and denies increase in stress.    Past Medical History:  Diagnosis Date   Anxiety    Concussion    Depression    Dysmenorrhea    Migraine without aura    STD (sexually transmitted disease)     Past Surgical History:  Procedure Laterality Date   MOUTH SURGERY      Family History  Problem Relation Age of Onset   Hypertension Mother    Asthma Sister    Diabetes Maternal Grandmother    Early death Maternal Grandmother     Social History   Tobacco Use   Smoking status: Never Smoker   Smokeless tobacco: Never Used  Vaping Use   Vaping Use: Never used  Substance Use Topics   Alcohol use: Yes    Comment: occ   Drug use: No    Allergies: No Known Allergies  Medications Prior to Admission  Medication Sig Dispense Refill Last Dose   acetaminophen (TYLENOL) 325 MG tablet Take 650 mg by mouth every 6 (six) hours as needed.      Blood Pressure Monitoring (BLOOD PRESSURE KIT) DEVI 1 kit by Does not apply route once a week. Check Blood Pressure regularly and record readings into the Babyscripts App.  Large Cuff.  DX O90.0 1 each 0    metoCLOPramide (REGLAN) 10 MG tablet Take 1 tablet (10 mg total) by mouth every 6 (six) hours as needed for  nausea. (Patient not taking: Reported on 08/24/2020) 30 tablet 0    Prenat-FeAsp-Meth-FA-DHA w/o A (PRENATE PIXIE) 10-0.6-0.4-200 MG CAPS Take 1 capsule by mouth daily. 30 capsule 11     Review of Systems  Constitutional: Negative for chills and fever.  Gastrointestinal: Negative for abdominal pain.  Genitourinary: Positive for frequency and vaginal discharge. Negative for difficulty urinating, dysuria, flank pain, vaginal bleeding and vaginal pain.   Physical Exam   Blood pressure 132/80, pulse 74, temperature 99.1 F (37.3 C), temperature source Oral, resp. rate 16, height 5' 2"  (1.575 m), weight 80.7 kg, last menstrual period 06/06/2020, SpO2 100 %.  Physical Exam Constitutional:      Appearance: Normal appearance.  Cardiovascular:     Rate and Rhythm: Normal rate and regular rhythm.     Pulses: Normal pulses.     Heart sounds: Normal heart sounds.  Pulmonary:     Effort: Pulmonary effort is normal.     Breath sounds: Normal breath sounds.  Abdominal:     General: Abdomen is flat.     Palpations: Abdomen is soft.     Tenderness: There is no abdominal tenderness.  Neurological:     Mental Status: She is alert.     MAU Course  Procedures  MDM -Vaginal itching  and discharge   -clue cells on wet prep  -yeast on wet prep -Past history of STDs  -G/C testing  -Urinary frequency   -U/A wnl  Assessment and Plan  24 year old G1P0 female at 13 weeks presents for vaginal itching due to yeast infection and bacterial vaginosis.   -Bacterial vaginosis   -Administer Metronidazole  -Candida Vulvovaginitis   -start terconazole treatment -Persistent abdominal cramping   -Consoled patient on continuing to avoid  NSAIDs  -discussed manifestations of somatic  symptoms     Benson Setting 09/09/2020, 9:11 AM    PA student attestation   I have seen and examined this patient and agree with above documentation in the PA's note.   Ebony Snyder is a 24 y.o. G1P0 female  reporting vaginal itching, vaginal discharge, and irritation. She does have a Hx of BV. She is [redacted] weeks gestation. She reports the discharge as thin and fishy. She reports urine frequency however no dysuria.   Associated symptoms: as stated in HPI   PE: No data found. Gen: calm comfortable, NAD Resp: normal effort, no distress Heart: Regular rate Abd: Soft, NT, Pos BS x 4 Neuro: A&O x 4 Pelvic exam: Vaginal swabs collected without speculum.   ROS, labs, PMH reviewed  Orders Placed This Encounter  Procedures   Wet prep, genital   Urinalysis, Routine w reflex microscopic Urine, Clean Catch   Discharge patient   Meds ordered this encounter  Medications   metroNIDAZOLE (FLAGYL) 500 MG tablet    Sig: Take 1 tablet (500 mg total) by mouth 2 (two) times daily for 7 days.    Dispense:  14 tablet    Refill:  0    Order Specific Question:   Supervising Provider    Answer:   CONSTANT, PEGGY [[id number]]   terconazole (TERAZOL 7) 0.4 % vaginal cream    Sig: Place 1 applicator vaginally at bedtime.    Dispense:  45 g    Refill:  0    Order Specific Question:   Supervising Provider    Answer:   CONSTANT, PEGGY [[id number]]    MDM   Assessment 1. Supervision of other normal pregnancy, antepartum   2. Yeast infection   3. Bacterial vaginosis   4. [redacted] weeks gestation of pregnancy     Plan: - Discharge home in stable condition - Rx Flagyl and Terazol.  - Follow-up as scheduled at your doctor's office or sooner as needed if symptoms worsen. - Return to maternity admissions if symptoms worsen  Dannah Ryles, Artist Pais, NP 09/14/2020 2:50 PM

## 2020-09-12 LAB — GC/CHLAMYDIA PROBE AMP (~~LOC~~) NOT AT ARMC
Chlamydia: NEGATIVE
Comment: NEGATIVE
Comment: NORMAL
Neisseria Gonorrhea: NEGATIVE

## 2020-09-21 ENCOUNTER — Ambulatory Visit (INDEPENDENT_AMBULATORY_CARE_PROVIDER_SITE_OTHER): Payer: Medicaid Other | Admitting: Certified Nurse Midwife

## 2020-09-21 ENCOUNTER — Encounter: Payer: Self-pay | Admitting: Certified Nurse Midwife

## 2020-09-21 ENCOUNTER — Other Ambulatory Visit: Payer: Self-pay

## 2020-09-21 VITALS — BP 120/79 | HR 90 | Wt 182.0 lb

## 2020-09-21 DIAGNOSIS — Z3A15 15 weeks gestation of pregnancy: Secondary | ICD-10-CM

## 2020-09-21 DIAGNOSIS — O26899 Other specified pregnancy related conditions, unspecified trimester: Secondary | ICD-10-CM | POA: Insufficient documentation

## 2020-09-21 DIAGNOSIS — Z3480 Encounter for supervision of other normal pregnancy, unspecified trimester: Secondary | ICD-10-CM

## 2020-09-21 DIAGNOSIS — M549 Dorsalgia, unspecified: Secondary | ICD-10-CM

## 2020-09-21 DIAGNOSIS — O26892 Other specified pregnancy related conditions, second trimester: Secondary | ICD-10-CM

## 2020-09-21 DIAGNOSIS — R519 Headache, unspecified: Secondary | ICD-10-CM

## 2020-09-21 MED ORDER — CYCLOBENZAPRINE HCL 10 MG PO TABS: 10.0000 mg | ORAL_TABLET | Freq: Three times a day (TID) | ORAL | 1 refills | Status: DC | PRN

## 2020-09-21 NOTE — Progress Notes (Signed)
   PRENATAL VISIT NOTE  Subjective:  Ebony Snyder is a 24 y.o. G1P0 at [redacted]w[redacted]d being seen today for ongoing prenatal care.  She is currently monitored for the following issues for this low-risk pregnancy and has Supervision of other normal pregnancy, antepartum; [redacted] weeks gestation of pregnancy; and Headache in pregnancy, antepartum on their problem list.  Patient reports back pain and headaches.  Contractions: Not present. Vag. Bleeding: None.   . Denies leaking of fluid.   The following portions of the patient's history were reviewed and updated as appropriate: allergies, current medications, past family history, past medical history, past social history, past surgical history and problem list.   Objective:   Vitals:   09/21/20 0902  BP: 120/79  Pulse: 90  Weight: 182 lb (82.6 kg)    Fetal Status: Fetal Heart Rate (bpm): 150         General:  Alert, oriented and cooperative. Patient is in no acute distress.  Skin: Skin is warm and dry. No rash noted.   Cardiovascular: Normal heart rate noted  Respiratory: Normal respiratory effort, no problems with respiration noted  Abdomen: Soft, gravid, appropriate for gestational age.  Pain/Pressure: Present     Pelvic: Cervical exam deferred        Extremities: Normal range of motion.     Mental Status: Normal mood and affect. Normal behavior. Normal judgment and thought content.   Assessment and Plan:  Pregnancy: G1P0 at [redacted]w[redacted]d 1. Supervision of other normal pregnancy, antepartum - Routine prenatal care - Anticipatory guidance on upcoming appointments with next appointment being virtual  - discussed with patient location of anatomy US and address on AVS   2. [redacted] weeks gestation of pregnancy - AFP, Serum, Open Spina Bifida  3. Headache in pregnancy, antepartum - Patient has a hx of migraines prior to pregnancy  - Patient has been taking tylenol without relief - Educated and discussed options of reglan vs flexeril vs fioricet for HA    - Patient opted into flexeril at this time, encouraged patient to call office if flexeril does not relief HAs, encouraged to increase water consumption and can continue to take Tylenol as needed - cyclobenzaprine (FLEXERIL) 10 MG tablet; Take 1 tablet (10 mg total) by mouth every 8 (eight) hours as needed for muscle spasms.  Dispense: 20 tablet; Refill: 1  4. Back pain during pregnancy in second trimester - Discussed round ligament massages and stretches during pregnancy   Preterm labor symptoms and general obstetric precautions including but not limited to vaginal bleeding, contractions, leaking of fluid and fetal movement were reviewed in detail with the patient. Please refer to After Visit Summary for other counseling recommendations.   Return in about 4 weeks (around 10/19/2020) for LROB, virtual.  Future Appointments  Date Time Provider Department Center  10/17/2020 10:15 AM [facility]  10/19/2020  8:35 AM Burleson, Brand Males, NP [facility] None    Sharyon Cable, CNM

## 2020-09-21 NOTE — Progress Notes (Signed)
°  Pt is having increase in HA's, little to no relief with Tylenol.  Having cramping in lower left back.   Pt was on Flagyl but could not complete, made her sick. States no symptoms of BV today. Pt made aware she may pick up BP cuff at [facility]

## 2020-09-21 NOTE — Patient Instructions (Addendum)
Round Ligament Massage & Stretches  Massage: Starting at the middle of your pubic bone, trace little circles in a wide U from your pubic bone to your hip bones on both sides.  Then starting just above your pubic bone, press in and down, alternating sides to create a gentle rocking of your uterus back and forth.  Move your hands up the sides of your belly and back down. Do this 3-5 times upon waking and before bed.  Stretches: Get on hands and knees and alternate arching your back deeply while inhaling, and then rounding your back while exhaling. Modified runners lunge:  - Sit on a chair with half of your bottom on the chair and half off.  - Sit up tall, plant your front foot, and stretch your other foot out behind you.  - Breathe deeply for 5 breaths and then do the other side.   Second Trimester of Pregnancy  The second trimester is from week 14 through week 27 (month 4 through 6). This is often the time in pregnancy that you feel your best. Often times, morning sickness has lessened or quit. You may have more energy, and you may get hungry more often. Your unborn baby is growing rapidly. At the end of the sixth month, he or she is about 9 inches long and weighs about 1 pounds. You will likely feel the baby move between 18 and 20 weeks of pregnancy. Follow these instructions at home: Medicines  Take over-the-counter and prescription medicines only as told by your doctor. Some medicines are safe and some medicines are not safe during pregnancy.  Take a prenatal vitamin that contains at least 600 micrograms (mcg) of folic acid.  If you have trouble pooping (constipation), take medicine that will make your stool soft (stool softener) if your doctor approves. Eating and drinking   Eat regular, healthy meals.  Avoid raw meat and uncooked cheese.  If you get low calcium from the food you eat, talk to your doctor about taking a daily calcium supplement.  Avoid foods that are high in  fat and sugars, such as fried and sweet foods.  If you feel sick to your stomach (nauseous) or throw up (vomit): ? Eat 4 or 5 small meals a day instead of 3 large meals. ? Try eating a few soda crackers. ? Drink liquids between meals instead of during meals.  To prevent constipation: ? Eat foods that are high in fiber, like fresh fruits and vegetables, whole grains, and beans. ? Drink enough fluids to keep your pee (urine) clear or pale yellow. Activity  Exercise only as told by your doctor. Stop exercising if you start to have cramps.  Do not exercise if it is too hot, too humid, or if you are in a place of great height (high altitude).  Avoid heavy lifting.  Wear low-heeled shoes. Sit and stand up straight.  You can continue to have sex unless your doctor tells you not to. Relieving pain and discomfort  Wear a good support bra if your breasts are tender.  Take warm water baths (sitz baths) to soothe pain or discomfort caused by hemorrhoids. Use hemorrhoid cream if your doctor approves.  Rest with your legs raised if you have leg cramps or low back pain.  If you develop puffy, bulging veins (varicose veins) in your legs: ? Wear support hose or compression stockings as told by your doctor. ? Raise (elevate) your feet for 15 minutes, 3-4 times a day. ? Limit salt  in your food. Prenatal care  Write down your questions. Take them to your prenatal visits.  Keep all your prenatal visits as told by your doctor. This is important. Safety  Wear your seat belt when driving.  Make a list of emergency phone numbers, including numbers for family, friends, the hospital, and police and fire departments. General instructions  Ask your doctor about the right foods to eat or for help finding a counselor, if you need these services.  Ask your doctor about local prenatal classes. Begin classes before month 6 of your pregnancy.  Do not use hot tubs, steam rooms, or saunas.  Do not  douche or use tampons or scented sanitary pads.  Do not cross your legs for long periods of time.  Visit your dentist if you have not done so. Use a soft toothbrush to brush your teeth. Floss gently.  Avoid all smoking, herbs, and alcohol. Avoid drugs that are not approved by your doctor.  Do not use any products that contain nicotine or tobacco, such as cigarettes and e-cigarettes. If you need help quitting, ask your doctor.  Avoid cat litter boxes and soil used by cats. These carry germs that can cause birth defects in the baby and can cause a loss of your baby (miscarriage) or stillbirth. Contact a doctor if:  You have mild cramps or pressure in your lower belly.  You have pain when you pee (urinate).  You have bad smelling fluid coming from your vagina.  You continue to feel sick to your stomach (nauseous), throw up (vomit), or have watery poop (diarrhea).  You have a nagging pain in your belly area.  You feel dizzy. Get help right away if:  You have a fever.  You are leaking fluid from your vagina.  You have spotting or bleeding from your vagina.  You have severe belly cramping or pain.  You lose or gain weight rapidly.  You have trouble catching your breath and have chest pain.  You notice sudden or extreme puffiness (swelling) of your face, hands, ankles, feet, or legs.  You have not felt the baby move in over an hour.  You have severe headaches that do not go away when you take medicine.  You have trouble seeing. Summary  The second trimester is from week 14 through week 27 (months 4 through 6). This is often the time in pregnancy that you feel your best.  To take care of yourself and your unborn baby, you will need to eat healthy meals, take medicines only if your doctor tells you to do so, and do activities that are safe for you and your baby.  Call your doctor if you get sick or if you notice anything unusual about your pregnancy. Also, call your doctor  if you need help with the right food to eat, or if you want to know what activities are safe for you. This information is not intended to replace advice given to you by your health care provider. Make sure you discuss any questions you have with your health care provider. Document Revised: 02/13/2019 Document Reviewed: 11/27/2016 Elsevier Patient Education  2020 [employer].

## 2020-09-23 LAB — AFP, SERUM, OPEN SPINA BIFIDA
AFP MoM: 1.39
AFP Value: 40.9 ng/mL
Gest. Age on Collection Date: 15.2 weeks
Maternal Age At EDD: 26 yr
OSBR Risk 1 IN: 7407
Test Results:: NEGATIVE
Weight: 182 [lb_av]

## 2020-10-17 ENCOUNTER — Other Ambulatory Visit: Payer: Self-pay

## 2020-10-17 ENCOUNTER — Other Ambulatory Visit: Payer: Self-pay | Admitting: *Deleted

## 2020-10-17 ENCOUNTER — Ambulatory Visit: Payer: 59 | Attending: Obstetrics and Gynecology

## 2020-10-17 DIAGNOSIS — Z362 Encounter for other antenatal screening follow-up: Secondary | ICD-10-CM

## 2020-10-17 DIAGNOSIS — Z3480 Encounter for supervision of other normal pregnancy, unspecified trimester: Secondary | ICD-10-CM | POA: Insufficient documentation

## 2020-10-19 ENCOUNTER — Telehealth (INDEPENDENT_AMBULATORY_CARE_PROVIDER_SITE_OTHER): Payer: 59 | Admitting: Nurse Practitioner

## 2020-10-19 ENCOUNTER — Telehealth: Payer: Medicaid Other | Admitting: Obstetrics

## 2020-10-19 ENCOUNTER — Encounter: Payer: Self-pay | Admitting: Nurse Practitioner

## 2020-10-19 DIAGNOSIS — Z3A19 19 weeks gestation of pregnancy: Secondary | ICD-10-CM

## 2020-10-19 DIAGNOSIS — O26892 Other specified pregnancy related conditions, second trimester: Secondary | ICD-10-CM

## 2020-10-19 DIAGNOSIS — Z3480 Encounter for supervision of other normal pregnancy, unspecified trimester: Secondary | ICD-10-CM

## 2020-10-19 DIAGNOSIS — O26899 Other specified pregnancy related conditions, unspecified trimester: Secondary | ICD-10-CM

## 2020-10-19 DIAGNOSIS — R102 Pelvic and perineal pain: Secondary | ICD-10-CM

## 2020-10-19 NOTE — Progress Notes (Signed)
Pt presents for Virtual ROB.  CC: None

## 2020-10-19 NOTE — Progress Notes (Signed)
   TELEHEALTH OBSTETRICS VISIT ENCOUNTER NOTE Attempted MyChart connection several times - could see that she was on but there was no video and no audio and each attempt gave the same result.  Switched to a phone call.  I connected with Ebony Snyder on 10/19/20 at  8:35 AM EST by telephone at home and verified that I am speaking with the correct person using two identifiers.   I discussed the limitations, risks, security and privacy concerns of performing an evaluation and management service by telephone and the availability of in person appointments. I also discussed with the patient that there may be a patient responsible charge related to this service. The patient expressed understanding and agreed to proceed.  Subjective:  Ebony Snyder is a 24 y.o. G1P0 at [redacted]w[redacted]d being followed for ongoing prenatal care.  She is currently monitored for the following issues for this low-risk pregnancy and has Supervision of other normal pregnancy, antepartum; [redacted] weeks gestation of pregnancy; and Headache in pregnancy, antepartum on their problem list.  Patient reports pelvic pain. Reports fetal movement. Denies any contractions, bleeding or leaking of fluid.   The following portions of the patient's history were reviewed and updated as appropriate: allergies, current medications, past family history, past medical history, past social history, past surgical history and problem list.   Objective:   General:  Alert, oriented and cooperative.   Mental Status: Normal mood and affect perceived. Normal judgment and thought content.  Rest of physical exam deferred due to type of encounter  Assessment and Plan:  Pregnancy: G1P0 at [redacted]w[redacted]d 1. Supervision of other normal pregnancy, antepartum Is at work today at the time of the visit and her BP cuff is at home.  Reviewed taking the BP later today and sending it in a MyChart message to the office.  Is not recording BPs into Babyscripts. Headaches in pregnancy  are improving  2. Pelvic pain affecting pregnancy, antepartum Continues to have pelvic pain and cramping.  This is pain she has had prior to pregnancy and it is continuing daily almost constantly.  Ultrasound does not show a reason for pain, so will try PT to see if with this intervention, pain decreases.  - AMB referral to rehabilitation   Preterm labor symptoms and general obstetric precautions including but not limited to vaginal bleeding, contractions, leaking of fluid and fetal movement were reviewed in detail with the patient.  I discussed the assessment and treatment plan with the patient. The patient was provided an opportunity to ask questions and all were answered. The patient agreed with the plan and demonstrated an understanding of the instructions. The patient was advised to call back or seek an in-person office evaluation/go to MAU at [facility] for any urgent or concerning symptoms. Please refer to After Visit Summary for other counseling recommendations.   I provided 9 minutes of non-face-to-face time during this encounter.  Return in about 4 weeks (around 11/16/2020) for in person ROB.  Future Appointments  Date Time Provider Department Center  11/14/2020  9:30 AM [facility] NURSE [facility]  11/14/2020  9:45 AM [facility] US5 [facility]    Currie Paris, NP Center for [employer], [facility]

## 2020-10-20 ENCOUNTER — Telehealth: Payer: Self-pay

## 2020-10-20 NOTE — Telephone Encounter (Signed)
Patient is calling in regards to questions about [place] results.

## 2020-10-20 NOTE — Telephone Encounter (Signed)
Patient was in for ultrasound on 06/30/20 and want to find out if she was pregnant at the time of ultrasound.

## 2020-10-20 NOTE — Telephone Encounter (Signed)
Spoke with pt. Pt calling to inquire about PUS that was performed on 06/30/20 in our office. Pt wanting to know if it showed pregnancy at that time due to determining paternity.  Pt advised per PUS at MAU on 07/16/20, pt was then 6 wks. Advised at her office visit here for PUS on 06/30/20, pt would have only been [redacted] weeks pregnant and unable to capture that on this PUS due to early pregnancy. Pt agreeable and verbalized understanding. Thankful for call back and advice.  Encounter closed

## 2020-10-20 NOTE — Telephone Encounter (Signed)
Patient returned call. Notified that she needs to contact Center for [facility] for results. Patient verbalized understanding.   Routing to provider and will close encounter.

## 2020-10-20 NOTE — Telephone Encounter (Signed)
Attempted to reach patient, voicemail full. Unable to leave a message at this time.   Patient is currently pregnant. Needs to contact Center for [facility] for ultrasound results.

## 2020-11-14 ENCOUNTER — Ambulatory Visit: Payer: 59

## 2020-11-16 ENCOUNTER — Ambulatory Visit: Payer: 59 | Admitting: *Deleted

## 2020-11-16 ENCOUNTER — Encounter: Payer: Self-pay | Admitting: Genetic Counselor

## 2020-11-16 ENCOUNTER — Other Ambulatory Visit: Payer: Self-pay

## 2020-11-16 ENCOUNTER — Ambulatory Visit: Payer: 59 | Attending: Obstetrics

## 2020-11-16 ENCOUNTER — Encounter: Payer: Self-pay | Admitting: *Deleted

## 2020-11-16 ENCOUNTER — Ambulatory Visit (INDEPENDENT_AMBULATORY_CARE_PROVIDER_SITE_OTHER): Payer: 59 | Admitting: Women's Health

## 2020-11-16 ENCOUNTER — Ambulatory Visit (HOSPITAL_BASED_OUTPATIENT_CLINIC_OR_DEPARTMENT_OTHER): Payer: 59 | Admitting: Genetic Counselor

## 2020-11-16 VITALS — BP 131/78 | HR 90 | Wt 183.0 lb

## 2020-11-16 DIAGNOSIS — Z31430 Encounter of female for testing for genetic disease carrier status for procreative management: Secondary | ICD-10-CM

## 2020-11-16 DIAGNOSIS — Z3A23 23 weeks gestation of pregnancy: Secondary | ICD-10-CM | POA: Diagnosis not present

## 2020-11-16 DIAGNOSIS — O99212 Obesity complicating pregnancy, second trimester: Secondary | ICD-10-CM | POA: Diagnosis not present

## 2020-11-16 DIAGNOSIS — Z3480 Encounter for supervision of other normal pregnancy, unspecified trimester: Secondary | ICD-10-CM | POA: Insufficient documentation

## 2020-11-16 DIAGNOSIS — R102 Pelvic and perineal pain: Secondary | ICD-10-CM

## 2020-11-16 DIAGNOSIS — R760 Raised antibody titer: Secondary | ICD-10-CM

## 2020-11-16 DIAGNOSIS — O26899 Other specified pregnancy related conditions, unspecified trimester: Secondary | ICD-10-CM | POA: Insufficient documentation

## 2020-11-16 DIAGNOSIS — Z362 Encounter for other antenatal screening follow-up: Secondary | ICD-10-CM | POA: Diagnosis not present

## 2020-11-16 DIAGNOSIS — Z148 Genetic carrier of other disease: Secondary | ICD-10-CM

## 2020-11-16 DIAGNOSIS — Z315 Encounter for genetic counseling: Secondary | ICD-10-CM | POA: Insufficient documentation

## 2020-11-16 DIAGNOSIS — N949 Unspecified condition associated with female genital organs and menstrual cycle: Secondary | ICD-10-CM

## 2020-11-16 NOTE — Progress Notes (Signed)
Subjective:  Ebony Snyder is a 25 y.o. G1P0 at [redacted]w[redacted]d being seen today for ongoing prenatal care.  She is currently monitored for the following issues for this low-risk pregnancy and has Supervision of other normal pregnancy, antepartum; Headache in pregnancy, antepartum; Abnormal antibody titer; Pelvic pressure in pregnancy; and Genetic carrier on their problem list.  Patient reports intermittent feeling of "a lightening bolt in the vagina" lasting about 3-4 minutes at a time, on-going for several weeks. Patient denies feeling the need to push something out..  Contractions: Not present. Vag. Bleeding: None.  Movement: Present. Denies leaking of fluid.   The following portions of the patient's history were reviewed and updated as appropriate: allergies, current medications, past family history, past medical history, past social history, past surgical history and problem list. Problem list updated.  Objective:   Vitals:   11/16/20 0846  BP: 131/78  Pulse: 90  Weight: 183 lb (83 kg)    Fetal Status: Fetal Heart Rate (bpm): 145 Fundal Height: 24 cm Movement: Present     General:  Alert, oriented and cooperative. Patient is in no acute distress.  Skin: Skin is warm and dry. No rash noted.   Cardiovascular: Normal heart rate noted  Respiratory: Normal respiratory effort, no problems with respiration noted  Abdomen: Soft, gravid, appropriate for gestational age. Pain/Pressure: Present     Pelvic: Vag. Bleeding: None     Cervical exam deferred        Extremities: Normal range of motion.  Edema: None  Mental Status: Normal mood and affect. Normal behavior. Normal judgment and thought content.   Urinalysis:      Assessment and Plan:  Pregnancy: G1P0 at [redacted]w[redacted]d  1. Supervision of other normal pregnancy, antepartum -discussed contraception, pt undecided, info given -peds list given -CBE info given -GTT next visit -f/u US today for anatomy  2. [redacted] weeks gestation of pregnancy  3. Pelvic  pressure in pregnancy -CL at 19 weeks 3.37cm -f/u US today -pt reports has not been to PT as has not been called for appt  4. 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 It has not been reported as a cause of HDN. Titer not indicated.   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 Round ligament pain   Preterm labor symptoms and general obstetric precautions including but not limited to vaginal bleeding, contractions, leaking of fluid and fetal movement were reviewed in detail with the patient. I discussed the assessment and treatment plan with the patient. The patient was provided an opportunity to ask questions and all were answered. The patient agreed with the plan and demonstrated an understanding of the instructions. The patient was advised to call back or seek an in-person office evaluation/go to MAU at [facility] for any urgent or concerning symptoms. Please refer to After Visit Summary for other counseling recommendations.  Return in about 4 weeks (around 12/14/2020) for in-pereson LOB/APP OK/GTT/labs, needs appt with pelvic PT and genetic counseling.   Baldwin Racicot, Odie Sera, NP

## 2020-11-16 NOTE — Patient Instructions (Addendum)
Maternity Assessment Unit (MAU)  The Maternity Assessment Unit (MAU) is located at the [facility]. The address is: [address] [place], [place], [place] [address]. Please see map below for additional directions.    The Maternity Assessment Unit is designed to help you during your pregnancy, and for up to 6 weeks after delivery, with any pregnancy- or postpartum-related emergencies, if you think you are in labor, or if your water has broken. For example, if you experience nausea and vomiting, vaginal bleeding, severe abdominal or pelvic pain, elevated blood pressure or other problems related to your pregnancy or postpartum time, please come to the Maternity Assessment Unit for assistance.       AREA PEDIATRIC/FAMILY PRACTICE PHYSICIANS  [facility] [address] [place], [place] [address] Phone - [phone]   Fax - [place] [address] [place], [place]  [address] Phone - [phone]   Fax - [phone]  [place] [address] [place] [address] [place], [place]  [address] Phone - [phone]   Fax - [phone]  [facility] [address] [place], [place]  [address] Phone - [phone]   Fax - [phone]  [place] [address], [place], [place]  [address] Phone - [phone]   Fax - [place] [address] [place], [place]  [address] Phone - [phone]   Fax - [place] [facility] [place] [address], [place], [place]  [address] Phone - [phone]   Fax - [phone]  [place] [address] [place], [place], [place]  [address] Phone - [phone]   Fax - [place] [address] [place], [place]  [address] Phone - [phone]   Fax -  [phone] [facility] [place]. [address] [place], [place]  [address] Phone - [phone]   Fax - [phone]  [place] [address] [place], [place]  [address] Phone - [phone]   Fax - [phone]  [facility] [place], [place], [place]  [address] Phone - [phone]   Fax - [place] [address], [place], [place]  [address] Phone - [phone]   Fax - [phone]  [facility] [address], [place], [place]  [address] Phone - [place], [place]  [address] Phone - [phone]   Fax - [place] [address], [place], [place]  [address] Phone - [phone]   Fax - [phone]  [place] [address] [place], [place]  [address] Phone - [phone]   Fax - [place]. [place], [place]  [address] Phone - [phone]   Fax - [place], [place], [place]  [address] Phone - [phone]   Fax - [place] [address], [address] [place], [place]  [address] Phone - [phone]   Fax - [phone]  [place] RUBIN [address] [place], [place]  [address] Phone - [phone]   Fax - [place] [address] [place],   [address] Phone - [phone]  Fax - [phone]  [facility] [address] [place], [place]  [address] Phone - [phone]   Fax - [phone] Gerarda Fraction [phone] [place], [place]  [address] Phone - [phone]   Fax - [phone]  [facility] [address] [place], [place]  [address] Phone - [phone]   Fax - [phone]  [facility] - [place] [address] [place], [place]  [address] Phone - [phone]   Fax - [phone]         Childbirth Education Options: [facility] Classes:  Childbirth education classes can help you get ready for a positive parenting experience. You can also meet other expectant parents and get free stuff for your baby. Each class runs for five weeks on the same night and costs $45 for the mother-to-be and her support person. Medicaid covers the cost if you are eligible. Call [phone] to register. [facility] Childbirth Education:  [phone] or [phone] or [email or web address]  Baby & Me Class: Discuss newborn & infant parenting and family adjustment issues with other new mothers in a relaxed environment. Each week brings a new speaker or baby-centered activity. We encourage new mothers to join [place] every Thursday at 11:00am. Babies birth until crawling. No registration or fee. Daddy [employer]: This course offers Dads-to-be the tools and knowledge needed to feel confident on their journey to becoming new fathers. Experienced dads, who have been trained as coaches, teach dads-to-be how to hold, comfort, diaper, swaddle and play with their infant while being able to support the new mom as well. A class for men taught by men. $25/dad Big Brother/Big Sister: Let your children share in the joy of a new brother or sister in this special class designed just for them. Class includes discussion about how families care for babies: swaddling, holding, diapering, safety as well as how they can be helpful in their new role. This class is designed for children ages 2 to 83, but any age is welcome. Please register each child individually. $5/child  Mom Talk: This mom-led group offers support and connection to mothers as they journey through the adjustments and struggles of that sometimes overwhelming first year after the birth of a  child. Tuesdays at 10:00am and Thursdays at 6:00pm. Babies welcome. No registration or fee. Breastfeeding Support Group: This group is a mother-to-mother support circle where moms have the opportunity to share their breastfeeding experiences. A Lactation Consultant is present for questions and concerns. Meets each Tuesday at 11:00am. No fee or registration. Breastfeeding Your Baby: Learn what to expect in the first days of breastfeeding your newborn.  This class will help you feel more confident with the skills needed to begin your breastfeeding experience. Many new mothers are concerned about breastfeeding after leaving the hospital. This class will also address the most common fears and challenges about breastfeeding during the first few weeks, months and beyond. (call for fee) Comfort Techniques and Tour: This 2 hour interactive class will provide you the opportunity to learn & practice hands-on techniques that can help relieve some of the discomfort of labor and encourage your baby to rotate toward the best position for birth. You and your partner will be able to try a variety of labor positions with birth balls and rebozos as well as practice breathing, relaxation, and visualization techniques. A tour of the [facility] is included with this class. $20 per registrant and support person Childbirth  Class- Weekend Option: This class is a Weekend version of our Birth & Baby series. It is designed for parents who have a difficult time fitting several weeks of classes into their schedule. It covers the care of your newborn and the basics of labor and childbirth. It also includes a Maternity Care Center Tour of [facility] and lunch. The class is held two consecutive days: beginning on Friday evening from 6:30 - 8:30 p.m. and the next day, Saturday from 9 a.m. - 4 p.m. (call for fee) Linden Dolin Class: Interested in a waterbirth?  This informational class will help you discover  whether waterbirth is the right fit for you. Education about waterbirth itself, supplies you would need and how to assemble your support team is what you can expect from this class. Some obstetrical practices require this class in order to pursue a waterbirth. (Not all obstetrical practices offer waterbirth-check with your healthcare provider.) Register only the expectant mom, but you are encouraged to bring your partner to class! Required if planning waterbirth, no fee. Infant/Child CPR: Parents, grandparents, babysitters, and friends learn Cardio-Pulmonary Resuscitation skills for infants and children. You will also learn how to treat both conscious and unconscious choking in infants and children. This Family & Friends program does not offer certification. Register each participant individually to ensure that enough mannequins are available. (Call for fee) Grandparent Love: Expecting a grandbaby? This class is for you! Learn about the latest infant care and safety recommendations and ways to support your own child as he or she transitions into the parenting role. Taught by Registered Nurses who are childbirth instructors, but most importantly...they are grandmothers too! $10/person. Childbirth Class- Natural Childbirth: This series of 5 weekly classes is for expectant parents who want to learn and practice natural methods of coping with the process of labor and childbirth. Relaxation, breathing, massage, visualization, role of the partner, and helpful positioning are highlighted. Participants learn how to be confident in their body's ability to give birth. This class will empower and help parents make informed decisions about their own care. Includes discussion that will help new parents transition into the immediate postpartum period. Maternity Care Center Tour of [facility] is included. We suggest taking this class between 25-32 weeks, but it's only a recommendation. $75 per registrant and one support  person or $30 Medicaid. Childbirth Class- 3 week Series: This option of 3 weekly classes helps you and your labor partner prepare for childbirth. Newborn care, labor & birth, cesarean birth, pain management, and comfort techniques are discussed and a Maternity Care Center Tour of [facility] is included. The class meets at the same time, on the same day of the week for 3 consecutive weeks beginning with the starting date you choose. $60 for registrant and one support person.  Marvelous Multiples: Expecting twins, triplets, or more? This class covers the differences in labor, birth, parenting, and breastfeeding issues that face multiples' parents. NICU tour is included. Led by a Certified Childbirth Educator who is the mother of twins. No fee. Caring for Baby: This class is for expectant and adoptive parents who want to learn and practice the most up-to-date newborn care for their babies. Focus is on birth through the first six weeks of life. Topics include feeding, bathing, diapering, crying, umbilical cord care, circumcision care and safe sleep. Parents learn to recognize symptoms of illness and when to call the pediatrician. Register only the mom-to-be and your partner or support person can plan to come with you! $10 per  registrant and support person Childbirth Class- online option: This online class offers you the freedom to complete a Birth and Baby series in the comfort of your own home. The flexibility of this option allows you to review sections at your own pace, at times convenient to you and your support people. It includes additional video information, animations, quizzes, and extended activities. Get organized with helpful eClass tools, checklists, and trackers. Once you register online for the class, you will receive an email within a few days to accept the invitation and begin the class when the time is right for you. The content will be available to you for 60 days. $60 for 60 days of online  access for you and your support people.               Preterm Labor The normal length of a pregnancy is 39-41 weeks. Preterm labor is when labor starts before 37 completed weeks of pregnancy. Babies who are born prematurely and survive may not be fully developed and may be at an increased risk for long-term problems such as cerebral palsy, developmental delays, and vision and hearing problems. Babies who are born too early may have problems soon after birth. Problems may include regulating blood sugar, body temperature, heart rate, and breathing rate. These babies often have trouble with feeding. The risk of having problems is highest for babies who are born before 34 weeks of pregnancy. What are the causes? The exact cause of this condition is not known. What increases the risk? You are more likely to have preterm labor if you have certain risk factors that relate to your medical history, problems with present and past pregnancies, and lifestyle factors. Medical history  You have abnormalities of the uterus, including a short cervix.  You have STIs (sexually transmitted infections), or other infections of the urinary tract and the vagina.  You have chronic illnesses, such as blood clotting problems, diabetes, or high blood pressure.  You are overweight or underweight. Present and past pregnancies  You have had preterm labor before.  You are pregnant with twins or other multiples.  You have been diagnosed with a condition in which the placenta covers your cervix (placenta previa).  You waited less than 6 months between giving birth and becoming pregnant again.  Your unborn baby has some abnormalities.  You have vaginal bleeding during pregnancy.  You became pregnant through in vitro fertilization (IVF). Lifestyle and environmental factors  You use tobacco products.  You drink alcohol.  You use street drugs.  You have stress and no social support.  You  experience domestic violence.  You are exposed to certain chemicals or environmental pollutants. Other factors  You are younger than age 39 or older than age 16. What are the signs or symptoms? Symptoms of this condition include:  Cramps similar to those that can happen during a menstrual period. The cramps may happen with diarrhea.  Pain in the abdomen or lower back.  Regular contractions that may feel like tightening of the abdomen.  A feeling of increased pressure in the pelvis.  Increased watery or bloody mucus discharge from the vagina.  Water breaking (ruptured amniotic sac). How is this diagnosed? This condition is diagnosed based on:  Your medical history and a physical exam.  A pelvic exam.  An ultrasound.  Monitoring your uterus for contractions.  Other tests, including: ? A swab of the cervix to check for a chemical called fetal fibronectin. ? Urine tests. How is this  treated? Treatment for this condition depends on the length of your pregnancy, your condition, and the health of your baby. Treatment may include:  Taking medicines, such as: ? Hormone medicines. These may be given early in pregnancy to help support the pregnancy. ? Medicines to stop contractions. ? Medicines to help mature the baby's lungs. These may be prescribed if the risk of delivery is high. ? Medicines to prevent your baby from developing cerebral palsy.  Bed rest. If the labor happens before 34 weeks of pregnancy, you may need to stay in the hospital.  Delivery of the baby. Follow these instructions at home:  Do not use any products that contain nicotine or tobacco, such as cigarettes, e-cigarettes, and chewing tobacco. If you need help quitting, ask your health care provider.  Do not drink alcohol.  Take over-the-counter and prescription medicines only as told by your health care provider.  Rest as told by your health care provider.  Return to your normal activities as told by  your health care provider. Ask your health care provider what activities are safe for you.  Keep all follow-up visits as told by your health care provider. This is important.   How is this prevented? To increase your chance of having a full-term pregnancy:  Do not use street drugs or medicines that have not been prescribed to you during your pregnancy.  Talk with your health care provider before taking any herbal supplements, even if you have been taking them regularly.  Make sure you gain a healthy amount of weight during your pregnancy.  Watch for infection. If you think that you might have an infection, get it checked right away. Symptoms of infection may include: ? Fever. ? Abnormal vaginal discharge or discharge that smells bad. ? Pain or burning with urination. ? Needing to urinate urgently. ? Frequently urinating or passing small amounts of urine frequently. ? Blood in your urine. ? Urine that smells bad or unusual.  Tell your health care provider if you have had preterm labor before. Contact a health care provider if:  You think you are going into preterm labor.  You have signs or symptoms of preterm labor.  You have symptoms of infection. Get help right away if:  You are having regular, painful contractions every 5 minutes or less.  Your water breaks. Summary  Preterm labor is labor that starts before you reach 37 weeks of pregnancy.  Delivering your baby early increases your baby's risk of developing lifelong problems.  The exact cause of preterm labor is unknown. However, having an abnormal uterus, an STI (sexually transmitted infection), or vaginal bleeding during pregnancy increases your risk for preterm labor.  Keep all follow-up visits as told by your health care provider. This is important.  Contact a health care provider if you have signs or symptoms of preterm labor. This information is not intended to replace advice given to you by your health care  provider. Make sure you discuss any questions you have with your health care provider. Document Revised: 11/24/2019 Document Reviewed: 11/24/2019 Elsevier Patient Education  2021 Elsevier Inc.        Oral Glucose Tolerance Test During Pregnancy Why am I having this test? The oral glucose tolerance test (OGTT) is done to check how your body processes blood sugar (glucose). This is one of several tests used to diagnose diabetes that develops during pregnancy (gestational diabetes mellitus). Gestational diabetes is a short-term form of diabetes that some women develop while they are pregnant.  It usually occurs during the second trimester of pregnancy and goes away after delivery. Testing, or screening, for gestational diabetes usually occurs at weeks 24-28 of pregnancy. You may have the OGTT test after having a 1-hour glucose screening test if the results from that test indicate that you may have gestational diabetes. This test may also be needed if:  You have a history of gestational diabetes.  There is a history of giving birth to very large babies or of losing pregnancies (having stillbirths).  You have signs and symptoms of diabetes, such as: ? Changes in your eyesight. ? Tingling or numbness in your hands or feet. ? Changes in hunger, thirst, and urination, and these are not explained by your pregnancy. What is being tested? This test measures the amount of glucose in your blood at different times during a period of 3 hours. This shows how well your body can process glucose. What kind of sample is taken? Blood samples are required for this test. They are usually collected by inserting a needle into a blood vessel.   How do I prepare for this test?  For 3 days before your test, eat normally. Have plenty of carbohydrate-rich foods.  Follow instructions from your health care provider about: ? Eating or drinking restrictions on the day of the test. You may be asked not to eat or drink  anything other than water (to fast) starting 8-10 hours before the test. ? Changing or stopping your regular medicines. Some medicines may interfere with this test. Tell a health care provider about:  All medicines you are taking, including vitamins, herbs, eye drops, creams, and over-the-counter medicines.  Any blood disorders you have.  Any surgeries you have had.  Any medical conditions you have. What happens during the test? First, your blood glucose will be measured. This is referred to as your fasting blood glucose because you fasted before the test. Then, you will drink a glucose solution that contains a certain amount of glucose. Your blood glucose will be measured again 1, 2, and 3 hours after you drink the solution. This test takes about 3 hours to complete. You will need to stay at the testing location during this time. During the testing period:  Do not eat or drink anything other than the glucose solution.  Do not exercise.  Do not use any products that contain nicotine or tobacco, such as cigarettes, e-cigarettes, and chewing tobacco. These can affect your test results. If you need help quitting, ask your health care provider. The testing procedure may vary among health care providers and hospitals. How are the results reported? Your results will be reported as milligrams of glucose per deciliter of blood (mg/dL) or millimoles per liter (mmol/L). There is more than one source for screening and diagnosis reference values used to diagnose gestational diabetes. Your health care provider will compare your results to normal values that were established after testing a large group of people (reference values). Reference values may vary among labs and hospitals. For this test (Carpenter-Coustan), reference values are:  Fasting: 95 mg/dL (5.3 mmol/L).  1 hour: 180 mg/dL (53.6 mmol/L).  2 hour: 155 mg/dL (8.6 mmol/L).  3 hour: 140 mg/dL (7.8 mmol/L). What do the results  mean? Results below the reference values are considered normal. If two or more of your blood glucose levels are at or above the reference values, you may be diagnosed with gestational diabetes. If only one level is high, your health care provider may suggest repeat testing  or other tests to confirm a diagnosis. Talk with your health care provider about what your results mean. Questions to ask your health care provider Ask your health care provider, or the department that is doing the test:  When will my results be ready?  How will I get my results?  What are my treatment options?  What other tests do I need?  What are my next steps? Summary  The oral glucose tolerance test (OGTT) is one of several tests used to diagnose diabetes that develops during pregnancy (gestational diabetes mellitus). Gestational diabetes is a short-term form of diabetes that some women develop while they are pregnant.  You may have the OGTT test after having a 1-hour glucose screening test if the results from that test show that you may have gestational diabetes. You may also have this test if you have any symptoms or risk factors for this type of diabetes.  Talk with your health care provider about what your results mean. This information is not intended to replace advice given to you by your health care provider. Make sure you discuss any questions you have with your health care provider. Document Revised: 03/31/2020 Document Reviewed: 03/31/2020 Elsevier Patient Education  2021 Elsevier Inc.        Round Ligament Pain  The round ligament is a cord of muscle and tissue that helps support the uterus. It can become a source of pain during pregnancy if it becomes stretched or twisted as the baby grows. The pain usually begins in the second trimester (13-28 weeks) of pregnancy, and it can come and go until the baby is delivered. It is not a serious problem, and it does not cause harm to the baby. Round  ligament pain is usually a short, sharp, and pinching pain, but it can also be a dull, lingering, and aching pain. The pain is felt in the lower side of the abdomen or in the groin. It usually starts deep in the groin and moves up to the outside of the hip area. The pain may occur when you:  Suddenly change position, such as quickly going from a sitting to standing position.  Roll over in bed.  Cough or sneeze.  Do physical activity. Follow these instructions at home:  Watch your condition for any changes.  When the pain starts, relax. Then try any of these methods to help with the pain: ? Sitting down. ? Flexing your knees up to your abdomen. ? Lying on your side with one pillow under your abdomen and another pillow between your legs. ? Sitting in a warm bath for 15-20 minutes or until the pain goes away.  Take over-the-counter and prescription medicines only as told by your health care provider.  Move slowly when you sit down or stand up.  Avoid long walks if they cause pain.  Stop or reduce your physical activities if they cause pain.  Keep all follow-up visits as told by your health care provider. This is important.   Contact a health care provider if:  Your pain does not go away with treatment.  You feel pain in your back that you did not have before.  Your medicine is not helping. Get help right away if:  You have a fever or chills.  You develop uterine contractions.  You have vaginal bleeding.  You have nausea or vomiting.  You have diarrhea.  You have pain when you urinate. Summary  Round ligament pain is felt in the lower abdomen or groin.  It is usually a short, sharp, and pinching pain. It can also be a dull, lingering, and aching pain.  This pain usually begins in the second trimester (13-28 weeks). It occurs because the uterus is stretching with the growing baby, and it is not harmful to the baby.  You may notice the pain when you suddenly change  position, when you cough or sneeze, or during physical activity.  Relaxing, flexing your knees to your abdomen, lying on one side, or taking a warm bath may help to get rid of the pain.  Get help from your health care provider if the pain does not go away or if you have vaginal bleeding, nausea, vomiting, diarrhea, or painful urination. This information is not intended to replace advice given to you by your health care provider. Make sure you discuss any questions you have with your health care provider. Document Revised: 04/09/2018 Document Reviewed: 04/09/2018 Elsevier Patient Education  2021 [employer]

## 2020-11-16 NOTE — Progress Notes (Signed)
ROB, c/o pressure like baby is about to come out.

## 2020-11-16 NOTE — Progress Notes (Signed)
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 Ebony Snyder presented to her appointment alone.   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 female. Her current pregnancy has completed [redacted]w[redacted]d (Estimated Date of Delivery: 03/13/21).  Ebony Snyder denied exposure to environmental toxins or chemical agents. She denied the use of alcohol, tobacco or street drugs. She reported taking prenatal vitamins. She denied significant viral illnesses, fevers, and bleeding during the course of her pregnancy. Her medical and surgical histories were noncontributory.  Family History  A three generation pedigree was drafted and reviewed. Both family histories were reviewed and found to be noncontributory for birth defects, intellectual disability, recurrent pregnancy loss, and known genetic conditions. Ebony Snyder had limited information about portions of her and the father of the pregnancy's extended family histories; thus, risk assessment was limited.  The patient's ancestry is African American. The father of the pregnancy's ancestry is African American. Ebony Snyder believes he is multiracial but did not have further information about his ethnic background.  Ashkenazi Jewish ancestry and consanguinity were denied. Pedigree will be scanned under Media.  Discussion  Spinal muscular atrophy:  Ebony Snyder was referred for genetic counseling as she was found to have 2 copies of the SMN1 gene on Horizon-14 carrier screening; however, she also has the c.*3+80T>G polymorphism of SMN1 in intron 7 (also known as [id number]). This puts her at increased risk (1 in 54) to be a silent 2+0 carrier for spinal muscular atrophy (SMA). This also means that she has a  97% chance of not being a carrier for SMA.  We discussed that SMA is a condition caused by mutations in the SMN1 gene. Typically, individuals have two copies of the SMN1 gene, with one copy present on each chromosome. In SMA silent carriers, both copies of the SMN1 gene are present on one chromosome, with no copies of SMN1 present on the other chromosome.   We reviewed that SMA is characterized by progressive muscle weakness and atrophy due to degeneration and loss of anterior horn cells (lower motor neurons) in the spinal cord and brain stem. We discussed the different types of SMA, including differences in severity and age of onset. We also reviewed the autosomal recessive inheritance pattern of SMA. We discussed that Ebony Snyder only has a chance of having a child with SMA if both she and the father of the baby are carriers for the condition. Based on the pan-ethnic carrier frequency for SMA, the father of the pregnancy currently has a 1 in 78 chance of being a carrier of SMA. Without partner screening to refine risk and based on Ebony Snyder's result and the pan-ethnic carrier frequency for SMA, the couple currently has a 1 in 6664 (0.02%) risk of having a child with SMA. If the father of the pregnancy were found to have 2 copies of SMN1, his risk of being a carrier would be reduced but not eliminated. However, if both Ebony Snyder and the father of the pregnancy are carriers of SMA, there would be a 1 in 4 (25%) chance of them having an affected fetus.   Other carrier screening result:  Ebony Snyder carrier screening was negative for the other 13 conditions screened. Thus, her risk to be  a carrier for these additional conditions (listed separately in the laboratory report) has been reduced but not eliminated. This also significantly reduces her risk of having a child affected by one of these conditions. We discussed that carrier testing for SMA is recommended for the father of the pregnancy. Ebony Snyder indicated that  she is interested in pursuing partner carrier screening.  Aneuploidy screening result:  We also reviewed that Ebony Snyder had Panorama NIPS through the laboratory [facility] that was low-risk for fetal aneuploidies. We reviewed that these results showed a less than 1 in 10,000 risk for trisomies 21, 18 and 13, and monosomy X (Turner syndrome).  In addition, the risk for triploidy and sex chromosome trisomies (47,XXX and 47,XXY) was also low. Ebony Snyder elected to have cfDNA analysis for 22q11.2 deletion syndrome, which was also low risk (1 in 9000). We reviewed that while this testing identifies 94-99% of pregnancies with trisomy 38, trisomy 31, trisomy 12, sex chromosome aneuploidies, and triploidy, it is NOT diagnostic. A positive test result requires confirmation by CVS or amniocentesis, and a negative test result does not rule out a fetal chromosome abnormality. She also understands that this testing does not identify all genetic conditions.  Diagnostic testing:  Ebony Snyder was also counseled regarding diagnostic testing via amniocentesis. We discussed the technical aspects of the procedure and quoted up to a 1 in 500 (0.2%) risk for spontaneous pregnancy loss or other adverse pregnancy outcomes as a result of amniocentesis. Cultured cells from an amniocentesis sample allow for the visualization of a fetal karyotype, which can detect >99% of large chromosomal aberrations. Chromosomal microarray can also be performed to identify smaller deletions or duplications of fetal chromosomal material. Amniocentesis could also be performed to assess whether the baby is affected by SMA. After careful consideration, Ebony Snyder declined amniocentesis at this time. She understands that amniocentesis is available at any point after 16 weeks of pregnancy and that she may opt to undergo the procedure at a later date should she change her mind.  Plan:  Ebony Snyder is interested in pursuing SMA carrier screening for the father of the  pregnancy. However, she wanted to discuss this option with him prior to ordering testing. We made a plan for Ms. Mcdermid to message me via MyChart if partner carrier screening is desired. If she sends me a photo of the father of the pregnancy's insurance card, I can perform a benefits investigation to determine the expected out of pocket cost before ordering testing. If he does not have health insurance, I may be able to facilitate free testing for him through the laboratory [facility]'s Patient Assistance Program.  I counseled Ms. Mcmichen regarding the above risks and available options. The approximate face-to-face time with the genetic counselor was 25 minutes.  In summary:  Discussed carrier screening results and options for follow-up testing  Increased risk (1 in 34, ~3%) to be silent carrier for spinal muscular atrophy  Interested in partner carrier screening. Will discuss with father of the pregnancy and contact me if he is interested. I will facilitate sample collection from there  Reviewed low-risk NIPS result  Reduction in risk for Down syndrome, trisomy 92, trisomy 50, triploidy, sex chromosome aneuploidies, and 22q11.2 deletion syndrome  Offered additional testing and screening  Declined amniocentesis  Reviewed family history concerns   Gershon Crane, MS, Aeronautical engineer

## 2020-11-17 NOTE — Progress Notes (Signed)
Patient was assessed and managed by nursing staff during this encounter. I have reviewed the chart and agree with the documentation and plan. I have also made any necessary editorial changes.  Lawrence A Bass, MD 11/17/2020 11:11 AM   

## 2020-12-14 ENCOUNTER — Other Ambulatory Visit: Payer: 59

## 2020-12-14 ENCOUNTER — Other Ambulatory Visit: Payer: Self-pay

## 2020-12-14 ENCOUNTER — Ambulatory Visit (INDEPENDENT_AMBULATORY_CARE_PROVIDER_SITE_OTHER): Payer: 59 | Admitting: Women's Health

## 2020-12-14 VITALS — BP 137/81 | HR 88 | Wt 189.0 lb

## 2020-12-14 DIAGNOSIS — Z3480 Encounter for supervision of other normal pregnancy, unspecified trimester: Secondary | ICD-10-CM

## 2020-12-14 DIAGNOSIS — Z23 Encounter for immunization: Secondary | ICD-10-CM | POA: Diagnosis not present

## 2020-12-14 DIAGNOSIS — Z148 Genetic carrier of other disease: Secondary | ICD-10-CM

## 2020-12-14 DIAGNOSIS — R760 Raised antibody titer: Secondary | ICD-10-CM

## 2020-12-14 DIAGNOSIS — Z3A27 27 weeks gestation of pregnancy: Secondary | ICD-10-CM

## 2020-12-14 NOTE — Progress Notes (Signed)
+   Fetal movement. No complaints.  

## 2020-12-14 NOTE — Progress Notes (Addendum)
Subjective:  Ebony Snyder is a 25 y.o. G1P0 at [redacted]w[redacted]d being seen today for ongoing prenatal care.  She is currently monitored for the following issues for this low-risk pregnancy and has Supervision of other normal pregnancy, antepartum; Headache in pregnancy, antepartum; Abnormal antibody titer; Pelvic pressure in pregnancy; and Genetic carrier on their problem list.  Patient reports no complaints.  Contractions: Not present. Vag. Bleeding: None.  Movement: Present. Denies leaking of fluid.   The following portions of the patient's history were reviewed and updated as appropriate: allergies, current medications, past family history, past medical history, past social history, past surgical history and problem list. Problem list updated.  Objective:   Vitals:   12/14/20 0831  BP: 137/81  Pulse: 88  Weight: 189 lb (85.7 kg)    Fetal Status: Fetal Heart Rate (bpm): 141 Fundal Height: 28 cm Movement: Present     General:  Alert, oriented and cooperative. Patient is in no acute distress.  Skin: Skin is warm and dry. No rash noted.   Cardiovascular: Normal heart rate noted  Respiratory: Normal respiratory effort, no problems with respiration noted  Abdomen: Soft, gravid, appropriate for gestational age. Pain/Pressure: Absent     Pelvic: Vag. Bleeding: None     Cervical exam deferred        Extremities: Normal range of motion.  Edema: Trace  Mental Status: Normal mood and affect. Normal behavior. Normal judgment and thought content.   Urinalysis:      Assessment and Plan:  Pregnancy: G1P0 at [redacted]w[redacted]d  1. Supervision of other normal pregnancy, antepartum - CBC - RPR - HIV Antibody (routine testing w rflx) - Tdap vaccine greater than or equal to 7yo IM - Glucose Tolerance, 2 Hours w/1 Hour - BP cuff has not been checked for fit, will check at next visit and if functioning appropriately will have virtual visits PRN - discussed travel in pregnancy, pt advised to stay well-hydrated, no  sit for extended periods of time, frequent movement, especially if flying/driving, bring copy of medical records and locate closest hospital with L&D floor to present to with pregnancy related emergency  2. 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 It has not been reported as a cause of HDN. Titer not indicated.  4. [redacted] weeks gestation of pregnancy  Preterm labor symptoms and general obstetric precautions including but not limited to vaginal bleeding, contractions, leaking of fluid and fetal movement were reviewed in detail with the patient. I discussed the assessment and treatment plan with the patient. The patient was provided an opportunity to ask questions and all were answered. The patient agreed with the plan and demonstrated an understanding of the instructions. The patient was advised to call back or seek an in-person office evaluation/go to [facility] at [facility] for any urgent or concerning symptoms. Please refer to After Visit Summary for other counseling recommendations.  Return in about 2 weeks (around 12/28/2020) for in-person LOB/APP OK/BP cuff check.   Nickie Deren, Odie Sera, NP

## 2020-12-14 NOTE — Patient Instructions (Addendum)
Maternity Assessment Unit (MAU)  The Maternity Assessment Unit (MAU) is located at the [facility] at [facility]. The address is: [address], [place], [place], [place] [address]. Please see map below for additional directions.    The Maternity Assessment Unit is designed to help you during your pregnancy, and for up to 6 weeks after delivery, with any pregnancy- or postpartum-related emergencies, if you think you are in labor, or if your water has broken. For example, if you experience nausea and vomiting, vaginal bleeding, severe abdominal or pelvic pain, elevated blood pressure or other problems related to your pregnancy or postpartum time, please come to the Maternity Assessment Unit for assistance.        [email or web address]">  Third Trimester of Pregnancy  The third trimester of pregnancy is from week 28 through week 40. This is months 7 through 9. The third trimester is a time when the unborn baby (fetus) is growing rapidly. At the end of the ninth month, the fetus is about 20 inches long and weighs 6-10 pounds. Body changes during your third trimester During the third trimester, your body will continue to go through many changes. The changes vary and generally return to normal after your baby is born. Physical changes  Your weight will continue to increase. You can expect to gain 25-35 pounds (11-16 kg) by the end of the pregnancy if you begin pregnancy at a normal weight. If you are underweight, you can expect to gain 28-40 lb (about 13-18 kg), and if you are overweight, you can expect to gain 15-25 lb (about 7-11 kg).  You may begin to get stretch marks on your hips, abdomen, and breasts.  Your breasts will continue to grow and may hurt. A yellow fluid (colostrum) may leak from your breasts. This is the first milk you are producing for your baby.  You may have changes in your hair. These  can include thickening of your hair, rapid growth, and changes in texture. Some people also have hair loss during or after pregnancy, or hair that feels dry or thin.  Your belly button may stick out.  You may notice more swelling in your hands, face, or ankles. Health changes  You may have heartburn.  You may have constipation.  You may develop hemorrhoids.  You may develop swollen, bulging veins (varicose veins) in your legs.  You may have increased body aches in the pelvis, back, or thighs. This is due to weight gain and increased hormones that are relaxing your joints.  You may have increased tingling or numbness in your hands, arms, and legs. The skin on your abdomen may also feel numb.  You may feel short of breath because of your expanding uterus. Other changes  You may urinate more often because the fetus is moving lower into your pelvis and pressing on your bladder.  You may have more problems sleeping. This may be caused by the size of your abdomen, an increased need to urinate, and an increase in your body's metabolism.  You may notice the fetus "dropping," or moving lower in your abdomen (lightening).  You may have increased vaginal discharge.  You may notice that you have pain around your pelvic bone as your uterus distends. Follow these instructions at home: Medicines  Follow your health care provider's instructions regarding medicine use. Specific medicines may be either safe or unsafe to take during pregnancy. Do not take any medicines unless approved by your health care provider.  Take  a prenatal vitamin that contains at least 600 micrograms (mcg) of folic acid. Eating and drinking  Eat a healthy diet that includes fresh fruits and vegetables, whole grains, good sources of protein such as meat, eggs, or tofu, and low-fat dairy products.  Avoid raw meat and unpasteurized juice, milk, and cheese. These carry germs that can harm you and your baby.  Eat 4 or 5  small meals rather than 3 large meals a day.  You may need to take these actions to prevent or treat constipation: ? Drink enough fluid to keep your urine pale yellow. ? Eat foods that are high in fiber, such as beans, whole grains, and fresh fruits and vegetables. ? Limit foods that are high in fat and processed sugars, such as fried or sweet foods. Activity  Exercise only as directed by your health care provider. Most people can continue their usual exercise routine during pregnancy. Try to exercise for 30 minutes at least 5 days a week. Stop exercising if you experience contractions in the uterus.  Stop exercising if you develop pain or cramping in the lower abdomen or lower back.  Avoid heavy lifting.  Do not exercise if it is very hot or humid or if you are at a high altitude.  If you choose to, you may continue to have sex unless your health care provider tells you not to. Relieving pain and discomfort  Take frequent breaks and rest with your legs raised (elevated) if you have leg cramps or low back pain.  Take warm sitz baths to soothe any pain or discomfort caused by hemorrhoids. Use hemorrhoid cream if your health care provider approves.  Wear a supportive bra to prevent discomfort from breast tenderness.  If you develop varicose veins: ? Wear support hose as told by your health care provider. ? Elevate your feet for 15 minutes, 3-4 times a day. ? Limit salt in your diet. Safety  Talk to your health care provider before traveling far distances.  Do not use hot tubs, steam rooms, or saunas.  Wear your seat belt at all times when driving or riding in a car.  Talk with your health care provider if someone is verbally or physically abusive to you. Preparing for birth To prepare for the arrival of your baby:  Take prenatal classes to understand, practice, and ask questions about labor and delivery.  Visit the hospital and tour the maternity area.  Purchase a  rear-facing car seat and make sure you know how to install it in your car.  Prepare the baby's room or sleeping area. Make sure to remove all pillows and stuffed animals from the baby's crib to prevent suffocation. General instructions  Avoid cat litter boxes and soil used by cats. These carry germs that can cause birth defects in the baby. If you have a cat, ask someone to clean the litter box for you.  Do not douche or use tampons. Do not use scented sanitary pads.  Do not use any products that contain nicotine or tobacco, such as cigarettes, e-cigarettes, and chewing tobacco. If you need help quitting, ask your health care provider.  Do not use any herbal remedies, illegal drugs, or medicines that were not prescribed to you. Chemicals in these products can harm your baby.  Do not drink alcohol.  You will have more frequent prenatal exams during the third trimester. During a routine prenatal visit, your health care provider will do a physical exam, perform tests, and discuss your overall health.  Keep all follow-up visits. This is important. Where to find more information  American Pregnancy Association: americanpregnancy.org  [employer]: https://www.todd-brady.net/  Office on [employer] Health: MightyReward.co.nz Contact a health care provider if you have:  A fever.  Mild pelvic cramps, pelvic pressure, or nagging pain in your abdominal area or lower back.  Vomiting or diarrhea.  Bad-smelling vaginal discharge or foul-smelling urine.  Pain when you urinate.  A headache that does not go away when you take medicine.  Visual changes or see spots in front of your eyes. Get help right away if:  Your water breaks.  You have regular contractions less than 5 minutes apart.  You have spotting or bleeding from your vagina.  You have severe abdominal pain.  You have difficulty breathing.  You have chest pain.  You have  fainting spells.  You have not felt your baby move for the time period told by your health care provider.  You have new or increased pain, swelling, or redness in an arm or leg. Summary  The third trimester of pregnancy is from week 28 through week 40 (months 7 through 9).  You may have more problems sleeping. This can be caused by the size of your abdomen, an increased need to urinate, and an increase in your body's metabolism.  You will have more frequent prenatal exams during the third trimester. Keep all follow-up visits. This is important. This information is not intended to replace advice given to you by your health care provider. Make sure you discuss any questions you have with your health care provider. Document Revised: 03/30/2020 Document Reviewed: 02/04/2020 Elsevier Patient Education  2021 Elsevier Inc.        Preterm Labor The normal length of a pregnancy is 39-41 weeks. Preterm labor is when labor starts before 37 completed weeks of pregnancy. Babies who are born prematurely and survive may not be fully developed and may be at an increased risk for long-term problems such as cerebral palsy, developmental delays, and vision and hearing problems. Babies who are born too early may have problems soon after birth. Problems may include regulating blood sugar, body temperature, heart rate, and breathing rate. These babies often have trouble with feeding. The risk of having problems is highest for babies who are born before 34 weeks of pregnancy. What are the causes? The exact cause of this condition is not known. What increases the risk? You are more likely to have preterm labor if you have certain risk factors that relate to your medical history, problems with present and past pregnancies, and lifestyle factors. Medical history  You have abnormalities of the uterus, including a short cervix.  You have STIs (sexually transmitted infections), or other infections of the  urinary tract and the vagina.  You have chronic illnesses, such as blood clotting problems, diabetes, or high blood pressure.  You are overweight or underweight. Present and past pregnancies  You have had preterm labor before.  You are pregnant with twins or other multiples.  You have been diagnosed with a condition in which the placenta covers your cervix (placenta previa).  You waited less than 6 months between giving birth and becoming pregnant again.  Your unborn baby has some abnormalities.  You have vaginal bleeding during pregnancy.  You became pregnant through in vitro fertilization (IVF). Lifestyle and environmental factors  You use tobacco products.  You drink alcohol.  You use street drugs.  You have stress and no social support.  You  experience domestic violence.  You are exposed to certain chemicals or environmental pollutants. Other factors  You are younger than age 26 or older than age 26. What are the signs or symptoms? Symptoms of this condition include:  Cramps similar to those that can happen during a menstrual period. The cramps may happen with diarrhea.  Pain in the abdomen or lower back.  Regular contractions that may feel like tightening of the abdomen.  A feeling of increased pressure in the pelvis.  Increased watery or bloody mucus discharge from the vagina.  Water breaking (ruptured amniotic sac). How is this diagnosed? This condition is diagnosed based on:  Your medical history and a physical exam.  A pelvic exam.  An ultrasound.  Monitoring your uterus for contractions.  Other tests, including: ? A swab of the cervix to check for a chemical called fetal fibronectin. ? Urine tests. How is this treated? Treatment for this condition depends on the length of your pregnancy, your condition, and the health of your baby. Treatment may include:  Taking medicines, such as: ? Hormone medicines. These may be given early in pregnancy  to help support the pregnancy. ? Medicines to stop contractions. ? Medicines to help mature the baby's lungs. These may be prescribed if the risk of delivery is high. ? Medicines to prevent your baby from developing cerebral palsy.  Bed rest. If the labor happens before 34 weeks of pregnancy, you may need to stay in the hospital.  Delivery of the baby. Follow these instructions at home:  Do not use any products that contain nicotine or tobacco, such as cigarettes, e-cigarettes, and chewing tobacco. If you need help quitting, ask your health care provider.  Do not drink alcohol.  Take over-the-counter and prescription medicines only as told by your health care provider.  Rest as told by your health care provider.  Return to your normal activities as told by your health care provider. Ask your health care provider what activities are safe for you.  Keep all follow-up visits as told by your health care provider. This is important.   How is this prevented? To increase your chance of having a full-term pregnancy:  Do not use street drugs or medicines that have not been prescribed to you during your pregnancy.  Talk with your health care provider before taking any herbal supplements, even if you have been taking them regularly.  Make sure you gain a healthy amount of weight during your pregnancy.  Watch for infection. If you think that you might have an infection, get it checked right away. Symptoms of infection may include: ? Fever. ? Abnormal vaginal discharge or discharge that smells bad. ? Pain or burning with urination. ? Needing to urinate urgently. ? Frequently urinating or passing small amounts of urine frequently. ? Blood in your urine. ? Urine that smells bad or unusual.  Tell your health care provider if you have had preterm labor before. Contact a health care provider if:  You think you are going into preterm labor.  You have signs or symptoms of preterm  labor.  You have symptoms of infection. Get help right away if:  You are having regular, painful contractions every 5 minutes or less.  Your water breaks. Summary  Preterm labor is labor that starts before you reach 37 weeks of pregnancy.  Delivering your baby early increases your baby's risk of developing lifelong problems.  The exact cause of preterm labor is unknown. However, having an abnormal uterus, an STI (  sexually transmitted infection), or vaginal bleeding during pregnancy increases your risk for preterm labor.  Keep all follow-up visits as told by your health care provider. This is important.  Contact a health care provider if you have signs or symptoms of preterm labor. This information is not intended to replace advice given to you by your health care provider. Make sure you discuss any questions you have with your health care provider. Document Revised: 11/24/2019 Document Reviewed: 11/24/2019 Elsevier Patient Education  2021 Elsevier Inc.        Oral Glucose Tolerance Test During Pregnancy Why am I having this test? The oral glucose tolerance test (OGTT) is done to check how your body processes blood sugar (glucose). This is one of several tests used to diagnose diabetes that develops during pregnancy (gestational diabetes mellitus). Gestational diabetes is a short-term form of diabetes that some women develop while they are pregnant. It usually occurs during the second trimester of pregnancy and goes away after delivery. Testing, or screening, for gestational diabetes usually occurs at weeks 24-28 of pregnancy. You may have the OGTT test after having a 1-hour glucose screening test if the results from that test indicate that you may have gestational diabetes. This test may also be needed if:  You have a history of gestational diabetes.  There is a history of giving birth to very large babies or of losing pregnancies (having stillbirths).  You have signs and  symptoms of diabetes, such as: ? Changes in your eyesight. ? Tingling or numbness in your hands or feet. ? Changes in hunger, thirst, and urination, and these are not explained by your pregnancy. What is being tested? This test measures the amount of glucose in your blood at different times during a period of 3 hours. This shows how well your body can process glucose. What kind of sample is taken? Blood samples are required for this test. They are usually collected by inserting a needle into a blood vessel.   How do I prepare for this test?  For 3 days before your test, eat normally. Have plenty of carbohydrate-rich foods.  Follow instructions from your health care provider about: ? Eating or drinking restrictions on the day of the test. You may be asked not to eat or drink anything other than water (to fast) starting 8-10 hours before the test. ? Changing or stopping your regular medicines. Some medicines may interfere with this test. Tell a health care provider about:  All medicines you are taking, including vitamins, herbs, eye drops, creams, and over-the-counter medicines.  Any blood disorders you have.  Any surgeries you have had.  Any medical conditions you have. What happens during the test? First, your blood glucose will be measured. This is referred to as your fasting blood glucose because you fasted before the test. Then, you will drink a glucose solution that contains a certain amount of glucose. Your blood glucose will be measured again 1, 2, and 3 hours after you drink the solution. This test takes about 3 hours to complete. You will need to stay at the testing location during this time. During the testing period:  Do not eat or drink anything other than the glucose solution.  Do not exercise.  Do not use any products that contain nicotine or tobacco, such as cigarettes, e-cigarettes, and chewing tobacco. These can affect your test results. If you need help quitting, ask  your health care provider. The testing procedure may vary among health care providers and hospitals. How  are the results reported? Your results will be reported as milligrams of glucose per deciliter of blood (mg/dL) or millimoles per liter (mmol/L). There is more than one source for screening and diagnosis reference values used to diagnose gestational diabetes. Your health care provider will compare your results to normal values that were established after testing a large group of people (reference values). Reference values may vary among labs and hospitals. For this test (Carpenter-Coustan), reference values are:  Fasting: 95 mg/dL (5.3 mmol/L).  1 hour: 180 mg/dL (40.9 mmol/L).  2 hour: 155 mg/dL (8.6 mmol/L).  3 hour: 140 mg/dL (7.8 mmol/L). What do the results mean? Results below the reference values are considered normal. If two or more of your blood glucose levels are at or above the reference values, you may be diagnosed with gestational diabetes. If only one level is high, your health care provider may suggest repeat testing or other tests to confirm a diagnosis. Talk with your health care provider about what your results mean. Questions to ask your health care provider Ask your health care provider, or the department that is doing the test:  When will my results be ready?  How will I get my results?  What are my treatment options?  What other tests do I need?  What are my next steps? Summary  The oral glucose tolerance test (OGTT) is one of several tests used to diagnose diabetes that develops during pregnancy (gestational diabetes mellitus). Gestational diabetes is a short-term form of diabetes that some women develop while they are pregnant.  You may have the OGTT test after having a 1-hour glucose screening test if the results from that test show that you may have gestational diabetes. You may also have this test if you have any symptoms or risk factors for this type of  diabetes.  Talk with your health care provider about what your results mean. This information is not intended to replace advice given to you by your health care provider. Make sure you discuss any questions you have with your health care provider. Document Revised: 03/31/2020 Document Reviewed: 03/31/2020 Elsevier Patient Education  2021 Elsevier Inc.       Tdap (Tetanus, Diphtheria, Pertussis) Vaccine: What You Need to Know 1. Why get vaccinated? Tdap vaccine can prevent tetanus, diphtheria, and pertussis. Diphtheria and pertussis spread from person to person. Tetanus enters the body through cuts or wounds.  TETANUS (T) causes painful stiffening of the muscles. Tetanus can lead to serious health problems, including being unable to open the mouth, having trouble swallowing and breathing, or death.  DIPHTHERIA (D) can lead to difficulty breathing, heart failure, paralysis, or death.  PERTUSSIS (aP), also known as "whooping cough," can cause uncontrollable, violent coughing that makes it hard to breathe, eat, or drink. Pertussis can be extremely serious especially in babies and young children, causing pneumonia, convulsions, brain damage, or death. In teens and adults, it can cause weight loss, loss of bladder control, passing out, and rib fractures from severe coughing. 2. Tdap vaccine Tdap is only for children 7 years and older, adolescents, and adults.  Adolescents should receive a single dose of Tdap, preferably at age 24 or 12 years. Pregnant people should get a dose of Tdap during every pregnancy, preferably during the early part of the third trimester, to help protect the newborn from pertussis. Infants are most at risk for severe, life-threatening complications from pertussis. Adults who have never received Tdap should get a dose of Tdap. Also, adults should receive  a booster dose of either Tdap or Td (a different vaccine that protects against tetanus and diphtheria but not  pertussis) every 10 years, or after 5 years in the case of a severe or dirty wound or burn. Tdap may be given at the same time as other vaccines. 3. Talk with your health care provider Tell your vaccine provider if the person getting the vaccine:  Has had an allergic reaction after a previous dose of any vaccine that protects against tetanus, diphtheria, or pertussis, or has any severe, life-threatening allergies  Has had a coma, decreased level of consciousness, or prolonged seizures within 7 days after a previous dose of any pertussis vaccine (DTP, DTaP, or Tdap)  Has seizures or another nervous system problem  Has ever had Guillain-Barr Syndrome (also called "GBS")  Has had severe pain or swelling after a previous dose of any vaccine that protects against tetanus or diphtheria In some cases, your health care provider may decide to postpone Tdap vaccination until a future visit. People with minor illnesses, such as a cold, may be vaccinated. People who are moderately or severely ill should usually wait until they recover before getting Tdap vaccine.  Your health care provider can give you more information. 4. Risks of a vaccine reaction  Pain, redness, or swelling where the shot was given, mild fever, headache, feeling tired, and nausea, vomiting, diarrhea, or stomachache sometimes happen after Tdap vaccination. People sometimes faint after medical procedures, including vaccination. Tell your provider if you feel dizzy or have vision changes or ringing in the ears.  As with any medicine, there is a very remote chance of a vaccine causing a severe allergic reaction, other serious injury, or death. 5. What if there is a serious problem? An allergic reaction could occur after the vaccinated person leaves the clinic. If you see signs of a severe allergic reaction (hives, swelling of the face and throat, difficulty breathing, a fast heartbeat, dizziness, or weakness), call 9-1-1 and get the  person to the nearest hospital. For other signs that concern you, call your health care provider.  Adverse reactions should be reported to the Vaccine Adverse Event Reporting System (VAERS). Your health care provider will usually file this report, or you can do it yourself. Visit the VAERS website at www.vaers.LAgents.nohhs.gov or call 805-638-30141-680-789-8180. VAERS is only for reporting reactions, and VAERS staff members do not give medical advice. 6. The National Vaccine Injury Compensation Program The [employer] Vaccine Injury Compensation Program (VICP) is a federal program that was created to compensate people who may have been injured by certain vaccines. Claims regarding alleged injury or death due to vaccination have a time limit for filing, which may be as short as two years. Visit the VICP website at SpiritualWord.atwww.hrsa.gov/vaccinecompensation or call 67133425501-(517)491-2629 to learn about the program and about filing a claim. 7. How can I learn more?  Ask your health care provider.  Call your local or state health department.  Visit the website of the Food and Drug Administration (FDA) for vaccine package inserts and additional information at FinderList.nowww.fda.gov/vaccines-blood-biologics/vaccines.  Contact the Centers for Disease Control and Prevention (CDC): ? Call (317)632-02661-305-114-1261 (1-800-CDC-INFO) or ? Visit CDC's website at PicCapture.uywww.cdc.gov/vaccines. Vaccine Information Statement Tdap (Tetanus, Diphtheria, Pertussis) Vaccine (06/10/2020) This information is not intended to replace advice given to you by your health care provider. Make sure you discuss any questions you have with your health care provider. Document Revised: 07/06/2020 Document Reviewed: 07/06/2020 Elsevier Patient Education  2021 [employer]  Pregnancy and Travel Most pregnant women can safely travel until the last month of their pregnancy. Your health care provider may recommend limiting or avoiding travel depending on how far you are in the pregnancy and  if you have any medical or pregnancy problems. General travel tips Before you go:  Discuss your trip with your health care provider. Get examined shortly before you go.  Talk with your health care provider about compression stockings if you plan to travel for more than 4 hours a day.  Get a copy of your prenatal record. Take it with you.  Try to get names of health care providers and hospitals in the area where you will be visiting.  Pack any approved medicines and supplements. During your trip:  Ask for locations of health care providers and hospitals.  Wear flat, comfortable shoes.  Wear loose-fitting, comfortable clothes.  Wear compression stockings as told by your health care provider. These may prevent blood clots that arise from sitting for a long time.  Do leg exercises as told by your health care provider.  Eat a balanced diet, drink lots of fluid, and take your vitamins and supplements.  Take breaks to use the restroom and walk every 2 hours or during stops.  Move your arms and legs frequently when seated.  Rest. If your trip is long, lie down for 30 minutes or more with your feet slightly raised after you reach your destination.  Always wear a seat belt.   Tips for traveling to a foreign country Before you go:  Ask your health care provider if there are medicines that are safe for you to take if you get diarrhea, constipation, nausea, or vomiting.  Check with your health insurance provider about medical coverage abroad. Purchase travel [employer], if needed.  Make sure you are up to date on vaccines. During your trip:  Do not eat uncooked foods.  Do not eat food from buffets or food that is cold or sitting at room temperature.  Drink bottled beverages and water. Do not use ice.  Wash fruits and vegetables with clean water. If possible, peel them before eating.  Do not drink unpasteurized milk.  Wear insect repellent if there are mosquitoes or  other biting insects. Ask your health care provider which repellents are safe.   What do I need to know about traveling by car?  Wear your seat belt properly. The belt should be buckled below your abdomen, on your hip bones. The shoulder belt should be off to the side of your abdomen and across the center of your chest.  If you are in the front seat, sit as far away from the dashboard as possible to avoid getting hit hard if the airbag deploys in an accident. What do I need to know about traveling by bus?  Before making a reservation, ask whether your bus will have a restroom.  If you have to use the restroom, hold on to the seats and handrails as you walk. What do I need to know about traveling by train?  Before making a reservation, ask if your train will have a sleeping car and more than one restroom.  If you need to walk while the train is moving, hold on to seats and handrails. What do I need to know about traveling by airplane?  Before booking your trip, ask about the airline's rules about pregnancy. Pregnant women may be restricted from flying after a certain time of the pregnancy. Every airline has its  own rules.  Make sure you complete your trip before 36 weeks of pregnancy.  Ask whether the airplane cabin will be pressurized. Do not board an unpressurized plane that will fly above 7,000 ft (2,100 m).  Try to get an aisle seat so it is easier to get up, stretch, and use the bathroom.  Put all your medicines and medical records in your carry-on bag.  Avoid drinking caffeinated or carbonated beverages.  Avoid eating foods that may make you bloated.  If you need to walk through the airplane, hold on to the seats and handrails.  Wear your seat belt. What do I need to know about traveling by cruise ship?  Before booking your trip, ask the cruise ship company: ? Are pregnant women allowed on the ship? ? Is there a medical facility and health care provider on board? ? Does  the ship dock in places where there are health care providers and medical facilities?  Before booking your trip, ask your health care provider: ? Is it safe to take medicines if I get seasick? ? Is it safe to wear acupressure wristbands to prevent seasickness? If the answer is yes, consider buying one. Contact a health care provider if:  You have diarrhea.  You vomit.  You have nausea or motion sickness. Get help right away if:  You have vaginal bleeding.  You have persistent and uncontrolled vomiting or diarrhea.  You have pelvic or abdominal pain.  You have contractions.  Your water breaks.  You have a persistent headache.  Your eyesight changes or you see spots.  Your face or hands are swollen.  You have redness, pain, warmth, or swelling in your legs or ankles. Summary  Most pregnant women can safely travel until the last month of their pregnancy. Your health care provider may tell you to limit or avoid travel depending on how far you are in your pregnancy and if you have any medical or pregnancy problems.  Before you go on your trip, make sure you discuss your trip with your health care provider, get a copy of your prenatal record, and try to get information on medical centers and health care providers at your destination.  While on your trip, make sure you wear comfortable clothes and shoes, eat a balanced diet, drink plenty of fluids, take your vitamins and supplements, take breaks and rest often, and wear your seat belt.  Before booking a flight, ask about the airline's rules about pregnancy. Every airline has its own rules. Do the same for a train, bus, or cruise ship. This information is not intended to replace advice given to you by your health care provider. Make sure you discuss any questions you have with your health care provider. Document Revised: 05/16/2020 Document Reviewed: 05/16/2020 Elsevier Patient Education  2021 [employer].

## 2020-12-15 LAB — CBC
Hematocrit: 32.5 % — ABNORMAL LOW (ref 34.0–46.6)
Hemoglobin: 10.7 g/dL — ABNORMAL LOW (ref 11.1–15.9)
MCH: 28.2 pg (ref 26.6–33.0)
MCHC: 32.9 g/dL (ref 31.5–35.7)
MCV: 86 fL (ref 79–97)
Platelets: 276 10*3/uL (ref 150–450)
RBC: 3.8 x10E6/uL (ref 3.77–5.28)
RDW: 13.1 % (ref 11.7–15.4)
WBC: 8.5 10*3/uL (ref 3.4–10.8)

## 2020-12-15 LAB — GLUCOSE TOLERANCE, 2 HOURS W/ 1HR
Glucose, 1 hour: 202 mg/dL — ABNORMAL HIGH (ref 65–179)
Glucose, 2 hour: 148 mg/dL (ref 65–152)
Glucose, Fasting: 84 mg/dL (ref 65–91)

## 2020-12-15 LAB — HIV ANTIBODY (ROUTINE TESTING W REFLEX): HIV Screen 4th Generation wRfx: NONREACTIVE

## 2020-12-15 LAB — RPR: RPR Ser Ql: NONREACTIVE

## 2020-12-16 ENCOUNTER — Encounter: Payer: Self-pay | Admitting: Women's Health

## 2020-12-16 DIAGNOSIS — Z8632 Personal history of gestational diabetes: Secondary | ICD-10-CM | POA: Insufficient documentation

## 2020-12-16 DIAGNOSIS — O99019 Anemia complicating pregnancy, unspecified trimester: Secondary | ICD-10-CM | POA: Insufficient documentation

## 2020-12-16 DIAGNOSIS — O24419 Gestational diabetes mellitus in pregnancy, unspecified control: Secondary | ICD-10-CM | POA: Insufficient documentation

## 2020-12-16 NOTE — Progress Notes (Signed)
Please call patient to make her aware of GDM diagnosis based on blood sugar testing and please help patient to schedule needed appointments with nutrition. Patient also has anemia. Please make patient aware and send iron per protocol.  Thank you, Joni Reining

## 2020-12-20 ENCOUNTER — Other Ambulatory Visit: Payer: Self-pay

## 2020-12-20 DIAGNOSIS — O24419 Gestational diabetes mellitus in pregnancy, unspecified control: Secondary | ICD-10-CM

## 2020-12-20 DIAGNOSIS — D649 Anemia, unspecified: Secondary | ICD-10-CM

## 2020-12-20 MED ORDER — ACCU-CHEK GUIDE W/DEVICE KIT: 1.0000 | PACK | Freq: Four times a day (QID) | 0 refills | Status: DC

## 2020-12-20 MED ORDER — ACCU-CHEK SOFTCLIX LANCETS MISC: 1.0000 | Freq: Four times a day (QID) | 12 refills | Status: DC

## 2020-12-20 MED ORDER — ACCU-CHEK SMARTVIEW VI STRP: ORAL_STRIP | 12 refills | Status: DC

## 2020-12-20 MED ORDER — FERROUS SULFATE 325 (65 FE) MG PO TABS: 325.0000 mg | ORAL_TABLET | Freq: Once | ORAL | 1 refills | Status: DC

## 2020-12-20 NOTE — Progress Notes (Signed)
GDM supplies sent as directed.

## 2020-12-22 ENCOUNTER — Telehealth: Payer: Self-pay

## 2020-12-22 ENCOUNTER — Other Ambulatory Visit: Payer: Self-pay

## 2020-12-22 ENCOUNTER — Ambulatory Visit (INDEPENDENT_AMBULATORY_CARE_PROVIDER_SITE_OTHER): Payer: 59

## 2020-12-22 ENCOUNTER — Other Ambulatory Visit (HOSPITAL_COMMUNITY)
Admission: RE | Admit: 2020-12-22 | Discharge: 2020-12-22 | Disposition: A | Discharge status: Home / Self Care | Payer: 59 | Source: Ambulatory Visit | Attending: Obstetrics | Admitting: Obstetrics

## 2020-12-22 DIAGNOSIS — N898 Other specified noninflammatory disorders of vagina: Secondary | ICD-10-CM

## 2020-12-22 NOTE — Progress Notes (Signed)
Pt is in the office for self swab. Pt reports vaginal irritation and green discharge. Next appt 12/28/20

## 2020-12-22 NOTE — Telephone Encounter (Signed)
Returned call, pt advised that she is having vaginal irritation and green discharge. Reports that she cannot take any time off of work for appt and may go to [facility] after work. Advised pt to come into office now for self swab, pt agreed.

## 2020-12-23 ENCOUNTER — Other Ambulatory Visit: Payer: Self-pay | Admitting: Obstetrics

## 2020-12-23 DIAGNOSIS — B3731 Acute candidiasis of vulva and vagina: Secondary | ICD-10-CM

## 2020-12-23 DIAGNOSIS — B373 Candidiasis of vulva and vagina: Secondary | ICD-10-CM

## 2020-12-23 LAB — CERVICOVAGINAL ANCILLARY ONLY
Bacterial Vaginitis (gardnerella): NEGATIVE
Candida Glabrata: NEGATIVE
Candida Vaginitis: POSITIVE — AB
Chlamydia: NEGATIVE
Comment: NEGATIVE
Comment: NEGATIVE
Comment: NEGATIVE
Comment: NEGATIVE
Comment: NEGATIVE
Comment: NORMAL
Neisseria Gonorrhea: NEGATIVE
Trichomonas: NEGATIVE

## 2020-12-23 MED ORDER — TERCONAZOLE 0.4 % VA CREA: 1.0000 | TOPICAL_CREAM | Freq: Every day | VAGINAL | 0 refills | Status: DC

## 2020-12-23 NOTE — Progress Notes (Signed)
I reviewed the nurses note and agree with the plan of care.   Melonee Gerstel A, MD 01/31/2018 10:46 AM  

## 2020-12-27 NOTE — Progress Notes (Signed)
Subjective:  Ebony Snyder is a 25 y.o. G1P0 at [redacted]w[redacted]d being seen today for ongoing prenatal care.  She is currently monitored for the following issues for this low-risk pregnancy and has Supervision of other normal pregnancy, antepartum; Headache in pregnancy, antepartum; Abnormal antibody titer; Pelvic pressure in pregnancy; Genetic carrier; Gestational diabetes; and Anemia in pregnancy on their problem list.  Patient reports no complaints.  Contractions: Not present. Vag. Bleeding: None.  Movement: Present. Denies leaking of fluid.   The following portions of the patient's history were reviewed and updated as appropriate: allergies, current medications, past family history, past medical history, past social history, past surgical history and problem list. Problem list updated.  Objective:   Vitals:   12/28/20 0907  BP: 134/81  Pulse: 89  Weight: 188 lb (85.3 kg)    Fetal Status: Fetal Heart Rate (bpm): 140 Fundal Height: 30 cm Movement: Present     General:  Alert, oriented and cooperative. Patient is in no acute distress.  Skin: Skin is warm and dry. No rash noted.   Cardiovascular: Normal heart rate noted  Respiratory: Normal respiratory effort, no problems with respiration noted  Abdomen: Soft, gravid, appropriate for gestational age. Pain/Pressure: Absent     Pelvic: Vag. Bleeding: None     Cervical exam deferred        Extremities: Normal range of motion.     Mental Status: Normal mood and affect. Normal behavior. Normal judgment and thought content.   Urinalysis:      Assessment and Plan:  Pregnancy: G1P0 at [redacted]w[redacted]d  1. Supervision of other normal pregnancy, antepartum -watch BP -baseline pre-E labs drawn today -cuff checked for fit today, running a few points higher than office cuff, pt aware if elevated at home may need to come to office after virtual visit for repeat BP check  2. Abnormal antibody titer Anti-Lewis Antibody Anti Lewis a is of the IgM class and will  not cross the placenta. It has not been reported as a cause of HDN. Titer not indicated.  3. 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 Gestational diabetes mellitus (GDM) in third trimester, gestational diabetes method of control unspecified -has meeting with DM educator 01/11/2021 - briefly reviewed diet plan, blood glucose goals, checking 4 times daily as patient has supplies -growth [place] ordered, plan for BPP pending glucose numbers -pt to send [place] blood sugar numbers in one week, prior to meeting with GDM educator  5. Anemia during pregnancy in third trimester -on oral iron QOD  6. [redacted] weeks gestation of pregnancy  Preterm labor symptoms and general obstetric precautions including but not limited to vaginal bleeding, contractions, leaking of fluid and fetal movement were reviewed in detail with the patient. I discussed the assessment and treatment plan with the patient. The patient was provided an opportunity to ask questions and all were answered. The patient agreed with the plan and demonstrated an understanding of the instructions. The patient was advised to call back or seek an in-person office evaluation/go to [facility] at [facility] for any urgent or concerning symptoms. Please refer to After Visit Summary for other counseling recommendations.  Return in about 2 weeks (around 01/11/2021) for virtual HOB/APP OK, needs growth [place] ASAP.   Keyasha Miah, Odie Sera, NP

## 2020-12-28 ENCOUNTER — Other Ambulatory Visit: Payer: Self-pay

## 2020-12-28 ENCOUNTER — Ambulatory Visit (INDEPENDENT_AMBULATORY_CARE_PROVIDER_SITE_OTHER): Payer: 59 | Admitting: Women's Health

## 2020-12-28 VITALS — BP 134/81 | HR 89 | Wt 188.0 lb

## 2020-12-28 DIAGNOSIS — O99013 Anemia complicating pregnancy, third trimester: Secondary | ICD-10-CM

## 2020-12-28 DIAGNOSIS — O24419 Gestational diabetes mellitus in pregnancy, unspecified control: Secondary | ICD-10-CM

## 2020-12-28 DIAGNOSIS — R760 Raised antibody titer: Secondary | ICD-10-CM

## 2020-12-28 DIAGNOSIS — R030 Elevated blood-pressure reading, without diagnosis of hypertension: Secondary | ICD-10-CM

## 2020-12-28 DIAGNOSIS — Z148 Genetic carrier of other disease: Secondary | ICD-10-CM

## 2020-12-28 DIAGNOSIS — Z3480 Encounter for supervision of other normal pregnancy, unspecified trimester: Secondary | ICD-10-CM

## 2020-12-28 DIAGNOSIS — Z3A29 29 weeks gestation of pregnancy: Secondary | ICD-10-CM

## 2020-12-28 NOTE — Patient Instructions (Addendum)
Maternity Assessment Unit (MAU)  The Maternity Assessment Unit (MAU) is located at the [facility] and [place] [facility]. The address is: [address], [place], [place], [place] [address]. Please see map below for additional directions.    The Maternity Assessment Unit is designed to help you during your pregnancy, and for up to 6 weeks after delivery, with any pregnancy- or postpartum-related emergencies, if you think you are in labor, or if your water has broken. For example, if you experience nausea and vomiting, vaginal bleeding, severe abdominal or pelvic pain, elevated blood pressure or other problems related to your pregnancy or postpartum time, please come to the Maternity Assessment Unit for assistance.        Preterm Labor The normal length of a pregnancy is 39-41 weeks. Preterm labor is when labor starts before 37 completed weeks of pregnancy. Babies who are born prematurely and survive may not be fully developed and may be at an increased risk for long-term problems such as cerebral palsy, developmental delays, and vision and hearing problems. Babies who are born too early may have problems soon after birth. Problems may include regulating blood sugar, body temperature, heart rate, and breathing rate. These babies often have trouble with feeding. The risk of having problems is highest for babies who are born before 44 weeks of pregnancy. What are the causes? The exact cause of this condition is not known. What increases the risk? You are more likely to have preterm labor if you have certain risk factors that relate to your medical history, problems with present and past pregnancies, and lifestyle factors. Medical history  You have abnormalities of the uterus, including a short cervix.  You have STIs (sexually transmitted infections), or other infections of the urinary tract and the vagina.  You have chronic illnesses, such as blood clotting problems,  diabetes, or high blood pressure.  You are overweight or underweight. Present and past pregnancies  You have had preterm labor before.  You are pregnant with twins or other multiples.  You have been diagnosed with a condition in which the placenta covers your cervix (placenta previa).  You waited less than 6 months between giving birth and becoming pregnant again.  Your unborn baby has some abnormalities.  You have vaginal bleeding during pregnancy.  You became pregnant through in vitro fertilization (IVF). Lifestyle and environmental factors  You use tobacco products.  You drink alcohol.  You use street drugs.  You have stress and no social support.  You experience domestic violence.  You are exposed to certain chemicals or environmental pollutants. Other factors  You are younger than age 110 or older than age 19. What are the signs or symptoms? Symptoms of this condition include:  Cramps similar to those that can happen during a menstrual period. The cramps may happen with diarrhea.  Pain in the abdomen or lower back.  Regular contractions that may feel like tightening of the abdomen.  A feeling of increased pressure in the pelvis.  Increased watery or bloody mucus discharge from the vagina.  Water breaking (ruptured amniotic sac). How is this diagnosed? This condition is diagnosed based on:  Your medical history and a physical exam.  A pelvic exam.  An ultrasound.  Monitoring your uterus for contractions.  Other tests, including: ? A swab of the cervix to check for a chemical called fetal fibronectin. ? Urine tests. How is this treated? Treatment for this condition depends on the length of your pregnancy, your  condition, and the health of your baby. Treatment may include:  Taking medicines, such as: ? Hormone medicines. These may be given early in pregnancy to help support the pregnancy. ? Medicines to stop contractions. ? Medicines to help mature  the baby's lungs. These may be prescribed if the risk of delivery is high. ? Medicines to prevent your baby from developing cerebral palsy.  Bed rest. If the labor happens before 34 weeks of pregnancy, you may need to stay in the hospital.  Delivery of the baby. Follow these instructions at home:  Do not use any products that contain nicotine or tobacco, such as cigarettes, e-cigarettes, and chewing tobacco. If you need help quitting, ask your health care provider.  Do not drink alcohol.  Take over-the-counter and prescription medicines only as told by your health care provider.  Rest as told by your health care provider.  Return to your normal activities as told by your health care provider. Ask your health care provider what activities are safe for you.  Keep all follow-up visits as told by your health care provider. This is important.   How is this prevented? To increase your chance of having a full-term pregnancy:  Do not use street drugs or medicines that have not been prescribed to you during your pregnancy.  Talk with your health care provider before taking any herbal supplements, even if you have been taking them regularly.  Make sure you gain a healthy amount of weight during your pregnancy.  Watch for infection. If you think that you might have an infection, get it checked right away. Symptoms of infection may include: ? Fever. ? Abnormal vaginal discharge or discharge that smells bad. ? Pain or burning with urination. ? Needing to urinate urgently. ? Frequently urinating or passing small amounts of urine frequently. ? Blood in your urine. ? Urine that smells bad or unusual.  Tell your health care provider if you have had preterm labor before. Contact a health care provider if:  You think you are going into preterm labor.  You have signs or symptoms of preterm labor.  You have symptoms of infection. Get help right away if:  You are having regular, painful  contractions every 5 minutes or less.  Your water breaks. Summary  Preterm labor is labor that starts before you reach 37 weeks of pregnancy.  Delivering your baby early increases your baby's risk of developing lifelong problems.  The exact cause of preterm labor is unknown. However, having an abnormal uterus, an STI (sexually transmitted infection), or vaginal bleeding during pregnancy increases your risk for preterm labor.  Keep all follow-up visits as told by your health care provider. This is important.  Contact a health care provider if you have signs or symptoms of preterm labor. This information is not intended to replace advice given to you by your health care provider. Make sure you discuss any questions you have with your health care provider. Document Revised: 11/24/2019 Document Reviewed: 11/24/2019 Elsevier Patient Education  2021 [employer].        Gestational Diabetes Mellitus, Self-Care Caring for yourself after a diagnosis of gestational diabetes mellitus means keeping your blood sugar under control. This can be done through nutrition, exercise, lifestyle changes, insulin and other medicines, and support from your health care team. Your health care provider will set individualized treatment goals for you. What are the risks? If left untreated, gestational diabetes can cause problems for mother and baby. For the mother Women who get gestational  diabetes are more likely to:  Have labor induced and deliver early.  Have problems during labor and delivery, if the baby is larger than normal. This includes difficult labor and damage to the birth canal.  Have a cesarean delivery.  Have problems with blood pressure, including high blood pressure and preeclampsia.  Get it again if they become pregnant.  Develop type 2 diabetes in the future. For the baby Gestational diabetes that is not treated can cause the baby to have:  Low blood glucose  (hypoglycemia).  Larger-than-normal body size (macrosomia).  Breathing problems. How to monitor blood glucose Check your blood glucose every day and as often as told by your health care provider. To do this: 1. Wash your hands with soap and water for at least 20 seconds. 2. Prick the side of your finger (not the tip) with the lancet. Use a different finger each time. 3. Gently rub the finger until a small drop of blood appears. 4. Follow instructions that come with your meter for inserting the test strip, applying blood to the strip, and getting the result. 5. Write down your result and any notes. Blood glucose goals are:  95 mg/dL (5.3 mmol/L) when fasting.  140 mg/dL (7.8 mmol/L) 1 hour after a meal.  120 mg/dL (6.7 mmol/L) 2 hours after a meal.   Follow these instructions at home: Medicines  Take over-the-counter and prescription medicines only as told by your health care provider.  If your health care provider prescribed insulin or other diabetes medicines: ? Take them every day. ? Do not run out of insulin or other medicines. Plan ahead so you always have them available. Eating and drinking  Follow instructions from your health care provider about eating or drinking restrictions.  See a diet and nutrition expert (registered dietician) to help you create an eating plan that helps control your diabetes. The foods in this plan will include: ? Lean proteins. ? Complex carbohydrates. These are carbohydrates that contain fiber, have a lot of nutrients, and are digested slowly. They include dried beans, nuts, and whole grain breads, cereals, or pasta. ? Fresh fruits and vegetables. ? Low-fat dairy products. ? Healthy fats.  Eat healthy snacks between nutritious meals.  Drink enough fluid to keep your urine pale yellow.  Keep a record of the carbohydrates that you eat. To do this: ? Read food labels. ? Learn the standard serving sizes of foods.  Make a sick day plan with  your health care provider before you get sick. Follow this plan whenever you cannot eat or drink as usual.   Activity  Do exercises as told by your health care provider.  Do 30 or more minutes of physical activity a day, or as much physical activity as your health care provider recommends. It may help to control blood glucose levels after a meal if you: ? Do 10 minutes of exercise after each meal. ? Start this exercise 30 minutes after the meal.  If you start a new exercise or activity, work with your health care provider to adjust your insulin, other medicines, or food as needed. Lifestyle  Do not drink alcohol.  Do not use any products that contain nicotine or tobacco, such as cigarettes, e-cigarettes, and chewing tobacco. If you need help quitting, ask your health care provider.  Learn to manage stress. If you need help with this, ask your health care provider. Body care  Keep your vaccines up to date.  Practice good oral hygiene. To do this: ?  Clean your teeth and gums two times a day. ? Floss one or more times a day. ? Visit your dentist one or more times every 6 months.  Stay at a healthy weight while you are pregnant. Your expected weight gain depends on your BMI (body mass index) before pregnancy. General instructions  Talk with your health care provider about the risk for high blood pressure during pregnancy (preeclampsia and eclampsia).  Share your diabetes management plan with people in your workplace, school, and household.  Check your urine for ketones when sick and as told by your health care provider. Ketones are made by the liver when a lack of glucose forces the body to use fat for energy.  Carry a medical alert card or wear medical alert jewelry that says you have gestational diabetes.  Keep all follow-up visits. This is important. Get care after delivery  Have your blood glucose level checked with an oral glucose tolerance test (OGTT) 4-12 weeks after  delivery.  Get screened for diabetes at least every 3 years, or as often as told by your health care provider. Where to find more information  American Diabetes Association (ADA): diabetes.org  Association of Diabetes Care & Education Specialists (ADCES): diabeteseducator.org  Centers for Disease Control and Prevention (CDC): TonerPromos.no  American Pregnancy Association: americanpregnancy.org  U.S. Department of Agriculture MyPlate: WrestlingReporter.dk Contact a health care provider if:  Your blood glucose is above your target for two tests in a row.  You have a fever.  You have been sick for 2 days or more and are not getting better.  You have either of these problems for more than 6 hours: ? Vomiting every time you eat or drink. ? Diarrhea. Get help right away if you:  Become confused or cannot think clearly.  Have trouble breathing.  Have moderate or high ketones in your urine.  Feel your baby is not moving as usual.  Develop unusual discharge or bleeding from your vagina.  Start having early (premature) contractions. Contractions may feel like a tightening in your lower abdomen  Have a severe headache. These symptoms may represent a serious problem that is an emergency. Do not wait to see if the symptoms will go away. Get medical help right away. Call your local emergency services (911 in the [place] Do not drive yourself to the hospital. Summary  Check your blood glucose every day during your pregnancy. Do this as often as told by your health care provider.  Take insulin or other diabetes medicines every day, if your health care provider prescribed them.  Have your blood glucose level checked 4-12 weeks after delivery.  Keep all follow-up visits. This is important. This information is not intended to replace advice given to you by your health care provider. Make sure you discuss any questions you have with your health care provider. Document Revised: 03/28/2020 Document  Reviewed: 03/28/2020 Elsevier Patient Education  2021 Elsevier Inc.        Preeclampsia and Eclampsia Preeclampsia is a serious condition that may develop during pregnancy. This condition involves high blood pressure during pregnancy and causes symptoms such as headaches, vision changes, and increased swelling in the legs, hands, and face. Preeclampsia occurs after 20 weeks of pregnancy. Eclampsia is a seizure that happens from worsening preeclampsia. Diagnosing and managing preeclampsia early is important. If not treated early, it can cause serious problems for mother and baby. There is no cure for this condition. However, during pregnancy, delivering the baby may be the best treatment  for preeclampsia or eclampsia. For most women, symptoms of preeclampsia and eclampsia go away after giving birth. In rare cases, a woman may develop preeclampsia or eclampsia after giving birth. This usually occurs within 48 hours after childbirth but may occur up to 6 weeks after giving birth. What are the causes? The cause of this condition is not known. What increases the risk? The following factors make you more likely to develop preeclampsia:  Being pregnant for the first time or being pregnant with multiples.  Having had preeclampsia or a condition called hemolysis, elevated liver enzymes, and low platelet count (HELLP)syndrome during a past pregnancy.  Having a family history of preeclampsia.  Being older than age 30.  Being obese.  Becoming pregnant through fertility treatments. Conditions that reduce blood flow or oxygen to your placenta and baby may also increase your risk. These include:  High blood pressure before, during, or immediately following pregnancy.  Kidney disease.  Diabetes.  Blood clotting disorders.  Autoimmune diseases, such as lupus.  Sleep apnea. What are the signs or symptoms? Common symptoms of this condition include:  A severe, throbbing headache that does  not go away.  Vision problems, such as blurred or double vision and light sensitivity.  Pain in the stomach, especially the right upper region.  Pain in the shoulder. Other symptoms that may develop as the condition gets worse include:  Sudden weight gain because of fluid buildup in the body. This causes swelling of the face, hands, legs, and feet.  Severe nausea and vomiting.  Urinating less than usual.  Shortness of breath.  Seizures. How is this diagnosed? Your health care provider will ask you about symptoms and check for signs of preeclampsia during your prenatal visits. You will also have routine tests, including:  Checking your blood pressure.  Urine tests to check for protein.  Blood tests to assess your organ function.  Monitoring your baby's heart rate.  Ultrasounds to check fetal growth.   How is this treated? You and your health care provider will determine the treatment that is best for you. Treatment may include:  Frequent prenatal visits to check for preeclampsia.  Medicine to lower your blood pressure.  Medicine to prevent seizures.  Low-dose aspirin during your pregnancy.  Staying in the hospital, in severe cases. You will be given medicines to control your blood pressure and the amount of fluids in your body.  Delivering your baby. Work with your health care provider to manage any chronic health conditions, such as diabetes or kidney problems. Also, work with your health care provider to manage weight gain during pregnancy. Follow these instructions at home: Eating and drinking  Drink enough fluid to keep your urine pale yellow.  Avoid caffeine. Caffeine may increase blood pressure and heart rate and lead to dehydration.  Reduce the amount of salt that you eat. Lifestyle  Do not use any products that contain nicotine or tobacco. These products include cigarettes, chewing tobacco, and vaping devices, such as e-cigarettes. If you need help  quitting, ask your health care provider.  Do not use alcohol or drugs.  Avoid stress as much as possible.  Rest and get plenty of sleep. General instructions  Take over-the-counter and prescription medicines only as told by your health care provider.  When lying down, lie on your left side. This keeps pressure off your major blood vessels.  When sitting or lying down, raise (elevate) your feet. Try putting pillows underneath your lower legs.  Exercise regularly. Ask your  health care provider what kinds of exercise are best for you.  Check your blood pressure as often as recommended by your health care provider.  Keep all prenatal and follow-up visits. This is important.   Contact a health care provider if:  You have symptoms that may need treatment or closer monitoring. These include: ? Headaches. ? Stomach pain or nausea and vomiting. ? Shoulder pain. ? Vision problems, such as spots in front of your eyes or blurry vision. ? Sudden weight gain or increased swelling in your face, hands, legs, and feet. ? Increased anxiety or feeling of impending doom. ? Signs or symptoms of labor. Get help right away if:  You have any of the following symptoms: ? A seizure. ? Shortness of breath or trouble breathing. ? Trouble speaking or slurred speech. ? Fainting. ? Chest pain. These symptoms may represent a serious problem that is an emergency. Do not wait to see if the symptoms will go away. Get medical help right away. Call your local emergency services (911 in the [place] Do not drive yourself to the hospital. Summary  Preeclampsia is a serious condition that may develop during pregnancy.  Diagnosing and treating preeclampsia early is very important.  Keep all prenatal and follow-up visits. This is important.  Get help right away if you have a seizure, shortness of breath or trouble breathing, trouble speaking or slurred speech, chest pain, or fainting. This information is not  intended to replace advice given to you by your health care provider. Make sure you discuss any questions you have with your health care provider. Document Revised: 07/14/2020 Document Reviewed: 07/14/2020 Elsevier Patient Education  2021 [employer].

## 2020-12-28 NOTE — Progress Notes (Signed)
Home cuff in office today reads:  141/79

## 2020-12-29 LAB — COMPREHENSIVE METABOLIC PANEL
ALT: 18 IU/L (ref 0–32)
AST: 21 IU/L (ref 0–40)
Albumin/Globulin Ratio: 1.4 (ref 1.2–2.2)
Albumin: 3.5 g/dL — ABNORMAL LOW (ref 3.9–5.0)
Alkaline Phosphatase: 71 IU/L (ref 44–121)
BUN/Creatinine Ratio: 5 — ABNORMAL LOW (ref 9–23)
BUN: 3 mg/dL — ABNORMAL LOW (ref 6–20)
Bilirubin Total: 0.3 mg/dL (ref 0.0–1.2)
CO2: 18 mmol/L — ABNORMAL LOW (ref 20–29)
Calcium: 9.3 mg/dL (ref 8.7–10.2)
Chloride: 105 mmol/L (ref 96–106)
Creatinine, Ser: 0.55 mg/dL — ABNORMAL LOW (ref 0.57–1.00)
GFR calc Af Amer: 151 mL/min/{1.73_m2} (ref >59)
GFR calc non Af Amer: 131 mL/min/{1.73_m2} (ref >59)
Globulin, Total: 2.5 g/dL (ref 1.5–4.5)
Glucose: 94 mg/dL (ref 65–99)
Potassium: 3.9 mmol/L (ref 3.5–5.2)
Sodium: 140 mmol/L (ref 134–144)
Total Protein: 6 g/dL (ref 6.0–8.5)

## 2020-12-29 LAB — PROTEIN / CREATININE RATIO, URINE
Creatinine, Urine: 36.9 mg/dL
Protein, Ur: 6.5 mg/dL
Protein/Creat Ratio: 176 mg/g creat (ref 0–200)

## 2021-01-03 ENCOUNTER — Other Ambulatory Visit: Payer: Self-pay

## 2021-01-03 ENCOUNTER — Telehealth: Payer: Self-pay

## 2021-01-03 DIAGNOSIS — O24419 Gestational diabetes mellitus in pregnancy, unspecified control: Secondary | ICD-10-CM

## 2021-01-03 MED ORDER — ACCU-CHEK GUIDE VI STRP: 1.0000 | ORAL_STRIP | Freq: Four times a day (QID) | 12 refills | Status: DC

## 2021-01-03 NOTE — Telephone Encounter (Signed)
Sam from [facility] calling to report elevated BP reading of 147/88 that patient logged today.   I called patient to discuss elevated reading. Patient states that she took bp about 15 minutes ago and she got that reading. She states that she was seeing spots around 11 am this morning but did not think anything about it until she started reading the 30 week update in baby scripts and notice the signs and symptoms of complication during pregnancy. This prompted her to take her bp.   Patient has been sitting down for at least 15-20 minutes during time of call. Patient instructed to re-check her BP. This reading was 140/75.  Patient states that she has a slight headache, but denies swelling or any epigastric pain. She has not taken anything for headache.  Per Dr. Debroah Loop have patient come into the office for BP check tomorrow. Patient schedule for nurse visit for 3/2.  Patient advised to continue to monitor bp readings throughout the evening, and if she starts getting readings over 150-160s over 90s to go to MAU along with worsening headache and visual changes. Patient verbalized understanding.

## 2021-01-04 ENCOUNTER — Ambulatory Visit: Payer: 59 | Admitting: *Deleted

## 2021-01-04 ENCOUNTER — Other Ambulatory Visit: Payer: Self-pay

## 2021-01-04 DIAGNOSIS — Z3480 Encounter for supervision of other normal pregnancy, unspecified trimester: Secondary | ICD-10-CM

## 2021-01-04 NOTE — Progress Notes (Signed)
Pt is in office today for BP check after having increase reading at home yesterday. BP in office today is well controlled, pt is having slight HA.  Pt states she has not taken anything for HA, denies any visual changes/swelling/pain.   BP 114/72   Pulse 83   LMP 06/06/2020   Pt advised to continue to monitor BP as home as directed.  Pt may take Tylenol as needed for HA, increase PO fluids, and monitor for any other symptoms. Pt made aware if she develops or has worsening symptoms, she may contact office or be seen at hospital.   Pt states understanding.

## 2021-01-04 NOTE — Progress Notes (Signed)
Agree with A & P. 

## 2021-01-06 ENCOUNTER — Other Ambulatory Visit: Payer: Self-pay

## 2021-01-06 ENCOUNTER — Ambulatory Visit: Payer: 59 | Attending: Obstetrics and Gynecology

## 2021-01-06 ENCOUNTER — Ambulatory Visit: Payer: 59 | Admitting: *Deleted

## 2021-01-06 ENCOUNTER — Encounter: Payer: Self-pay | Admitting: *Deleted

## 2021-01-06 DIAGNOSIS — O3663X0 Maternal care for excessive fetal growth, third trimester, not applicable or unspecified: Secondary | ICD-10-CM | POA: Insufficient documentation

## 2021-01-06 DIAGNOSIS — Z148 Genetic carrier of other disease: Secondary | ICD-10-CM

## 2021-01-06 DIAGNOSIS — O358XX0 Maternal care for other (suspected) fetal abnormality and damage, not applicable or unspecified: Secondary | ICD-10-CM

## 2021-01-06 DIAGNOSIS — O99213 Obesity complicating pregnancy, third trimester: Secondary | ICD-10-CM | POA: Diagnosis not present

## 2021-01-06 DIAGNOSIS — Z3480 Encounter for supervision of other normal pregnancy, unspecified trimester: Secondary | ICD-10-CM | POA: Diagnosis present

## 2021-01-06 DIAGNOSIS — O24419 Gestational diabetes mellitus in pregnancy, unspecified control: Secondary | ICD-10-CM | POA: Diagnosis not present

## 2021-01-06 DIAGNOSIS — Z3A30 30 weeks gestation of pregnancy: Secondary | ICD-10-CM | POA: Diagnosis not present

## 2021-01-06 DIAGNOSIS — E669 Obesity, unspecified: Secondary | ICD-10-CM

## 2021-01-06 DIAGNOSIS — O24410 Gestational diabetes mellitus in pregnancy, diet controlled: Secondary | ICD-10-CM

## 2021-01-07 DIAGNOSIS — O3660X0 Maternal care for excessive fetal growth, unspecified trimester, not applicable or unspecified: Secondary | ICD-10-CM | POA: Insufficient documentation

## 2021-01-09 ENCOUNTER — Other Ambulatory Visit: Payer: Self-pay | Admitting: *Deleted

## 2021-01-09 DIAGNOSIS — O24410 Gestational diabetes mellitus in pregnancy, diet controlled: Secondary | ICD-10-CM

## 2021-01-10 ENCOUNTER — Telehealth: Payer: Self-pay

## 2021-01-10 NOTE — Telephone Encounter (Signed)
TC to pt regarding [facility] F/U on elevated B/P  No answer LVM.

## 2021-01-11 ENCOUNTER — Encounter: Payer: Self-pay | Admitting: Skilled Nursing Facility1

## 2021-01-11 ENCOUNTER — Other Ambulatory Visit: Payer: Self-pay

## 2021-01-11 ENCOUNTER — Encounter: Payer: 59 | Attending: Women's Health | Admitting: Skilled Nursing Facility1

## 2021-01-11 ENCOUNTER — Ambulatory Visit (INDEPENDENT_AMBULATORY_CARE_PROVIDER_SITE_OTHER): Payer: 59 | Admitting: Obstetrics and Gynecology

## 2021-01-11 VITALS — BP 135/85 | HR 103 | Wt 188.9 lb

## 2021-01-11 DIAGNOSIS — O3663X0 Maternal care for excessive fetal growth, third trimester, not applicable or unspecified: Secondary | ICD-10-CM

## 2021-01-11 DIAGNOSIS — O24410 Gestational diabetes mellitus in pregnancy, diet controlled: Secondary | ICD-10-CM | POA: Insufficient documentation

## 2021-01-11 DIAGNOSIS — R760 Raised antibody titer: Secondary | ICD-10-CM

## 2021-01-11 DIAGNOSIS — Z3A31 31 weeks gestation of pregnancy: Secondary | ICD-10-CM | POA: Insufficient documentation

## 2021-01-11 DIAGNOSIS — Z3480 Encounter for supervision of other normal pregnancy, unspecified trimester: Secondary | ICD-10-CM

## 2021-01-11 DIAGNOSIS — O99013 Anemia complicating pregnancy, third trimester: Secondary | ICD-10-CM

## 2021-01-11 DIAGNOSIS — Z148 Genetic carrier of other disease: Secondary | ICD-10-CM

## 2021-01-11 NOTE — Patient Instructions (Addendum)
Gestational Diabetes Mellitus, Self-Care When you have gestational diabetes mellitus, you must make sure your blood sugar (glucose) stays at a healthy level. What are the risks? If you do not get treated for this condition, it may cause problems for you and your unborn baby. For the mother  Giving birth to the baby early.  Having problems during labor and when giving birth.  Needing surgery to give birth to the baby (cesarean delivery).  Having problems with blood pressure.  Getting this form of diabetes again when pregnant.  Getting type 2 diabetes in the future. For the baby  Low blood sugar.  Bigger body size than is normal.  Breathing problems. How to monitor blood sugar Check your blood sugar every day while you are pregnant. Check it as often as told by your doctor. To do this: 1. Wash your hands with soap and water for at least 20 seconds. 2. Prick the side of your finger (not the tip) with the lancet. Use a different finger each time. 3. Gently rub the finger until a small drop of blood appears. 4. Follow instructions that come with your meter for: ? Putting in the test strip. ? Putting blood on the strip. ? Getting the result. 5. Write down your result and any notes. In general, your blood sugar levels should be:  95 mg/dL (5.3 mmol/L) if you have not eaten.  140 mg/dL (7.8 mmol/L) 1 hour after a meal.  120 mg/dL (6.7 mmol/L) 2 hours after a meal.   Follow these instructions at home: Medicines  Take over-the-counter and prescription medicines only as told by your doctor.  If your doctor prescribed insulin or other diabetes medicines: ? Take them every day. ? Do not run out of insulin or other medicines. Plan ahead so you always have them. Eating and drinking  Follow instructions from your doctor about eating or drinking restrictions.  See a food expert (dietician) to help you create an eating plan that helps control your blood sugar. The foods in this  plan will include: ? Low-fat proteins. ? Dried beans, nuts, and whole grain breads, cereals, or pasta. ? Fresh fruits and vegetables. ? Low-fat dairy products. ? Healthy fats.  Eat healthy snacks between healthy meals.  Drink enough fluid to keep your pee (urine) pale yellow.  Keep track of carbs that you eat. To do this: ? Read food labels. ? Learn the serving sizes of foods.  Follow your sick day plan when you cannot eat or drink normally. Make this plan with your doctor so it is ready to use.   Activity  Do exercises as told by your doctor.  Exercise for 30 or more minutes a day, or as much as your doctor recommends. To help you control blood sugar levels after a meal: ? Do 10 minutes of exercise after each meal. ? Start this exercise 30 minutes after the meal.  Talk with your doctor before you start a new exercise. Your doctor may tell you to change your insulin, other medicines, or food. Lifestyle  Do not drink alcohol.  Do not use any products that contain nicotine or tobacco, such as cigarettes, e-cigarettes, and chewing tobacco. If you need help quitting, ask your doctor.  Learn how to deal with stress. If you need help with this, ask your doctor. Body care  Stay up to date with your shots (vaccines).  Take good care of your teeth. To do this: ? Brush your teeth and gums two times a day. ?   Floss one or more times a day. ? Go to the dentist one or more times every 6 months.  Stay at a healthy weight while you are pregnant. General instructions  Ask your doctor about risks of high blood pressure in pregnancy.  Share your diabetes care plan with: ? Your work or school. ? People you live with.  Check your pee for ketones: ? When you are sick. ? As told by your doctor.  Carry a card or wear a bracelet that says you have diabetes.  Keep all follow-up visits. Care after giving birth  Have your blood sugar checked 4-12 weeks after you give birth.  Get  checked for diabetes one or more times every 3 years or as told. Where to find more information  American Diabetes Association (ADA): diabetes.org  Association of Diabetes Care & Education Specialists (ADCES): diabeteseducator.org  Centers for Disease Control and Prevention (CDC): TonerPromos.no  American Pregnancy Association: americanpregnancy.org  [place] Department of Agriculture MyPlate: WrestlingReporter.dk Contact a doctor if:  Your blood sugar is above your target for two tests in a row.  You have a fever.  You are sick for 2 days or more and do not get better.  You have either of these problems for more than 6 hours: ? You vomit every time you eat or drink. ? You have watery poop (diarrhea). Get help right away if:  You cannot think clearly.  You have trouble breathing.  You have moderate or high ketones in your pee.  Blood or abnormal fluid starts to come out of your vagina.  You feel your baby is not moving as usual.  You start having early contractions. You may feel your belly tighten.  You have a very bad headache. These symptoms may be an emergency. Get help right away. Call your local emergency services (911 in the [place]  Do not wait to see if the symptoms will go away.  Do not drive yourself to the hospital. Summary  Check your blood sugar (glucose) while you are pregnant. Check it as often as told by your doctor.  Take your insulin and diabetes medicines as told.  Have your blood sugar checked 4-12 weeks after you give birth.  Keep all follow-up visits. This information is not intended to replace advice given to you by your health care provider. Make sure you discuss any questions you have with your health care provider. Document Revised: 03/28/2020 Document Reviewed: 03/28/2020 Elsevier Patient Education  2021 [employer] Inc.  Round Ligament Pain  The round ligament is a cord of muscle and tissue that helps support the uterus. It can become a source of pain  during pregnancy if it becomes stretched or twisted as the baby grows. The pain usually begins in the second trimester (13-28 weeks) of pregnancy, and it can come and go until the baby is delivered. It is not a serious problem, and it does not cause harm to the baby. Round ligament pain is usually a short, sharp, and pinching pain, but it can also be a dull, lingering, and aching pain. The pain is felt in the lower side of the abdomen or in the groin. It usually starts deep in the groin and moves up to the outside of the hip area. The pain may occur when you:  Suddenly change position, such as quickly going from a sitting to standing position.  Roll over in bed.  Cough or sneeze.  Do physical activity. Follow these instructions at home:  Watch your condition  for any changes.  When the pain starts, relax. Then try any of these methods to help with the pain: ? Sitting down. ? Flexing your knees up to your abdomen. ? Lying on your side with one pillow under your abdomen and another pillow between your legs. ? Sitting in a warm bath for 15-20 minutes or until the pain goes away.  Take over-the-counter and prescription medicines only as told by your health care provider.  Move slowly when you sit down or stand up.  Avoid long walks if they cause pain.  Stop or reduce your physical activities if they cause pain.  Keep all follow-up visits as told by your health care provider. This is important.   Contact a health care provider if:  Your pain does not go away with treatment.  You feel pain in your back that you did not have before.  Your medicine is not helping. Get help right away if:  You have a fever or chills.  You develop uterine contractions.  You have vaginal bleeding.  You have nausea or vomiting.  You have diarrhea.  You have pain when you urinate. Summary  Round ligament pain is felt in the lower abdomen or groin. It is usually a short, sharp, and pinching pain.  It can also be a dull, lingering, and aching pain.  This pain usually begins in the second trimester (13-28 weeks). It occurs because the uterus is stretching with the growing baby, and it is not harmful to the baby.  You may notice the pain when you suddenly change position, when you cough or sneeze, or during physical activity.  Relaxing, flexing your knees to your abdomen, lying on one side, or taking a warm bath may help to get rid of the pain.  Get help from your health care provider if the pain does not go away or if you have vaginal bleeding, nausea, vomiting, diarrhea, or painful urination. This information is not intended to replace advice given to you by your health care provider. Make sure you discuss any questions you have with your health care provider. Document Revised: 04/09/2018 Document Reviewed: 04/09/2018 Elsevier Patient Education  2021 [employer].

## 2021-01-11 NOTE — Progress Notes (Signed)
   PRENATAL VISIT NOTE  Subjective:  Ebony Snyder is a 25 y.o. G1P0 at [redacted]w[redacted]d being seen today for ongoing prenatal care.  She is currently monitored for the following issues for this high-risk pregnancy and has Supervision of other normal pregnancy, antepartum; Headache in pregnancy, antepartum; Abnormal antibody titer; Pelvic pressure in pregnancy; Genetic carrier; Gestational diabetes; Anemia in pregnancy; LGA (large for gestational age) fetus affecting management of mother; and [redacted] weeks gestation of pregnancy on their problem list.  Patient doing well with no acute concerns today. She reports no complaints.  Contractions: Not present. Vag. Bleeding: None.  Movement: Present. Denies leaking of fluid.   The following portions of the patient's history were reviewed and updated as appropriate: allergies, current medications, past family history, past medical history, past social history, past surgical history and problem list. Problem list updated.  Objective:   Vitals:   01/11/21 1118  BP: 135/85  Pulse: (!) 103  Weight: 188 lb 14.4 oz (85.7 kg)    Fetal Status: Fetal Heart Rate (bpm): 161 Fundal Height: 31 cm Movement: Present     General:  Alert, oriented and cooperative. Patient is in no acute distress.  Skin: Skin is warm and dry. No rash noted.   Cardiovascular: Normal heart rate noted  Respiratory: Normal respiratory effort, no problems with respiration noted  Abdomen: Soft, gravid, appropriate for gestational age.  Pain/Pressure: Absent     Pelvic: Cervical exam deferred        Extremities: Normal range of motion.  Edema: None  Mental Status:  Normal mood and affect. Normal behavior. Normal judgment and thought content.   Assessment and Plan:  Pregnancy: G1P0 at [redacted]w[redacted]d  1. Diet controlled gestational diabetes mellitus (GDM), antepartum Per pt she just saw diabetic counselor this AM and only had 2 outliers FBS: 95 or less PPBS 120 or less  2. Supervision of other normal  pregnancy, antepartum   3. Abnormal antibody titer Anti lewis, limited clinical issues  4. Genetic carrier   5. Anemia during pregnancy in third trimester   6. Excessive fetal growth affecting management of pregnancy in third trimester, single or unspecified fetus EFW at 98%, repeat growth 02/03/21  7. [redacted] weeks gestation of pregnancy   Preterm labor symptoms and general obstetric precautions including but not limited to vaginal bleeding, contractions, leaking of fluid and fetal movement were reviewed in detail with the patient.  Please refer to After Visit Summary for other counseling recommendations.   Return in about 2 weeks (around 01/25/2021) for [facility], in person.   Mariel Aloe, MD Faculty Attending Center for [facility]

## 2021-01-11 NOTE — Progress Notes (Signed)
+   Fetal movement. No complaints.  

## 2021-01-11 NOTE — Progress Notes (Signed)
Patient was seen on 01/11/2021 for Gestational Diabetes self-management.  Pt states she has been taking her prenatal and an iron supplement daily.  Pt states she is about [redacted] weeks along with this being her first pregnancy.   Pt states she works 11-7pm, wakes 7am. Pt states she works from home. Pt states she gets sick if she eats after 6-7pm stating that is getting better.    24 hr Recall:  First Meal 10: 2 eggs + 2 bacon or sausage or omelet + onion + pepper Snack: Second meal 2-3pm: spaghetti + salad Snack: keto ice cream Third meal: spaghetti + salad Snack:  Beverages: water, gotorade zero, diet soda, juice   States the definition of Gestational Diabetes  States why dietary management is important in controlling blood glucose  Describes the effects each nutrient has on blood glucose levels  Demonstrates ability to create a balanced meal plan  Demonstrates carbohydrate counting   States when to check blood glucose levels  Demonstrates proper blood glucose monitoring techniques  States the effect of stress and exercise on blood glucose levels  States the importance of limiting caffeine and abstaining from alcohol and smoking  Education Topics: Self monitoring hygiene, numbers to aim for, how often to check Hypoglycemia Simple carbohydrates verses complex carbohydrates  Physical activity and its effect on blood sugar  Proper Hydration Neccessaty of limiting simple carbohydrates and calorically dense foods Stress reduction worth and techniques   Goals: Avoid orange juice Try YouTube dance videos for exercise  If your blood sugars are over the goal in the morning have your smoothie later in the day be sure to have protein and vegetables in it as well  Blood glucose monitoring:  Checking 4 times a day: fastin-98, 76, 84, 8688, 89  2 hr post prandial- 94, 122, 106, 109, 99148, 145  Patient instructed to monitor glucose levels: FBS: 60 - <90 1 hour: <140 2 hour:  <120  *Patient received handouts:  Nutrition Diabetes and Pregnancy  Carbohydrate Counting List  Patient will be seen for follow-up as needed.

## 2021-01-18 ENCOUNTER — Telehealth: Payer: Self-pay

## 2021-01-18 NOTE — Telephone Encounter (Signed)
Received message from baby scripts. Patient log a high blood pressure in her app.. 149/101. I attempted to call patient, no answer or voice mail.

## 2021-01-25 ENCOUNTER — Other Ambulatory Visit: Payer: Self-pay

## 2021-01-25 ENCOUNTER — Ambulatory Visit (INDEPENDENT_AMBULATORY_CARE_PROVIDER_SITE_OTHER): Payer: 59 | Admitting: Obstetrics and Gynecology

## 2021-01-25 VITALS — BP 122/78 | HR 76 | Wt 190.4 lb

## 2021-01-25 DIAGNOSIS — Z3A33 33 weeks gestation of pregnancy: Secondary | ICD-10-CM

## 2021-01-25 DIAGNOSIS — O99013 Anemia complicating pregnancy, third trimester: Secondary | ICD-10-CM

## 2021-01-25 DIAGNOSIS — O3663X0 Maternal care for excessive fetal growth, third trimester, not applicable or unspecified: Secondary | ICD-10-CM

## 2021-01-25 DIAGNOSIS — R102 Pelvic and perineal pain: Secondary | ICD-10-CM

## 2021-01-25 DIAGNOSIS — R760 Raised antibody titer: Secondary | ICD-10-CM

## 2021-01-25 DIAGNOSIS — R519 Headache, unspecified: Secondary | ICD-10-CM

## 2021-01-25 DIAGNOSIS — O24410 Gestational diabetes mellitus in pregnancy, diet controlled: Secondary | ICD-10-CM

## 2021-01-25 DIAGNOSIS — Z3480 Encounter for supervision of other normal pregnancy, unspecified trimester: Secondary | ICD-10-CM

## 2021-01-25 DIAGNOSIS — O26899 Other specified pregnancy related conditions, unspecified trimester: Secondary | ICD-10-CM

## 2021-01-25 NOTE — Progress Notes (Signed)
Pt reports fetal movement with occasional pain and pressure. Pt reports fasting BG today was 82.

## 2021-01-25 NOTE — Progress Notes (Signed)
   PRENATAL VISIT NOTE  Subjective:  Ebony Snyder is a 25 y.o. G1P0 at [redacted]w[redacted]d being seen today for ongoing prenatal care.  She is currently monitored for the following issues for this high-risk pregnancy and has Supervision of other normal pregnancy, antepartum; Headache in pregnancy, antepartum; Abnormal antibody titer; Pelvic pressure in pregnancy; Genetic carrier; Gestational diabetes; Anemia in pregnancy; LGA (large for gestational age) fetus affecting management of mother; [redacted] weeks gestation of pregnancy; and [redacted] weeks gestation of pregnancy on their problem list.  Patient doing well with no acute concerns today. She reports no complaints. Denies leaking of fluid.   The following portions of the patient's history were reviewed and updated as appropriate: allergies, current medications, past family history, past medical history, past social history, past surgical history and problem list. Problem list updated.  Objective:   Vitals:   01/25/21 0840  BP: 122/78  Pulse: 76  Weight: 190 lb 6.4 oz (86.4 kg)    Fetal Status: Fetal Heart Rate (bpm): 140         General:  Alert, oriented and cooperative. Patient is in no acute distress.  Skin: Skin is warm and dry. No rash noted.   Cardiovascular: Normal heart rate noted  Respiratory: Normal respiratory effort, no problems with respiration noted  Abdomen: Soft, gravid, appropriate for gestational age.        Pelvic: Cervical exam deferred        Extremities: Normal range of motion.     Mental Status:  Normal mood and affect. Normal behavior. Normal judgment and thought content.   Assessment and Plan:  Pregnancy: G1P0 at [redacted]w[redacted]d  1. Supervision of other normal pregnancy, antepartum   2. Diet controlled gestational diabetes mellitus (GDM) in third trimester Pt did not bring in glucose sheet, reviewed monitor.  FBS consistently in 70s PPBS are adequate, but occasional elevations noted usually do to diet decisions.  Pt advised to make  better diet choices.  3. [redacted] weeks gestation of pregnancy   4. Abnormal antibody titer   5. Anemia during pregnancy in third trimester   6. Headache in pregnancy, antepartum   7. Excessive fetal growth affecting management of pregnancy in third trimester, single or unspecified fetus LGA fetus at 98%, growth on 4/1  8. Pelvic pressure in pregnancy   Preterm labor symptoms and general obstetric precautions including but not limited to vaginal bleeding, contractions, leaking of fluid and fetal movement were reviewed in detail with the patient.  Please refer to After Visit Summary for other counseling recommendations.   Return in about 2 weeks (around 02/08/2021) for [facility], in person.   Mariel Aloe, MD Faculty Attending Center for [facility]

## 2021-02-03 ENCOUNTER — Ambulatory Visit: Payer: 59 | Attending: Obstetrics and Gynecology

## 2021-02-03 ENCOUNTER — Ambulatory Visit: Payer: 59 | Admitting: *Deleted

## 2021-02-03 ENCOUNTER — Other Ambulatory Visit: Payer: Self-pay

## 2021-02-03 ENCOUNTER — Encounter: Payer: Self-pay | Admitting: *Deleted

## 2021-02-03 DIAGNOSIS — Z369 Encounter for antenatal screening, unspecified: Secondary | ICD-10-CM

## 2021-02-03 DIAGNOSIS — Z148 Genetic carrier of other disease: Secondary | ICD-10-CM

## 2021-02-03 DIAGNOSIS — Z3A34 34 weeks gestation of pregnancy: Secondary | ICD-10-CM | POA: Diagnosis not present

## 2021-02-03 DIAGNOSIS — O99212 Obesity complicating pregnancy, second trimester: Secondary | ICD-10-CM | POA: Diagnosis not present

## 2021-02-03 DIAGNOSIS — Z3480 Encounter for supervision of other normal pregnancy, unspecified trimester: Secondary | ICD-10-CM | POA: Insufficient documentation

## 2021-02-03 DIAGNOSIS — E669 Obesity, unspecified: Secondary | ICD-10-CM

## 2021-02-03 DIAGNOSIS — O24410 Gestational diabetes mellitus in pregnancy, diet controlled: Secondary | ICD-10-CM | POA: Insufficient documentation

## 2021-02-07 ENCOUNTER — Other Ambulatory Visit: Payer: Self-pay

## 2021-02-07 ENCOUNTER — Ambulatory Visit (INDEPENDENT_AMBULATORY_CARE_PROVIDER_SITE_OTHER): Payer: 59 | Admitting: Obstetrics and Gynecology

## 2021-02-07 VITALS — BP 127/84 | HR 90 | Wt 192.0 lb

## 2021-02-07 DIAGNOSIS — R519 Headache, unspecified: Secondary | ICD-10-CM

## 2021-02-07 DIAGNOSIS — Z3480 Encounter for supervision of other normal pregnancy, unspecified trimester: Secondary | ICD-10-CM

## 2021-02-07 DIAGNOSIS — Z148 Genetic carrier of other disease: Secondary | ICD-10-CM

## 2021-02-07 DIAGNOSIS — O26899 Other specified pregnancy related conditions, unspecified trimester: Secondary | ICD-10-CM

## 2021-02-07 DIAGNOSIS — R760 Raised antibody titer: Secondary | ICD-10-CM

## 2021-02-07 DIAGNOSIS — Z3A35 35 weeks gestation of pregnancy: Secondary | ICD-10-CM | POA: Insufficient documentation

## 2021-02-07 DIAGNOSIS — O24410 Gestational diabetes mellitus in pregnancy, diet controlled: Secondary | ICD-10-CM

## 2021-02-07 LAB — POCT URINALYSIS DIPSTICK
Bilirubin, UA: NEGATIVE
Blood, UA: NEGATIVE
Glucose, UA: NEGATIVE
Ketones, UA: NEGATIVE
Nitrite, UA: NEGATIVE
Protein, UA: NEGATIVE
Spec Grav, UA: <=1.005 — AB (ref 1.010–1.025)
Urobilinogen, UA: 0.2 E.U./dL
pH, UA: 7.5 (ref 5.0–8.0)

## 2021-02-07 MED ORDER — BUTALBITAL-APAP-CAFFEINE 50-325-40 MG PO CAPS: 1.0000 | ORAL_CAPSULE | Freq: Four times a day (QID) | ORAL | 0 refills | Status: DC | PRN

## 2021-02-07 NOTE — Progress Notes (Signed)
Pt states that she has Migraine today, has not taken anything.  Pt does not have test strips to check glucose, states not covered by insurance- may be able to get tomorrow.

## 2021-02-07 NOTE — Patient Instructions (Signed)
Group B Streptococcus Test During Pregnancy Why am I having this test? Routine testing, also called screening, for group B streptococcus (GBS) is recommended for all pregnant women between the 36th and 37th week of pregnancy. GBS is a type of bacteria that can be passed from mother to baby during childbirth. Screening will help guide whether or not you will need treatment during labor and delivery to prevent complications such as:  An infection in your uterus during labor.  An infection in your uterus after delivery.  A serious infection in your baby after delivery, such as pneumonia, meningitis, or sepsis. GBS screening is not often done before 36 weeks of pregnancy unless you go into labor prematurely. What happens if I have group B streptococcus? If testing shows that you have GBS, your health care provider will recommend treatment with IV antibiotics during labor and delivery. This treatment significantly decreases the risk of complications for you and your baby. If you have a planned C-section and you have GBS, you may not need to be treated with antibiotics because GBS is usually passed to babies after labor starts and your water breaks. If you are in labor or your water breaks before your C-section, it is possible for GBS to get into your uterus and be passed to your baby, so you might need treatment. Is there a chance I may not need to be tested? You may not need to be tested for GBS if:  You have a urine test that shows GBS before 36 to 37 weeks.  You had a baby with GBS infection after a previous delivery. In these cases, you will automatically be treated for GBS during labor and delivery. What is being tested? This test is done to check if you have group B streptococcus in your vagina or rectum. What kind of sample is taken? To collect samples for this test, your health care provider will swab your vagina and rectum with a cotton swab. The sample is then sent to the lab to see if  GBS is present. What happens during the test?  You will remove your clothing from the waist down.  You will lie down on an exam table in the same position as you would for a pelvic exam.  Your health care provider will swab your vagina and rectum to collect samples for a culture test.  You will be able to go home after the test and do all your usual activities.   How are the results reported? The test results are reported as positive or negative. What do the results mean?  A positive test means you are at risk for passing GBS to your baby during labor and delivery. Your health care provider will recommend that you are treated with an IV antibiotic during labor and delivery.  A negative test means you are at very low risk of passing GBS to your baby. There is still a low risk of passing GBS to your baby because sometimes test results may report that you do not have a condition when you do (false-negative result) or there is a chance that you may become infected with GBS after the test is done. You most likely will not need to be treated with an antibiotic during labor and delivery. Talk with your health care provider about what your results mean. Questions to ask your health care provider Ask your health care provider, or the department that is doing the test:  When will my results be ready?  How will   I get my results?  What are my treatment options? Summary  Routine testing (screening) for group B streptococcus (GBS) is recommended for all pregnant women between the 36th and 37th week of pregnancy.  GBS is a type of bacteria that can be passed from mother to baby during childbirth.  If testing shows that you have GBS, your health care provider will recommend that you are treated with IV antibiotics during labor and delivery. This treatment almost always prevents infection in newborns. This information is not intended to replace advice given to you by your health care provider. Make  sure you discuss any questions you have with your health care provider. Document Revised: 08/23/2020 Document Reviewed: 11/19/2018 Elsevier Patient Education  2021 [employer]. Third Trimester of Pregnancy  The third trimester of pregnancy is from week 28 through week 40. This is also called months 7 through 9. This trimester is when your unborn baby (fetus) is growing very fast. At the end of the ninth month, the unborn baby is about 20 inches long. It weighs about 6-10 pounds. Body changes during your third trimester Your body continues to go through many changes during this time. The changes vary and generally return to normal after the baby is born. Physical changes  Your weight will continue to increase. You may gain 25-35 pounds (11-16 kg) by the end of the pregnancy. If you are underweight, you may gain 28-40 lb (about 13-18 kg). If you are overweight, you may gain 15-25 lb (about 7-11 kg).  You may start to get stretch marks on your hips, belly (abdomen), and breasts.  Your breasts will continue to grow and may hurt. A yellow fluid (colostrum) may leak from your breasts. This is the first milk you are making for your baby.  You may have changes in your hair.  Your belly button may stick out.  You may have more swelling in your hands, face, or ankles. Health changes  You may have heartburn.  You may have trouble pooping (constipation).  You may get hemorrhoids. These are swollen veins in the butt that can itch or get painful.  You may have swollen veins (varicose veins) in your legs.  You may have more body aches in the pelvis, back, or thighs.  You may have more tingling or numbness in your hands, arms, and legs. The skin on your belly may also feel numb.  You may feel short of breath as your womb (uterus) gets bigger. Other changes  You may pee (urinate) more often.  You may have more problems sleeping.  You may notice the unborn baby "dropping," or moving lower  in your belly.  You may have more discharge coming from your vagina.  Your joints may feel loose, and you may have pain around your pelvic bone. Follow these instructions at home: Medicines  Take over-the-counter and prescription medicines only as told by your doctor. Some medicines are not safe during pregnancy.  Take a prenatal vitamin that contains at least 600 micrograms (mcg) of folic acid. Eating and drinking  Eat healthy meals that include: ? Fresh fruits and vegetables. ? Whole grains. ? Good sources of protein, such as meat, eggs, or tofu. ? Low-fat dairy products.  Avoid raw meat and unpasteurized juice, milk, and cheese. These carry germs that can harm you and your baby.  Eat 4 or 5 small meals rather than 3 large meals a day.  You may need to take these actions to prevent or treat trouble pooping: ?  Drink enough fluids to keep your pee (urine) pale yellow. ? Eat foods that are high in fiber. These include beans, whole grains, and fresh fruits and vegetables. ? Limit foods that are high in fat and sugar. These include fried or sweet foods. Activity  Exercise only as told by your doctor. Stop exercising if you start to have cramps in your womb.  Avoid heavy lifting.  Do not exercise if it is too hot or too humid, or if you are in a place of great height (high altitude).  If you choose to, you may have sex unless your doctor tells you not to. Relieving pain and discomfort  Take breaks often, and rest with your legs raised (elevated) if you have leg cramps or low back pain.  Take warm water baths (sitz baths) to soothe pain or discomfort caused by hemorrhoids. Use hemorrhoid cream if your doctor approves.  Wear a good support bra if your breasts are tender.  If you develop bulging, swollen veins in your legs: ? Wear support hose as told by your doctor. ? Raise your feet for 15 minutes, 3-4 times a day. ? Limit salt in your food. Safety  Talk to your doctor  before traveling far distances.  Do not use hot tubs, steam rooms, or saunas.  Wear your seat belt at all times when you are in a car.  Talk with your doctor if someone is hurting you or yelling at you a lot. Preparing for your baby's arrival To prepare for the arrival of your baby:  Take prenatal classes.  Visit the hospital and tour the maternity area.  Buy a rear-facing car seat. Learn how to install it in your car.  Prepare the baby's room. Take out all pillows and stuffed animals from the baby's crib. General instructions  Avoid cat litter boxes and soil used by cats. These carry germs that can cause harm to the baby and can cause a loss of your baby by miscarriage or stillbirth.  Do not douche or use tampons. Do not use scented sanitary pads.  Do not smoke or use any products that contain nicotine or tobacco. If you need help quitting, ask your doctor.  Do not drink alcohol.  Do not use herbal medicines, illegal drugs, or medicines that were not approved by your doctor. Chemicals in these products can affect your baby.  Keep all follow-up visits. This is important. Where to find more information  American Pregnancy Association: americanpregnancy.org  American College of Obstetricians and Gynecologists: www.acog.org  Office on Women's Health: womenshealth.gov/pregnancy Contact a doctor if:  You have a fever.  You have mild cramps or pressure in your lower belly.  You have a nagging pain in your belly area.  You vomit, or you have watery poop (diarrhea).  You have bad-smelling fluid coming from your vagina.  You have pain when you pee, or your pee smells bad.  You have a headache that does not go away when you take medicine.  You have changes in how you see, or you see spots in front of your eyes. Get help right away if:  Your water breaks.  You have regular contractions that are less than 5 minutes apart.  You are spotting or bleeding from your  vagina.  You have very bad belly cramps or pain.  You have trouble breathing.  You have chest pain.  You faint.  You have not felt the baby move for the amount of time told by your doctor.    You have new or increased pain, swelling, or redness in an arm or leg. Summary  The third trimester is from week 28 through week 40 (months 7 through 9). This is the time when your unborn baby is growing very fast.  During this time, your discomfort may increase as you gain weight and as your baby grows.  Get ready for your baby to arrive by taking prenatal classes, buying a rear-facing car seat, and preparing the baby's room.  Get help right away if you are bleeding from your vagina, you have chest pain and trouble breathing, or you have not felt the baby move for the amount of time told by your doctor. This information is not intended to replace advice given to you by your health care provider. Make sure you discuss any questions you have with your health care provider. Document Revised: 03/30/2020 Document Reviewed: 02/04/2020 Elsevier Patient Education  2021 [employer].

## 2021-02-07 NOTE — Addendum Note (Signed)
Addended by: Marya Landry D on: 02/07/2021 12:06 PM   Modules accepted: Orders

## 2021-02-07 NOTE — Addendum Note (Signed)
Addended by: Marya Landry D on: 02/07/2021 09:27 AM   Modules accepted: Orders

## 2021-02-07 NOTE — Progress Notes (Signed)
   PRENATAL VISIT NOTE  Subjective:  Ebony Snyder is a 25 y.o. G1P0 at [redacted]w[redacted]d being seen today for ongoing prenatal care.  She is currently monitored for the following issues for this low-risk pregnancy and has Supervision of other normal pregnancy, antepartum; Headache in pregnancy, antepartum; Abnormal antibody titer; Pelvic pressure in pregnancy; Genetic carrier; Gestational diabetes; Anemia in pregnancy; LGA (large for gestational age) fetus affecting management of mother; [redacted] weeks gestation of pregnancy; [redacted] weeks gestation of pregnancy; and [redacted] weeks gestation of pregnancy on their problem list.  Patient doing well with no acute concerns today. She reports headache.  Contractions: Not present. Vag. Bleeding: None.  Movement: Present. Denies leaking of fluid.   The following portions of the patient's history were reviewed and updated as appropriate: allergies, current medications, past family history, past medical history, past social history, past surgical history and problem list. Problem list updated.  Objective:   Vitals:   02/07/21 0832 02/07/21 0840  BP: (!) 142/85 127/84  Pulse: 93 90  Weight: 192 lb (87.1 kg)     Fetal Status: Fetal Heart Rate (bpm): 145 Fundal Height: 36 cm Movement: Present     General:  Alert, oriented and cooperative. Patient is in no acute distress.  Skin: Skin is warm and dry. No rash noted.   Cardiovascular: Normal heart rate noted  Respiratory: Normal respiratory effort, no problems with respiration noted  Abdomen: Soft, gravid, appropriate for gestational age.  Pain/Pressure: Absent     Pelvic: Cervical exam deferred        Extremities: Normal range of motion.     Mental Status:  Normal mood and affect. Normal behavior. Normal judgment and thought content.   Assessment and Plan:  Pregnancy: G1P0 at [redacted]w[redacted]d  1. Diet controlled gestational diabetes mellitus (GDM) in third trimester No blood sugars as patient has not been taking values due to no  strips  2. [redacted] weeks gestation of pregnancy   3. Abnormal antibody titer   4. Genetic carrier   5. Supervision of other normal pregnancy, antepartum   6. Headache in pregnancy, antepartum Per pt this is a migraine - Butalbital-APAP-Caffeine 50-325-40 MG capsule; Take 1-2 capsules by mouth every 6 (six) hours as needed for headache.  Dispense: 30 capsule; Refill: 0  Preterm labor symptoms and general obstetric precautions including but not limited to vaginal bleeding, contractions, leaking of fluid and fetal movement were reviewed in detail with the patient.  Please refer to After Visit Summary for other counseling recommendations.   Return in about 1 week (around 02/14/2021) for ROB, in person, 36 weeks swabs. Female provider requested Mariel Aloe, MD Faculty Attending [facility]

## 2021-02-14 ENCOUNTER — Ambulatory Visit (INDEPENDENT_AMBULATORY_CARE_PROVIDER_SITE_OTHER): Payer: 59

## 2021-02-14 ENCOUNTER — Other Ambulatory Visit: Payer: Self-pay | Admitting: Advanced Practice Midwife

## 2021-02-14 ENCOUNTER — Other Ambulatory Visit (HOSPITAL_COMMUNITY)
Admission: RE | Admit: 2021-02-14 | Discharge: 2021-02-14 | Disposition: A | Discharge status: Home / Self Care | Payer: 59 | Source: Ambulatory Visit | Attending: Nurse Practitioner | Admitting: Nurse Practitioner

## 2021-02-14 ENCOUNTER — Encounter: Payer: Self-pay | Admitting: Nurse Practitioner

## 2021-02-14 ENCOUNTER — Other Ambulatory Visit: Payer: Self-pay

## 2021-02-14 VITALS — BP 133/85 | HR 87 | Wt 193.0 lb

## 2021-02-14 DIAGNOSIS — Z3A36 36 weeks gestation of pregnancy: Secondary | ICD-10-CM

## 2021-02-14 DIAGNOSIS — Z3480 Encounter for supervision of other normal pregnancy, unspecified trimester: Secondary | ICD-10-CM

## 2021-02-14 DIAGNOSIS — O133 Gestational [pregnancy-induced] hypertension without significant proteinuria, third trimester: Secondary | ICD-10-CM

## 2021-02-14 NOTE — Progress Notes (Signed)
   PRENATAL VISIT NOTE  Subjective:  Ebony Snyder is a 25 y.o. G1P0 at 43w1dbeing seen today for ongoing prenatal care.  She is currently monitored for the following issues for this low-risk pregnancy and has Supervision of other normal pregnancy, antepartum; Headache in pregnancy, antepartum; Abnormal antibody titer; Pelvic pressure in pregnancy; Genetic carrier; Gestational diabetes; Anemia in pregnancy; and LGA (large for gestational age) fetus affecting management of mother on their problem list.  Patient reports no complaints.  Contractions: Not present. Vag. Bleeding: None.  Movement: Present. Denies leaking of fluid.   The following portions of the patient's history were reviewed and updated as appropriate: allergies, current medications, past family history, past medical history, past social history, past surgical history and problem list.   Objective:   Vitals:   02/14/21 1428 02/14/21 1441  BP: 140/90 133/85  Pulse: 89 87  Weight: 193 lb (87.5 kg)     Fetal Status: Fetal Heart Rate (bpm): 140 Fundal Height: 36 cm Movement: Present     General:  Alert, oriented and cooperative. Patient is in no acute distress.  Skin: Skin is warm and dry. No rash noted.   Cardiovascular: Normal heart rate noted  Respiratory: Normal respiratory effort, no problems with respiration noted  Abdomen: Soft, gravid, appropriate for gestational age.  Pain/Pressure: Present     Pelvic: Cervical exam performed in the presence of a chaperone Dilation: Fingertip Effacement (%): Thick Station: Ballotable  Extremities: Normal range of motion.  Edema: None  Mental Status: Normal mood and affect. Normal behavior. Normal judgment and thought content.   Assessment and Plan:  Pregnancy: G1P0 at 325w1d. Supervision of other normal pregnancy, antepartum - Doing well, no complaints today - FT/Thick/Ballotable, vertex   - Strep Gp B NAA - Cervicovaginal ancillary only( [place]) - CBC - Comp Met  (CMET) - Protein / creatinine ratio, urine  2. [redacted] weeks gestation of pregnancy   3. Gestational hypertension, third trimester - BP 142/85 last week, BP 140/90 today - Pt asymptomatic; warning signs reviewed - CBC, CMP, PCR today - Pt to come back in 2 days for BP check - Discussed with Dr. CoElly Modenarecommend IOL _0  weeks - Pt was contacted after visit notifying of recommendations. All questions were answered and patient verbalizes understanding.  4. Gestational diabetes - Diet-controlled - Pt did not bring log today  Preterm labor symptoms and general obstetric precautions including but not limited to vaginal bleeding, contractions, leaking of fluid and fetal movement were reviewed in detail with the patient. Pre-eclampsia signs ans symptoms reviewed  Please refer to After Visit Summary for other counseling recommendations.   Return in about 2 days (around 02/16/2021) for BP check.  Future Appointments  Date Time Provider [place]4/19/2022  2:00 PM GrMonico HoarPTVirginiaMC[facility]4/26/2022  3:00 PM [facility]ico [facility]PT [facility]/01/2021 11:30 AM GrMonico HoarPT [facility]  DaRenee HarderCNM 3:10 PM

## 2021-02-14 NOTE — Progress Notes (Signed)
ROB [redacted]w[redacted]d GBS today  CC: None   Pt does not have blood sugar log with her in the office.   B/P elevated pt denies any HA' s or visual changes.  Repeated B/P : 133/85  P 87

## 2021-02-15 ENCOUNTER — Encounter (HOSPITAL_COMMUNITY): Payer: Self-pay | Admitting: *Deleted

## 2021-02-15 ENCOUNTER — Telehealth (HOSPITAL_COMMUNITY): Payer: Self-pay | Admitting: *Deleted

## 2021-02-15 LAB — COMPREHENSIVE METABOLIC PANEL
ALT: 17 IU/L (ref 0–32)
AST: 23 IU/L (ref 0–40)
Albumin/Globulin Ratio: 1.1 — ABNORMAL LOW (ref 1.2–2.2)
Albumin: 3 g/dL — ABNORMAL LOW (ref 3.9–5.0)
Alkaline Phosphatase: 132 IU/L — ABNORMAL HIGH (ref 44–121)
BUN/Creatinine Ratio: 3 — ABNORMAL LOW (ref 9–23)
BUN: 2 mg/dL — ABNORMAL LOW (ref 6–20)
Bilirubin Total: 0.3 mg/dL (ref 0.0–1.2)
CO2: 17 mmol/L — ABNORMAL LOW (ref 20–29)
Calcium: 9.1 mg/dL (ref 8.7–10.2)
Chloride: 107 mmol/L — ABNORMAL HIGH (ref 96–106)
Creatinine, Ser: 0.69 mg/dL (ref 0.57–1.00)
Globulin, Total: 2.8 g/dL (ref 1.5–4.5)
Glucose: 93 mg/dL (ref 65–99)
Potassium: 3.5 mmol/L (ref 3.5–5.2)
Sodium: 141 mmol/L (ref 134–144)
Total Protein: 5.8 g/dL — ABNORMAL LOW (ref 6.0–8.5)
eGFR: 123 mL/min/{1.73_m2} (ref >59)

## 2021-02-15 LAB — CBC
Hematocrit: 31.9 % — ABNORMAL LOW (ref 34.0–46.6)
Hemoglobin: 10.8 g/dL — ABNORMAL LOW (ref 11.1–15.9)
MCH: 29.7 pg (ref 26.6–33.0)
MCHC: 33.9 g/dL (ref 31.5–35.7)
MCV: 88 fL (ref 79–97)
Platelets: 237 10*3/uL (ref 150–450)
RBC: 3.64 x10E6/uL — ABNORMAL LOW (ref 3.77–5.28)
RDW: 13.3 % (ref 11.7–15.4)
WBC: 8.4 10*3/uL (ref 3.4–10.8)

## 2021-02-15 LAB — CERVICOVAGINAL ANCILLARY ONLY
Chlamydia: NEGATIVE
Comment: NEGATIVE
Comment: NEGATIVE
Comment: NORMAL
Neisseria Gonorrhea: NEGATIVE
Trichomonas: NEGATIVE

## 2021-02-15 LAB — PROTEIN / CREATININE RATIO, URINE
Creatinine, Urine: 97.3 mg/dL
Protein, Ur: 13.7 mg/dL
Protein/Creat Ratio: 141 mg/g creat (ref 0–200)

## 2021-02-15 NOTE — Telephone Encounter (Signed)
Preadmission screen  

## 2021-02-16 ENCOUNTER — Other Ambulatory Visit: Payer: Self-pay

## 2021-02-16 ENCOUNTER — Ambulatory Visit (INDEPENDENT_AMBULATORY_CARE_PROVIDER_SITE_OTHER): Payer: 59

## 2021-02-16 VITALS — BP 128/85 | HR 79

## 2021-02-16 DIAGNOSIS — Z0130 Encounter for examination of blood pressure without abnormal findings: Secondary | ICD-10-CM

## 2021-02-16 LAB — STREP GP B NAA: Strep Gp B NAA: NEGATIVE

## 2021-02-16 NOTE — Progress Notes (Signed)
Pt IOL scheduled for Monday 02/20/21. She needs a work note to start maternity leave.

## 2021-02-16 NOTE — Progress Notes (Addendum)
Subjective:  Ebony Snyder is a 25 y.o. female here for BP check. Pt requests letter to start maternity leave.   Hypertension ROS: no medication side effects noted, no TIA's, no chest pain on exertion, no dyspnea on exertion and no swelling of ankles.    Objective:  BP 128/85   Pulse 79   LMP 06/06/2020   Appearance alert, well appearing, and in no distress. General exam BP noted to be well controlled today in office.    Assessment:   Blood Pressure stable   Plan:  IOL schedule 02/20/21  Letter to start maternity leave given   Patient was assessed and managed by nursing staff during this encounter. I have reviewed the chart and agree with the documentation and plan. I have also made any necessary editorial changes.  Coral Ceo, MD 02/16/2021 9:56 AM

## 2021-02-17 ENCOUNTER — Other Ambulatory Visit (HOSPITAL_COMMUNITY)
Admission: RE | Admit: 2021-02-17 | Discharge: 2021-02-17 | Disposition: A | Discharge status: Home / Self Care | Payer: 59 | Source: Ambulatory Visit | Attending: Family Medicine | Admitting: Family Medicine

## 2021-02-17 DIAGNOSIS — Z20822 Contact with and (suspected) exposure to covid-19: Secondary | ICD-10-CM | POA: Insufficient documentation

## 2021-02-17 DIAGNOSIS — Z01812 Encounter for preprocedural laboratory examination: Secondary | ICD-10-CM | POA: Insufficient documentation

## 2021-02-17 LAB — SARS CORONAVIRUS 2 (TAT 6-24 HRS): SARS Coronavirus 2: NEGATIVE

## 2021-02-20 ENCOUNTER — Inpatient Hospital Stay (HOSPITAL_COMMUNITY): Payer: 59 | Admitting: Anesthesiology

## 2021-02-20 ENCOUNTER — Other Ambulatory Visit: Payer: Self-pay

## 2021-02-20 ENCOUNTER — Encounter (HOSPITAL_COMMUNITY): Payer: Self-pay | Admitting: Family Medicine

## 2021-02-20 ENCOUNTER — Inpatient Hospital Stay (HOSPITAL_COMMUNITY): Payer: 59

## 2021-02-20 ENCOUNTER — Inpatient Hospital Stay (HOSPITAL_COMMUNITY)
Admission: AD | Admit: 2021-02-20 | Discharge: 2021-02-23 | DRG: 786 | Disposition: A | Discharge status: Home / Self Care | Payer: 59 | Attending: Obstetrics and Gynecology | Admitting: Obstetrics and Gynecology

## 2021-02-20 DIAGNOSIS — O24410 Gestational diabetes mellitus in pregnancy, diet controlled: Secondary | ICD-10-CM

## 2021-02-20 DIAGNOSIS — O24420 Gestational diabetes mellitus in childbirth, diet controlled: Secondary | ICD-10-CM | POA: Diagnosis present

## 2021-02-20 DIAGNOSIS — Z8632 Personal history of gestational diabetes: Secondary | ICD-10-CM | POA: Diagnosis present

## 2021-02-20 DIAGNOSIS — R760 Raised antibody titer: Secondary | ICD-10-CM | POA: Diagnosis present

## 2021-02-20 DIAGNOSIS — Z20822 Contact with and (suspected) exposure to covid-19: Secondary | ICD-10-CM | POA: Diagnosis present

## 2021-02-20 DIAGNOSIS — O134 Gestational [pregnancy-induced] hypertension without significant proteinuria, complicating childbirth: Secondary | ICD-10-CM | POA: Diagnosis present

## 2021-02-20 DIAGNOSIS — Z3A37 37 weeks gestation of pregnancy: Secondary | ICD-10-CM | POA: Diagnosis not present

## 2021-02-20 DIAGNOSIS — Z98891 History of uterine scar from previous surgery: Secondary | ICD-10-CM

## 2021-02-20 DIAGNOSIS — O3663X0 Maternal care for excessive fetal growth, third trimester, not applicable or unspecified: Secondary | ICD-10-CM | POA: Diagnosis present

## 2021-02-20 DIAGNOSIS — O411230 Chorioamnionitis, third trimester, not applicable or unspecified: Secondary | ICD-10-CM | POA: Diagnosis present

## 2021-02-20 DIAGNOSIS — O139 Gestational [pregnancy-induced] hypertension without significant proteinuria, unspecified trimester: Secondary | ICD-10-CM | POA: Diagnosis present

## 2021-02-20 DIAGNOSIS — Z148 Genetic carrier of other disease: Secondary | ICD-10-CM

## 2021-02-20 DIAGNOSIS — Z3480 Encounter for supervision of other normal pregnancy, unspecified trimester: Secondary | ICD-10-CM

## 2021-02-20 DIAGNOSIS — O9902 Anemia complicating childbirth: Secondary | ICD-10-CM | POA: Diagnosis present

## 2021-02-20 DIAGNOSIS — O99019 Anemia complicating pregnancy, unspecified trimester: Secondary | ICD-10-CM | POA: Diagnosis present

## 2021-02-20 DIAGNOSIS — O24419 Gestational diabetes mellitus in pregnancy, unspecified control: Secondary | ICD-10-CM | POA: Diagnosis present

## 2021-02-20 DIAGNOSIS — Z8759 Personal history of other complications of pregnancy, childbirth and the puerperium: Secondary | ICD-10-CM | POA: Diagnosis present

## 2021-02-20 LAB — CBC
HCT: 30.8 % — ABNORMAL LOW (ref 36.0–46.0)
HCT: 32.7 % — ABNORMAL LOW (ref 36.0–46.0)
Hemoglobin: 10.4 g/dL — ABNORMAL LOW (ref 12.0–15.0)
Hemoglobin: 10.8 g/dL — ABNORMAL LOW (ref 12.0–15.0)
MCH: 29.2 pg (ref 26.0–34.0)
MCH: 28.8 pg (ref 26.0–34.0)
MCHC: 33.8 g/dL (ref 30.0–36.0)
MCHC: 33 g/dL (ref 30.0–36.0)
MCV: 86.5 fL (ref 80.0–100.0)
MCV: 87.2 fL (ref 80.0–100.0)
Platelets: 249 10*3/uL (ref 150–400)
Platelets: 271 10*3/uL (ref 150–400)
RBC: 3.56 MIL/uL — ABNORMAL LOW (ref 3.87–5.11)
RBC: 3.75 MIL/uL — ABNORMAL LOW (ref 3.87–5.11)
RDW: 13.3 % (ref 11.5–15.5)
RDW: 13.5 % (ref 11.5–15.5)
WBC: 6.8 10*3/uL (ref 4.0–10.5)
WBC: 11.6 10*3/uL — ABNORMAL HIGH (ref 4.0–10.5)
nRBC: 0 % (ref 0.0–0.2)
nRBC: 0 % (ref 0.0–0.2)

## 2021-02-20 LAB — COMPREHENSIVE METABOLIC PANEL
ALT: 17 U/L (ref 0–44)
AST: 23 U/L (ref 15–41)
Albumin: 2.5 g/dL — ABNORMAL LOW (ref 3.5–5.0)
Alkaline Phosphatase: 122 U/L (ref 38–126)
Anion gap: 5 (ref 5–15)
BUN: <5 mg/dL — ABNORMAL LOW (ref 6–20)
CO2: 23 mmol/L (ref 22–32)
Calcium: 9.5 mg/dL (ref 8.9–10.3)
Chloride: 110 mmol/L (ref 98–111)
Creatinine, Ser: 0.61 mg/dL (ref 0.44–1.00)
GFR, Estimated: >60 mL/min (ref >60)
Glucose, Bld: 91 mg/dL (ref 70–99)
Potassium: 3 mmol/L — ABNORMAL LOW (ref 3.5–5.1)
Sodium: 138 mmol/L (ref 135–145)
Total Bilirubin: 0.7 mg/dL (ref 0.3–1.2)
Total Protein: 5.8 g/dL — ABNORMAL LOW (ref 6.5–8.1)

## 2021-02-20 LAB — GLUCOSE, CAPILLARY
Glucose-Capillary: 114 mg/dL — ABNORMAL HIGH (ref 70–99)
Glucose-Capillary: 81 mg/dL (ref 70–99)
Glucose-Capillary: 103 mg/dL — ABNORMAL HIGH (ref 70–99)
Glucose-Capillary: 117 mg/dL — ABNORMAL HIGH (ref 70–99)

## 2021-02-20 LAB — TYPE AND SCREEN
ABO/RH(D): B POS
Antibody Screen: NEGATIVE

## 2021-02-20 LAB — PROTEIN / CREATININE RATIO, URINE
Creatinine, Urine: 54.92 mg/dL
Protein Creatinine Ratio: 0.18 mg/mg{Cre} — ABNORMAL HIGH (ref 0.00–0.15)
Total Protein, Urine: 10 mg/dL

## 2021-02-20 LAB — RPR: RPR Ser Ql: NONREACTIVE

## 2021-02-20 MED ORDER — OXYTOCIN BOLUS FROM INFUSION: 333.0000 mL | Freq: Once | INTRAVENOUS | Status: DC

## 2021-02-20 MED ORDER — LIDOCAINE HCL (PF) 1 % IJ SOLN: 30.0000 mL | INTRAMUSCULAR | Status: DC | PRN

## 2021-02-20 MED ORDER — FENTANYL CITRATE (PF) 100 MCG/2ML IJ SOLN
100.0000 ug | INTRAMUSCULAR | Status: DC | PRN
  Administered 2021-02-20 (×7): 100 ug via INTRAVENOUS
  Filled 2021-02-20 (×7): qty 2

## 2021-02-20 MED ORDER — TERBUTALINE SULFATE 1 MG/ML IJ SOLN: 0.2500 mg | Freq: Once | INTRAMUSCULAR | Status: DC | PRN

## 2021-02-20 MED ORDER — FLEET ENEMA 7-19 GM/118ML RE ENEM: 1.0000 | ENEMA | RECTAL | Status: DC | PRN

## 2021-02-20 MED ORDER — OXYTOCIN-SODIUM CHLORIDE 30-0.9 UT/500ML-% IV SOLN: 2.5000 [IU]/h | INTRAVENOUS | Status: DC

## 2021-02-20 MED ORDER — POTASSIUM CHLORIDE CRYS ER 20 MEQ PO TBCR
40.0000 meq | EXTENDED_RELEASE_TABLET | Freq: Two times a day (BID) | ORAL | Status: DC
  Administered 2021-02-20: 40 meq via ORAL
  Filled 2021-02-20 (×2): qty 2

## 2021-02-20 MED ORDER — LACTATED RINGERS IV SOLN
500.0000 mL | Freq: Once | INTRAVENOUS | Status: AC
  Administered 2021-02-20: 500 mL via INTRAVENOUS

## 2021-02-20 MED ORDER — SOD CITRATE-CITRIC ACID 500-334 MG/5ML PO SOLN
30.0000 mL | ORAL | Status: DC | PRN
  Filled 2021-02-20: qty 15

## 2021-02-20 MED ORDER — PHENYLEPHRINE 40 MCG/ML (10ML) SYRINGE FOR IV PUSH (FOR BLOOD PRESSURE SUPPORT): 80.0000 ug | PREFILLED_SYRINGE | INTRAVENOUS | Status: DC | PRN

## 2021-02-20 MED ORDER — PHENYLEPHRINE 40 MCG/ML (10ML) SYRINGE FOR IV PUSH (FOR BLOOD PRESSURE SUPPORT)
80.0000 ug | PREFILLED_SYRINGE | INTRAVENOUS | Status: DC | PRN
  Filled 2021-02-20 (×2): qty 10

## 2021-02-20 MED ORDER — ACETAMINOPHEN 325 MG PO TABS
650.0000 mg | ORAL_TABLET | ORAL | Status: DC | PRN
  Administered 2021-02-21: 650 mg via ORAL
  Filled 2021-02-20: qty 2

## 2021-02-20 MED ORDER — OXYCODONE-ACETAMINOPHEN 5-325 MG PO TABS: 1.0000 | ORAL_TABLET | ORAL | Status: DC | PRN

## 2021-02-20 MED ORDER — ONDANSETRON HCL 4 MG/2ML IJ SOLN
4.0000 mg | Freq: Four times a day (QID) | INTRAMUSCULAR | Status: DC | PRN
  Administered 2021-02-20 – 2021-02-21 (×3): 4 mg via INTRAVENOUS
  Filled 2021-02-20 (×3): qty 2

## 2021-02-20 MED ORDER — OXYTOCIN-SODIUM CHLORIDE 30-0.9 UT/500ML-% IV SOLN
1.0000 m[IU]/min | INTRAVENOUS | Status: DC
  Administered 2021-02-20: 2 m[IU]/min via INTRAVENOUS
  Filled 2021-02-20: qty 500

## 2021-02-20 MED ORDER — OXYCODONE-ACETAMINOPHEN 5-325 MG PO TABS: 2.0000 | ORAL_TABLET | ORAL | Status: DC | PRN

## 2021-02-20 MED ORDER — FENTANYL-BUPIVACAINE-NACL 0.5-0.125-0.9 MG/250ML-% EP SOLN
12.0000 mL/h | EPIDURAL | Status: DC | PRN
  Administered 2021-02-20 – 2021-02-21 (×2): 12 mL/h via EPIDURAL
  Filled 2021-02-20 (×2): qty 250

## 2021-02-20 MED ORDER — DIPHENHYDRAMINE HCL 50 MG/ML IJ SOLN
12.5000 mg | INTRAMUSCULAR | Status: DC | PRN
Start: 2021-02-20 — End: 2021-02-21
  Administered 2021-02-21: 12.5 mg via INTRAVENOUS
  Filled 2021-02-20: qty 1

## 2021-02-20 MED ORDER — EPHEDRINE 5 MG/ML INJ: 10.0000 mg | INTRAVENOUS | Status: DC | PRN

## 2021-02-20 MED ORDER — OXYTOCIN-SODIUM CHLORIDE 30-0.9 UT/500ML-% IV SOLN: 1.0000 m[IU]/min | INTRAVENOUS | Status: DC

## 2021-02-20 MED ORDER — LACTATED RINGERS IV SOLN
500.0000 mL | INTRAVENOUS | Status: DC | PRN
Start: 2021-02-20 — End: 2021-02-21
  Administered 2021-02-21 (×3): 500 mL via INTRAVENOUS

## 2021-02-20 MED ORDER — LACTATED RINGERS IV SOLN: INTRAVENOUS | Status: DC

## 2021-02-20 MED ORDER — LIDOCAINE-EPINEPHRINE (PF) 2 %-1:200000 IJ SOLN
INTRAMUSCULAR | Status: DC | PRN
  Administered 2021-02-20: 4 mL via EPIDURAL
  Administered 2021-02-21: 5 mL via EPIDURAL
  Administered 2021-02-21: 10 mL via EPIDURAL

## 2021-02-20 MED ORDER — MISOPROSTOL 25 MCG QUARTER TABLET
25.0000 ug | ORAL_TABLET | ORAL | Status: DC | PRN
  Administered 2021-02-20: 25 ug via VAGINAL
  Filled 2021-02-20: qty 1

## 2021-02-20 MED ORDER — MISOPROSTOL 50MCG HALF TABLET
50.0000 ug | ORAL_TABLET | ORAL | Status: DC
  Administered 2021-02-20: 50 ug via BUCCAL
  Filled 2021-02-20: qty 1

## 2021-02-20 NOTE — Anesthesia Preprocedure Evaluation (Signed)
Anesthesia Evaluation  Patient identified by MRN, date of birth, ID band Patient awake    Reviewed: Allergy & Precautions, NPO status , Patient's Chart, lab work & pertinent test results  Airway Mallampati: III  TM Distance: >3 FB Neck ROM: Full    Dental no notable dental hx. (+) Teeth Intact   Pulmonary neg pulmonary ROS,    Pulmonary exam normal breath sounds clear to auscultation       Cardiovascular hypertension (gHTN), Normal cardiovascular exam Rhythm:Regular Rate:Normal     Neuro/Psych  Headaches, PSYCHIATRIC DISORDERS Anxiety Depression    GI/Hepatic negative GI ROS, Neg liver ROS,   Endo/Other  negative endocrine ROSdiabetes, Gestational  Renal/GU negative Renal ROS  negative genitourinary   Musculoskeletal negative musculoskeletal ROS (+)   Abdominal   Peds  Hematology  (+) Blood dyscrasia (Hgb 10.8), anemia ,   Anesthesia Other Findings IOL for gHTN  Reproductive/Obstetrics (+) Pregnancy                             Anesthesia Physical Anesthesia Plan  ASA: III  Anesthesia Plan: Epidural   Post-op Pain Management:    Induction:   PONV Risk Score and Plan: Treatment may vary due to age or medical condition  Airway Management Planned: Natural Airway  Additional Equipment:   Intra-op Plan:   Post-operative Plan:   Informed Consent: I have reviewed the patients History and Physical, chart, labs and discussed the procedure including the risks, benefits and alternatives for the proposed anesthesia with the patient or authorized representative who has indicated his/her understanding and acceptance.       Plan Discussed with: Anesthesiologist  Anesthesia Plan Comments: (Patient identified. Risks, benefits, options discussed with patient including but not limited to bleeding, infection, nerve damage, paralysis, failed block, incomplete pain control, headache, blood  pressure changes, nausea, vomiting, reactions to medication, itching, and post partum back pain. Confirmed with bedside nurse the patient's most recent platelet count. Confirmed with the patient that they are not taking any anticoagulation, have any bleeding history or any family history of bleeding disorders. Patient expressed understanding and wishes to proceed. All questions were answered. )        Anesthesia Quick Evaluation

## 2021-02-20 NOTE — Progress Notes (Addendum)
Labor Progress Note Ebony Snyder is a 25 y.o. G1P0 at [redacted]w[redacted]d presented for IOL-gHTN.   S: Patient concerned of shivering after epidural. No other concerns voiced.   O:  BP 120/81   Pulse 86   Temp 98.5 F (36.9 C) (Oral)   Resp 20   Ht 5\' 2"  (1.575 m)   Wt 87.8 kg   LMP 06/06/2020   SpO2 100%   BMI 35.41 kg/m  EFM: baseline 120/moderate variability/+accelerations/no decels Toco: ctx q1-2 min  CVE: Dilation: 4.5 Effacement (%): 60 Station: 0 Presentation: Vertex Exam by:: Dr. [email or web address]   A&P: 25 y.o. G1P0 [redacted]w[redacted]d presented for IOL-gHTN #Labor: S/p cytotec x2 and FB. Pitocin started at 1815; will continue to up-titrate pitocin as clinically indicated. May consider AROM in next check in 4 hours.  #Pain:Epidural in place #FWB:Category 1 strip #GBS:negative #GHTN:Pre eclampsia labsnormal on admission. No other signs or symptoms of PEC. Blood pressures normal to mild range since admission. Continue to monitor. #Anemia of pregnancy: On PO Iron.Hgb-10.8 on admission. #A1GDM:Most recent BG-117.BG q4hr during latent labor. BG q2hr during active labor.  #MOF:breast / bottle #MOC: undecided #Circ: N/A  04-29-1999, Medical Student 10:23 PM  Attestation of Supervision of Student:  I confirm that I have verified the information documented in the medical student's note and that I have also personally reperformed the history, physical exam and all medical decision making activities.  I have verified that all services and findings are accurately documented in this student's note; and I agree with management and plan as outlined in the documentation. I have also made any necessary editorial changes.  Marin Olp, MD [facility] 02/20/2021 10:56 PM

## 2021-02-20 NOTE — Anesthesia Procedure Notes (Signed)
Epidural Patient location during procedure: OB Start time: 02/20/2021 9:30 PM End time: 02/20/2021 9:45 PM  Staffing Anesthesiologist: Elmer Picker, MD Performed: anesthesiologist   Preanesthetic Checklist Completed: patient identified, IV checked, risks and benefits discussed, monitors and equipment checked, pre-op evaluation and timeout performed  Epidural Patient position: sitting Prep: DuraPrep and site prepped and draped Patient monitoring: continuous pulse ox, blood pressure, heart rate and cardiac monitor Approach: midline Location: L3-L4 Injection technique: LOR air  Needle:  Needle type: Tuohy  Needle gauge: 17 G Needle length: 9 cm Needle insertion depth: 6 cm Catheter type: closed end flexible Catheter size: 19 Gauge Catheter at skin depth: 11 cm Test dose: negative  Assessment Sensory level: T8 Events: blood not aspirated, injection not painful, no injection resistance, no paresthesia and negative IV test  Additional Notes Patient identified. Risks/Benefits/Options discussed with patient including but not limited to bleeding, infection, nerve damage, paralysis, failed block, incomplete pain control, headache, blood pressure changes, nausea, vomiting, reactions to medication both or allergic, itching and postpartum back pain. Confirmed with bedside nurse the patient's most recent platelet count. Confirmed with patient that they are not currently taking any anticoagulation, have any bleeding history or any family history of bleeding disorders. Patient expressed understanding and wished to proceed. All questions were answered. Sterile technique was used throughout the entire procedure. Please see nursing notes for vital signs. Test dose was given through epidural catheter and negative prior to continuing to dose epidural or start infusion. Warning signs of high block given to the patient including shortness of breath, tingling/numbness in hands, complete motor block,  or any concerning symptoms with instructions to call for help. Patient was given instructions on fall risk and not to get out of bed. All questions and concerns addressed with instructions to call with any issues or inadequate analgesia.  Reason for block:procedure for pain

## 2021-02-20 NOTE — H&P (Addendum)
OBSTETRIC ADMISSION HISTORY AND PHYSICAL  Ebony Snyder is a 25 y.o. female G1P0 with IUP at 45w0dby 6 wk [place] for I[place] GHTN. She reports +FMs, No LOF, no VB, no blurry vision, headaches or peripheral edema, and RUQ pain.  She plans on breat/formula feeding. She is undecided for birth control. [facility] [place] [facility] [place]--->  Estimated Date of Delivery: 03/13/21  Sono:    _0 , CWD, normal anatomy, Cephalic presentation, Anterior lie, 2644g, 67% EFW   Prenatal History/Complications:  GHTN Type A1GDM  Anemia Hx of Migraines Anti-Lewis antibodies  Past Medical History: Past Medical History:  Diagnosis Date  . Anxiety   . Concussion   . Depression    denies  . Diabetes mellitus without complication ([place]   . Dysmenorrhea   . Migraine without aura   . STD (sexually transmitted disease)     Past Surgical History: Past Surgical History:  Procedure Laterality Date  . MOUTH SURGERY    . NO PAST SURGERIES    . WISDOM TOOTH EXTRACTION      Obstetrical History: OB History    Gravida  1   Para      Term      Preterm      AB      Living        SAB      IAB      Ectopic      Multiple      Live Births              Social History Social History   Socioeconomic History  . Marital status: Single    Spouse name: Not on file  . Number of children: Not on file  . Years of education: Not on file  . Highest education level: Not on file  Occupational History  . Not on file  Tobacco Use  . Smoking status: Never Smoker  . Smokeless tobacco: Never Used  Vaping Use  . Vaping Use: Never used  Substance and Sexual Activity  . Alcohol use: Not Currently    Comment: occ  . Drug use: No  . Sexual activity: Yes    Comment: condoms 50% of the time  Other Topics Concern  . Not on file  Social History Narrative  . Not on file   Social Determinants of Health   Financial Resource Strain: Not on file  Food  Insecurity: Not on file  Transportation Needs: Not on file  Physical Activity: Not on file  Stress: Not on file  Social Connections: Not on file    Family History: Family History  Problem Relation Age of Onset  . Hypertension Mother   . Hypertension Father   . Asthma Sister   . Diabetes Maternal Grandmother   . Early death Maternal Grandmother     Allergies: No Known Allergies  Medications Prior to Admission  Medication Sig Dispense Refill Last Dose  . Accu-Chek Softclix Lancets lancets 1 each by Other route 4 (four) times daily. 100 each 12   . acetaminophen (TYLENOL) 325 MG tablet Take 650 mg by mouth every 6 (six) hours as needed. (Patient not taking: No sig reported)     . Blood Glucose Monitoring Suppl (ACCU-CHEK GUIDE) w/Device KIT 1 Device by Does not apply route 4 (four) times daily. 1 kit 0   . Butalbital-APAP-Caffeine 50-325-40 MG capsule Take 1-2 capsules by mouth every 6 (six) hours as needed for  headache. 30 capsule 0   . cyclobenzaprine (FLEXERIL) 10 MG tablet Take 1 tablet (10 mg total) by mouth every 8 (eight) hours as needed for muscle spasms. (Patient not taking: No sig reported) 20 tablet 1   . ferrous sulfate (FERROUSUL) 325 (65 FE) MG tablet Take 1 tablet (325 mg total) by mouth once for 1 dose. Take one by mouth daily 60 tablet 1   . glucose blood (ACCU-CHEK GUIDE) test strip 1 each by Other route in the morning, at noon, in the evening, and at bedtime. Use as instructed 100 each 12   . Prenat-FeAsp-Meth-FA-DHA w/o A (PRENATE PIXIE) 10-0.6-0.4-200 MG CAPS Take 1 capsule by mouth daily. 30 capsule 11   . terconazole (TERAZOL 7) 0.4 % vaginal cream Place 1 applicator vaginally at bedtime. (Patient not taking: No sig reported) 45 g 0      Review of Systems   All systems reviewed and negative except as stated in HPI  Blood pressure 130/85, pulse 68, temperature 98.6 F (37 C), temperature source Oral, resp. rate 16, height 5' 2" (1.575 m), weight 87.8 kg, last  menstrual period 06/06/2020. General appearance: alert and cooperative Lungs: clear to auscultation bilaterally Heart: regular rate and rhythm Presentation: cephalic palpated on SVE Fetal monitoringBaseline: 130 bpm, Variability: Good {> 6 bpm), Accelerations: Non-reactive but appropriate for gestational age and Decelerations: Absent Uterine activityNone Dilation: Fingertip Effacement (%): Thick Exam by:: Dr. Thompson Grayer   Prenatal labs: ABO, Rh: --/--/PENDING (04/18 0820) Antibody: PENDING (04/18 0820) Rubella: 5.08 (10/20 1536) RPR: Non Reactive (02/09 1029)  HBsAg: Negative (10/20 1536)  HIV: Non Reactive (02/09 1029)  GBS: Negative/-- (04/12 0312)  1 hr Glucola Positive, failed Genetic screening  Elevated carrier risk of SMA Anatomy US Normal  Prenatal Transfer Tool  Maternal Diabetes: Yes:  Diabetes Type:  Diet controlled Genetic Screening: Abnormal:  Results: Other: Elevated risk of SMA Maternal Ultrasounds/Referrals: Normal Fetal Ultrasounds or other Referrals:  None Maternal Substance Abuse:  No Significant Maternal Medications:  None Significant Maternal Lab Results: Group B Strep negative, Antibody Lewis Positive  Results for orders placed or performed during the hospital encounter of 02/20/21 (from the past 24 hour(s))  Type and screen   Collection Time: 02/20/21  8:20 AM  Result Value Ref Range   ABO/RH(D) PENDING    Antibody Screen PENDING    Sample Expiration      02/23/2021,2359 Performed at [facility], [address], [place], [place] [address]   Glucose, capillary   Collection Time: 02/20/21  9:00 AM  Result Value Ref Range   Glucose-Capillary 114 (H) 70 - 99 mg/dL    Patient Active Problem List   Diagnosis Date Noted  . Gestational hypertension 02/20/2021  . LGA (large for gestational age) fetus affecting management of mother 01/07/2021  . Gestational diabetes 12/16/2020  . Anemia in pregnancy 12/16/2020  . Abnormal antibody titer 11/16/2020   . Pelvic pressure in pregnancy 11/16/2020  . 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 Headache in pregnancy, antepartum 09/21/2020  . Supervision of other normal pregnancy, antepartum 08/16/2020    Assessment/Plan:  Ebony Snyder is a 25 y.o. G1P0 at 48w0dhere for IOL GHTN.  #Labor: IOL for gHTN.  Cervical Cytotec 29m placed. FB placed at 0930 after PRN Fentanyl.  #Pain: PRN, desires epidural #FWB: Category 1 #ID: GBS neg #MOF: Breast/Bottle #MOC: Undecided #Circ:  N/A #GHTN: Denies heaache or symptoms of Pre-E. Mild range BP only since admission.  Pre-E labs pending. #Anemia:  On  PO Iron.  CBC pending. #TypeA1GDM:  CBG q4hr  While in latent labor.  Delora Fuel, MD  02/20/2021, 9:17 AM  Attestation of Supervision of Resident:  I confirm that I have verified the information documented in the resident's note and that I have also personally performed the history, physical exam and all medical decision making activities.  I have verified that all services and findings are accurately documented in this note; and I agree with management and plan as outlined in the documentation. I have also made any necessary editorial changes.   Lenoria Chime, MD [facility] [place] 02/20/2021 9:46 AM

## 2021-02-20 NOTE — Progress Notes (Signed)
Labor Progress Note Ebony Snyder is a 25 y.o. G1P0 at [redacted]w[redacted]d presented for IOL for GHTN. S: Patient feeling comfortable.   O:  BP (!) 147/92   Pulse 71   Temp 98.2 F (36.8 C) (Axillary)   Resp 16   Ht 5\' 2"  (1.575 m)   Wt 87.8 kg   LMP 06/06/2020   BMI 35.41 kg/m  EFM: 130/Moderate/ + Accelerations, - Deccelrations  CVE: Dilation: 5 Effacement: 50% Station: -2 Presentation: Vertex Exam by: 08/06/2020 RN  A&P: 25 y.o. G1P0 [redacted]w[redacted]d  presented for IOL for GHTN. #Labor: Progressing well. Foley balloon out at 1600.  Cytotec x2.  Plan to start Pitocin. #Pain: PRN, desires epidural #FWB: Category 1 #GBS negative #GHTN:Pre-E labs normal.  Continue to monitor #Hypokalemia: K-3.0.  Gave 40mg  KDur. #Anemia: On PO Iron. Hgb-10.4 #TypeA1GDM: CBG-103.  CBG q4hr While in latent labor  [redacted]w[redacted]d, MD 6:02 PM

## 2021-02-20 NOTE — Progress Notes (Signed)
Labor Progress Note Ebony Snyder is a 25 y.o. G1P0 at [redacted]w[redacted]d presented for presenting for IOL for GHTN. S: Patient feeling somewhat uncomfortable, now feeling contractions.  Fentanyl helping.  O:  BP 130/85   Pulse 68   Temp 98.6 F (37 C) (Oral)   Resp 16   Ht 5\' 2"  (1.575 m)   Wt 87.8 kg   LMP 06/06/2020   BMI 35.41 kg/m  EFM: 120 /Moderate/+ Accelerations, - Deccelrations  CVE: Dilation: 2 Effacement (%): Thick Station: -3 Presentation: Vertex Exam by:: Dr. [email or web address]   A&P: 25 y.o. G1P0 [redacted]w[redacted]d presented for presenting for IOL for GHTN. #Labor: Progressing well.  Now feeling contractions and having around every 3 minutes. Foley balloon still in place.  Repeat dose of Cytotec. #Pain: PRN, desires epidural #FWB: Category 1 #GBS negative #GHTN:  Pre-E labs normal.  Continue to monitor #Hypokalemia: K-3.0.  Gave 40mg  KDur. #Anemia:  On PO Iron.  Hgb-10.4 #TypeA1GDM:  CBG-81.  CBG q4hr  While in latent labor.  [redacted]w[redacted]d, MD 1:25 PM

## 2021-02-21 ENCOUNTER — Encounter: Payer: 59 | Admitting: Physical Therapy

## 2021-02-21 ENCOUNTER — Encounter (HOSPITAL_COMMUNITY): Payer: Self-pay | Admitting: Family Medicine

## 2021-02-21 ENCOUNTER — Encounter: Payer: 59 | Admitting: Obstetrics and Gynecology

## 2021-02-21 ENCOUNTER — Encounter (HOSPITAL_COMMUNITY)
Admission: AD | Disposition: A | Discharge status: Home / Self Care | Payer: Self-pay | Source: Home / Self Care | Attending: Obstetrics and Gynecology

## 2021-02-21 DIAGNOSIS — O24420 Gestational diabetes mellitus in childbirth, diet controlled: Secondary | ICD-10-CM

## 2021-02-21 DIAGNOSIS — O134 Gestational [pregnancy-induced] hypertension without significant proteinuria, complicating childbirth: Secondary | ICD-10-CM

## 2021-02-21 DIAGNOSIS — Z3A37 37 weeks gestation of pregnancy: Secondary | ICD-10-CM

## 2021-02-21 DIAGNOSIS — O411230 Chorioamnionitis, third trimester, not applicable or unspecified: Secondary | ICD-10-CM

## 2021-02-21 DIAGNOSIS — Z98891 History of uterine scar from previous surgery: Secondary | ICD-10-CM

## 2021-02-21 LAB — GLUCOSE, CAPILLARY
Glucose-Capillary: 86 mg/dL (ref 70–99)
Glucose-Capillary: 89 mg/dL (ref 70–99)
Glucose-Capillary: 102 mg/dL — ABNORMAL HIGH (ref 70–99)
Glucose-Capillary: 77 mg/dL (ref 70–99)
Glucose-Capillary: 79 mg/dL (ref 70–99)
Glucose-Capillary: 83 mg/dL (ref 70–99)
Glucose-Capillary: 73 mg/dL (ref 70–99)
Glucose-Capillary: 92 mg/dL (ref 70–99)
Glucose-Capillary: 97 mg/dL (ref 70–99)

## 2021-02-21 LAB — RESP PANEL BY RT-PCR (FLU A&B, COVID) ARPGX2
Influenza A by PCR: NEGATIVE
Influenza B by PCR: NEGATIVE
SARS Coronavirus 2 by RT PCR: NEGATIVE

## 2021-02-21 SURGERY — Surgical Case
Anesthesia: Epidural

## 2021-02-21 MED ORDER — SOD CITRATE-CITRIC ACID 500-334 MG/5ML PO SOLN
30.0000 mL | ORAL | Status: AC
  Administered 2021-02-21: 30 mL via ORAL

## 2021-02-21 MED ORDER — SODIUM CHLORIDE 0.9 % IV SOLN
2.0000 g | Freq: Four times a day (QID) | INTRAVENOUS | Status: DC
  Administered 2021-02-21: 2 g via INTRAVENOUS
  Filled 2021-02-21: qty 2000

## 2021-02-21 MED ORDER — ONDANSETRON HCL 4 MG/2ML IJ SOLN
INTRAMUSCULAR | Status: DC | PRN
  Administered 2021-02-21: 4 mg via INTRAVENOUS

## 2021-02-21 MED ORDER — TETANUS-DIPHTH-ACELL PERTUSSIS 5-2.5-18.5 LF-MCG/0.5 IM SUSY: 0.5000 mL | PREFILLED_SYRINGE | Freq: Once | INTRAMUSCULAR | Status: DC

## 2021-02-21 MED ORDER — ACETAMINOPHEN 500 MG PO TABS
1000.0000 mg | ORAL_TABLET | Freq: Four times a day (QID) | ORAL | Status: DC
  Administered 2021-02-22 – 2021-02-23 (×6): 1000 mg via ORAL
  Filled 2021-02-21 (×6): qty 2

## 2021-02-21 MED ORDER — DIPHENHYDRAMINE HCL 50 MG/ML IJ SOLN: 12.5000 mg | INTRAMUSCULAR | Status: DC | PRN

## 2021-02-21 MED ORDER — DEXAMETHASONE SODIUM PHOSPHATE 10 MG/ML IJ SOLN
INTRAMUSCULAR | Status: AC
  Filled 2021-02-21: qty 1

## 2021-02-21 MED ORDER — SODIUM CHLORIDE 0.9 % IV SOLN
2.0000 g | Freq: Four times a day (QID) | INTRAVENOUS | Status: AC
  Administered 2021-02-22 – 2021-02-23 (×5): 2 g via INTRAVENOUS
  Filled 2021-02-21 (×5): qty 2000

## 2021-02-21 MED ORDER — SODIUM CHLORIDE 0.9 % IR SOLN
Status: DC | PRN
  Administered 2021-02-21: 1

## 2021-02-21 MED ORDER — OXYTOCIN-SODIUM CHLORIDE 30-0.9 UT/500ML-% IV SOLN: 2.5000 [IU]/h | INTRAVENOUS | Status: AC

## 2021-02-21 MED ORDER — ACETAMINOPHEN 500 MG PO TABS
1000.0000 mg | ORAL_TABLET | Freq: Four times a day (QID) | ORAL | Status: DC | PRN
  Administered 2021-02-21: 1000 mg via ORAL
  Filled 2021-02-21: qty 2

## 2021-02-21 MED ORDER — FLUTICASONE PROPIONATE 50 MCG/ACT NA SUSP
2.0000 | Freq: Every day | NASAL | Status: DC
  Administered 2021-02-21 – 2021-02-23 (×2): 2 via NASAL
  Filled 2021-02-21: qty 16

## 2021-02-21 MED ORDER — ACETAMINOPHEN 500 MG PO TABS: 1000.0000 mg | ORAL_TABLET | Freq: Four times a day (QID) | ORAL | Status: DC

## 2021-02-21 MED ORDER — SODIUM CHLORIDE 0.9 % IV SOLN: 2.0000 g | Freq: Four times a day (QID) | INTRAVENOUS | Status: DC

## 2021-02-21 MED ORDER — NALBUPHINE HCL 10 MG/ML IJ SOLN: 5.0000 mg | INTRAMUSCULAR | Status: DC | PRN

## 2021-02-21 MED ORDER — NALBUPHINE HCL 10 MG/ML IJ SOLN
5.0000 mg | Freq: Once | INTRAMUSCULAR | Status: DC | PRN
Start: 2021-02-21 — End: 2021-02-23

## 2021-02-21 MED ORDER — NALOXONE HCL 4 MG/10ML IJ SOLN
1.0000 ug/kg/h | INTRAVENOUS | Status: DC | PRN
  Filled 2021-02-21: qty 5

## 2021-02-21 MED ORDER — SCOPOLAMINE 1 MG/3DAYS TD PT72
MEDICATED_PATCH | TRANSDERMAL | Status: AC
  Filled 2021-02-21: qty 1

## 2021-02-21 MED ORDER — KETOROLAC TROMETHAMINE 30 MG/ML IJ SOLN: 30.0000 mg | Freq: Once | INTRAMUSCULAR | Status: DC | PRN

## 2021-02-21 MED ORDER — SODIUM CHLORIDE 0.9 % IV SOLN
500.0000 mg | Freq: Once | INTRAVENOUS | Status: AC
  Administered 2021-02-21: 500 mg via INTRAVENOUS

## 2021-02-21 MED ORDER — SCOPOLAMINE 1 MG/3DAYS TD PT72
1.0000 | MEDICATED_PATCH | Freq: Once | TRANSDERMAL | Status: DC
  Administered 2021-02-21: 1.5 mg via TRANSDERMAL

## 2021-02-21 MED ORDER — KETOROLAC TROMETHAMINE 30 MG/ML IJ SOLN: 30.0000 mg | Freq: Four times a day (QID) | INTRAMUSCULAR | Status: DC | PRN

## 2021-02-21 MED ORDER — CEFAZOLIN SODIUM-DEXTROSE 2-4 GM/100ML-% IV SOLN
2.0000 g | Freq: Once | INTRAVENOUS | Status: AC
  Administered 2021-02-21: 2 g via INTRAVENOUS

## 2021-02-21 MED ORDER — PRENATAL MULTIVITAMIN CH
1.0000 | ORAL_TABLET | Freq: Every day | ORAL | Status: DC
  Administered 2021-02-22 – 2021-02-23 (×2): 1 via ORAL
  Filled 2021-02-21 (×2): qty 1

## 2021-02-21 MED ORDER — FENTANYL CITRATE (PF) 100 MCG/2ML IJ SOLN: 25.0000 ug | INTRAMUSCULAR | Status: DC | PRN

## 2021-02-21 MED ORDER — PHENYLEPHRINE 40 MCG/ML (10ML) SYRINGE FOR IV PUSH (FOR BLOOD PRESSURE SUPPORT)
PREFILLED_SYRINGE | INTRAVENOUS | Status: AC
  Filled 2021-02-21: qty 10

## 2021-02-21 MED ORDER — ONDANSETRON HCL 4 MG/2ML IJ SOLN: 4.0000 mg | Freq: Three times a day (TID) | INTRAMUSCULAR | Status: DC | PRN

## 2021-02-21 MED ORDER — DIBUCAINE (PERIANAL) 1 % EX OINT: 1.0000 "application " | TOPICAL_OINTMENT | CUTANEOUS | Status: DC | PRN

## 2021-02-21 MED ORDER — GENTAMICIN SULFATE 40 MG/ML IJ SOLN
5.0000 mg/kg | INTRAVENOUS | Status: DC
  Filled 2021-02-21: qty 8.25

## 2021-02-21 MED ORDER — NALBUPHINE HCL 10 MG/ML IJ SOLN: 5.0000 mg | Freq: Once | INTRAMUSCULAR | Status: DC | PRN

## 2021-02-21 MED ORDER — ONDANSETRON HCL 4 MG/2ML IJ SOLN
INTRAMUSCULAR | Status: AC
  Filled 2021-02-21: qty 2

## 2021-02-21 MED ORDER — ENOXAPARIN SODIUM 40 MG/0.4ML ~~LOC~~ SOLN
40.0000 mg | SUBCUTANEOUS | Status: DC
  Administered 2021-02-22: 40 mg via SUBCUTANEOUS
  Filled 2021-02-21: qty 0.4

## 2021-02-21 MED ORDER — SODIUM CHLORIDE 0.9 % IV SOLN
INTRAVENOUS | Status: AC
  Filled 2021-02-21: qty 500

## 2021-02-21 MED ORDER — SIMETHICONE 80 MG PO CHEW: 80.0000 mg | CHEWABLE_TABLET | Freq: Three times a day (TID) | ORAL | Status: DC

## 2021-02-21 MED ORDER — WITCH HAZEL-GLYCERIN EX PADS: 1.0000 "application " | MEDICATED_PAD | CUTANEOUS | Status: DC | PRN

## 2021-02-21 MED ORDER — DIPHENHYDRAMINE HCL 50 MG/ML IJ SOLN
25.0000 mg | Freq: Once | INTRAMUSCULAR | Status: AC | PRN
  Administered 2021-02-21: 25 mg via INTRAVENOUS

## 2021-02-21 MED ORDER — KETOROLAC TROMETHAMINE 30 MG/ML IJ SOLN
30.0000 mg | Freq: Four times a day (QID) | INTRAMUSCULAR | Status: AC
  Administered 2021-02-22 (×4): 30 mg via INTRAVENOUS
  Filled 2021-02-21 (×4): qty 1

## 2021-02-21 MED ORDER — CLINDAMYCIN PHOSPHATE 900 MG/50ML IV SOLN
900.0000 mg | Freq: Three times a day (TID) | INTRAVENOUS | Status: DC
  Administered 2021-02-21 – 2021-02-22 (×2): 900 mg via INTRAVENOUS
  Filled 2021-02-21 (×2): qty 50

## 2021-02-21 MED ORDER — IBUPROFEN 800 MG PO TABS
800.0000 mg | ORAL_TABLET | Freq: Four times a day (QID) | ORAL | Status: DC
  Administered 2021-02-23 (×3): 800 mg via ORAL
  Filled 2021-02-21 (×3): qty 1

## 2021-02-21 MED ORDER — DEXAMETHASONE SODIUM PHOSPHATE 10 MG/ML IJ SOLN
INTRAMUSCULAR | Status: DC | PRN
  Administered 2021-02-21: 10 mg via INTRAVENOUS

## 2021-02-21 MED ORDER — DIPHENHYDRAMINE HCL 25 MG PO CAPS: 25.0000 mg | ORAL_CAPSULE | ORAL | Status: DC | PRN

## 2021-02-21 MED ORDER — SIMETHICONE 80 MG PO CHEW
80.0000 mg | CHEWABLE_TABLET | ORAL | Status: DC | PRN
  Filled 2021-02-21: qty 1

## 2021-02-21 MED ORDER — GENTAMICIN SULFATE 40 MG/ML IJ SOLN
5.0000 mg/kg | INTRAVENOUS | Status: DC
  Administered 2021-02-21: 330 mg via INTRAVENOUS
  Filled 2021-02-21: qty 8.25

## 2021-02-21 MED ORDER — OXYTOCIN-SODIUM CHLORIDE 30-0.9 UT/500ML-% IV SOLN
INTRAVENOUS | Status: AC
  Filled 2021-02-21: qty 500

## 2021-02-21 MED ORDER — NALOXONE HCL 0.4 MG/ML IJ SOLN: 0.4000 mg | INTRAMUSCULAR | Status: DC | PRN

## 2021-02-21 MED ORDER — CLINDAMYCIN PHOSPHATE 900 MG/50ML IV SOLN
INTRAVENOUS | Status: AC
  Filled 2021-02-21: qty 50

## 2021-02-21 MED ORDER — DIPHENHYDRAMINE HCL 25 MG PO CAPS: 25.0000 mg | ORAL_CAPSULE | Freq: Four times a day (QID) | ORAL | Status: DC | PRN

## 2021-02-21 MED ORDER — SODIUM CHLORIDE 0.9% FLUSH: 3.0000 mL | INTRAVENOUS | Status: DC | PRN

## 2021-02-21 MED ORDER — KETOROLAC TROMETHAMINE 30 MG/ML IJ SOLN
INTRAMUSCULAR | Status: AC
  Filled 2021-02-21: qty 1

## 2021-02-21 MED ORDER — MORPHINE SULFATE (PF) 0.5 MG/ML IJ SOLN
INTRAMUSCULAR | Status: AC
  Filled 2021-02-21: qty 10

## 2021-02-21 MED ORDER — OXYCODONE HCL 5 MG PO TABS
5.0000 mg | ORAL_TABLET | ORAL | Status: DC | PRN
  Administered 2021-02-22 – 2021-02-23 (×3): 5 mg via ORAL
  Filled 2021-02-21 (×3): qty 1

## 2021-02-21 MED ORDER — DIPHENHYDRAMINE HCL 50 MG/ML IJ SOLN
25.0000 mg | Freq: Once | INTRAMUSCULAR | Status: DC
  Filled 2021-02-21: qty 1

## 2021-02-21 MED ORDER — MORPHINE SULFATE (PF) 0.5 MG/ML IJ SOLN
INTRAMUSCULAR | Status: DC | PRN
  Administered 2021-02-21: 3 mg via EPIDURAL

## 2021-02-21 MED ORDER — SENNOSIDES-DOCUSATE SODIUM 8.6-50 MG PO TABS
2.0000 | ORAL_TABLET | Freq: Every day | ORAL | Status: DC
  Administered 2021-02-22 – 2021-02-23 (×2): 2 via ORAL
  Filled 2021-02-21 (×2): qty 2

## 2021-02-21 MED ORDER — MENTHOL 3 MG MT LOZG: 1.0000 | LOZENGE | OROMUCOSAL | Status: DC | PRN

## 2021-02-21 MED ORDER — OXYTOCIN-SODIUM CHLORIDE 30-0.9 UT/500ML-% IV SOLN
INTRAVENOUS | Status: DC | PRN
  Administered 2021-02-21: 300 mL via INTRAVENOUS

## 2021-02-21 MED ORDER — CEFAZOLIN SODIUM-DEXTROSE 2-4 GM/100ML-% IV SOLN
INTRAVENOUS | Status: AC
  Filled 2021-02-21: qty 100

## 2021-02-21 MED ORDER — PHENYLEPHRINE HCL (PRESSORS) 10 MG/ML IV SOLN
INTRAVENOUS | Status: DC | PRN
  Administered 2021-02-21 (×4): 80 ug via INTRAVENOUS

## 2021-02-21 MED ORDER — COCONUT OIL OIL: 1.0000 "application " | TOPICAL_OIL | Status: DC | PRN

## 2021-02-21 SURGICAL SUPPLY — 35 items
APL SKNCLS STERI-STRIP NONHPOA (GAUZE/BANDAGES/DRESSINGS) ×1
BENZOIN TINCTURE PRP APPL 2/3 (GAUZE/BANDAGES/DRESSINGS) ×2 IMPLANT
CANISTER SUCT 3000ML PPV (MISCELLANEOUS) ×2 IMPLANT
CHLORAPREP W/TINT 26ML (MISCELLANEOUS) ×2 IMPLANT
CLOSURE STERI STRIP 1/2 X4 (GAUZE/BANDAGES/DRESSINGS) ×2 IMPLANT
DRSG OPSITE POSTOP 4X10 (GAUZE/BANDAGES/DRESSINGS) ×2 IMPLANT
ELECT REM PT RETURN 9FT ADLT (ELECTROSURGICAL) ×2
ELECTRODE REM PT RTRN 9FT ADLT (ELECTROSURGICAL) ×1 IMPLANT
EXTRACTOR VACUUM KIWI (MISCELLANEOUS) ×2 IMPLANT
GLOVE BIOGEL PI IND STRL 7.0 (GLOVE) ×2 IMPLANT
GLOVE BIOGEL PI IND STRL 7.5 (GLOVE) ×1 IMPLANT
GLOVE BIOGEL PI INDICATOR 7.0 (GLOVE) ×2
GLOVE BIOGEL PI INDICATOR 7.5 (GLOVE) ×1
GLOVE SKINSENSE NS SZ7.0 (GLOVE) ×1
GLOVE SKINSENSE STRL SZ7.0 (GLOVE) ×1 IMPLANT
GOWN STRL REUS W/ TWL LRG LVL3 (GOWN DISPOSABLE) ×2 IMPLANT
GOWN STRL REUS W/ TWL XL LVL3 (GOWN DISPOSABLE) ×1 IMPLANT
GOWN STRL REUS W/TWL LRG LVL3 (GOWN DISPOSABLE) ×4
GOWN STRL REUS W/TWL XL LVL3 (GOWN DISPOSABLE) ×2
NS IRRIG 1000ML POUR BTL (IV SOLUTION) ×2 IMPLANT
PACK C SECTION WH (CUSTOM PROCEDURE TRAY) ×2 IMPLANT
PAD ABD 7.5X8 STRL (GAUZE/BANDAGES/DRESSINGS) ×2 IMPLANT
PAD OB MATERNITY 4.3X12.25 (PERSONAL CARE ITEMS) ×2 IMPLANT
PAD PREP 24X48 CUFFED NSTRL (MISCELLANEOUS) ×2 IMPLANT
PENCIL SMOKE EVAC W/HOLSTER (ELECTROSURGICAL) ×2 IMPLANT
STRIP CLOSURE SKIN 1/2X4 (GAUZE/BANDAGES/DRESSINGS) ×2 IMPLANT
SUT MNCRL 0 VIOLET CTX 36 (SUTURE) ×2 IMPLANT
SUT MON AB 4-0 PS1 27 (SUTURE) ×2 IMPLANT
SUT MONOCRYL 0 CTX 36 (SUTURE) ×4
SUT PLAIN 2 0 XLH (SUTURE) ×2 IMPLANT
SUT VIC AB 0 CT1 36 (SUTURE) ×4 IMPLANT
SUT VIC AB 3-0 CT1 27 (SUTURE) ×2
SUT VIC AB 3-0 CT1 TAPERPNT 27 (SUTURE) ×1 IMPLANT
TOWEL OR 17X24 6PK STRL BLUE (TOWEL DISPOSABLE) ×4 IMPLANT
WATER STERILE IRR 1000ML POUR (IV SOLUTION) ×2 IMPLANT

## 2021-02-21 NOTE — Progress Notes (Signed)
Labor Progress Note Ebony Snyder is a 25 y.o. G1P0 at [redacted]w[redacted]d presented for IOL-gHTN.  S: feeling sick   O:  BP 131/89   Pulse 85   Temp (!) 101.3 F (38.5 C) (Axillary)   Resp 20   Ht 5\' 2"  (1.575 m)   Wt 87.8 kg   LMP 06/06/2020   SpO2 99%   BMI 35.41 kg/m  EFM: baseline 150/moderate variability/+accelerations/no decels Toco: Ctx q1-2 min   CVE: Dilation: 8 Effacement (%): 70 (more on patients left) Station: 0 Presentation: Vertex Exam by:: [email or web address], RN   A&P: 26 y.o. G1P0 [redacted]w[redacted]d presented for IOL-gHTN. #Labor:S/pcytotec x2and FB.Pitocin started at 1815. AROM with clear fluid at 0226. IUPC in place had demonstrated increased uterine tone and tachysystole without being adequate. Pit break from 10-2 and subsequently restarted. Has made minimal change since restart, now with anterior cervical swelling. Baby still ROP. Continue repositioning. Will give benadryl and ice. At this time have discussed with patient the concern for possible need of c section for arrest of dilation. Plan to recheck at three hour mark, patient and family understanding of plan.   #Fever: now with worsening congestion and body aches. Fetal tachycardia present. Will treat for triple I and will re-swab for covid/flu.    #Pain:Epidural in place #FWB:Category 1strip #GBS:negative  #GHTN:Preeclampsialabsnormalon admission.No other signs or symptoms of PEC.Blood pressures normal to mild range sinceadmission.Continue to monitor.  #Anemiaof pregnancy: On PO Iron.Hgb-10.4on admission. #A1GDM:CBG q4hrduringlatent labor. BG q2hr during active labor. #MOF:breast / bottle #MOC:undecided    [redacted]w[redacted]d, MD [facility] Family Medicine Fellow, [facility] for [facility], [facility]

## 2021-02-21 NOTE — Lactation Note (Signed)
This note was copied from a baby's chart. Lactation Consultation Note  Patient Name: [name] [id number] Date: 02/21/2021   Age:26 hours  LC attempted to see.  Baby being assessed and visitors in room.  Rn reports it will be fine to come back later tonight.  LC let RN and mom know that no one from lactation would be available later this pm so mom would be seen first thing in am.  Mom okay with that.   Maternal Data    Feeding    LATCH Score Latch: Repeated attempts needed to sustain latch, nipple held in mouth throughout feeding, stimulation needed to elicit sucking reflex.  Audible Swallowing: A few with stimulation  Type of Nipple: Everted at rest and after stimulation  Comfort (Breast/Nipple): Soft / non-tender  Hold (Positioning): Assistance needed to correctly position infant at breast and maintain latch.  LATCH Score: 7   Lactation Tools Discussed/Used    Interventions    Discharge    Consult Status      Mamoudou Mulvehill Michaelle Copas 02/21/2021, 11:01 PM

## 2021-02-21 NOTE — Op Note (Signed)
Operative Note   SURGERY DATE: 02/21/2021  PRE-OP DIAGNOSIS:  *Pregnancy at 37/1 *Arrest of dilation at 7cm *Failed induction of labor for gestational hypertension *Chorioaminionitis *GDMa1  POST-OP DIAGNOSIS: Same. Delivered   PROCEDURE: primary low transverse cesarean section via pfannenstiel skin incision with double layer uterine closure  SURGEON: Surgeon(s) and Role:    * Breckenridge Bing, MD - Primary  ASSISTANT: Lynnda Shields MD (OB Fellow)  ANESTHESIA: epidural  ESTIMATED BLOOD LOSS:  DRAINS: UOP via indwelling foley  TOTAL IV FLUIDS: crystalloid  VTE PROPHYLAXIS: SCDs to bilateral lower extremities  ANTIBIOTICS: Two grams of Cefazolin were given., within 1 hour of skin incision. Clindamycin and azithromycin were given intraoperatively; patient had recent ampicillin and gentamyicin earlier in the evening  SPECIMENS: Placenta  COMPLICATIONS: None  FINDINGS: No intra-abdominal adhesions were noted. Grossly normal uterus, tubes and ovaries. Clear amniotic fluid, cephalic, occiput posterior, large amount of caput, female infant, weight 3175gm, APGARs 10/10, intact placenta.  PROCEDURE IN DETAIL: The patient was taken to the operating room where anesthesia was administered and normal fetal heart tones were confirmed. She was then prepped and draped in the normal fashion in the dorsal supine position with a leftward tilt.  After a time out was performed, a pfannensteil skin incision was made with the scalpel and carried through to the underlying layer of fascia. The fascia was then incised at the midline and this incision was extended laterally with the mayo scissors. Attention was turned to the superior aspect of the fascial incision which was grasped with the kocher clamps x 2, tented up and the rectus muscles were dissected off with the bovie. In a similar fashion the inferior aspect of the fascial incision was grasped with the kocher clamps, tented up and  the rectus muscles dissected off with the mayo scissors. The rectus muscles were then separated in the midline and the peritoneum was entered bluntly. The bladder blade was inserted and the vesicouterine peritoneum was identified, tented up and entered with the metzenbaum scissors. This incision was extended laterally and the bladder flap was created digitally. The bladder blade was reinserted.  A low transverse hysterotomy was made with the scalpel until the endometrial cavity was breached and the amniotic sac ruptured yielding clear amniotic fluid. This incision was extended bluntly and the infant's head, shoulders and body were delivered atraumatically.The cord was clamped x 2 and cut, and the infant was handed to the awaiting pediatricians, after delayed cord clamping was done.  The placenta was then gradually expressed from the uterus and then the uterus was exteriorized and cleared of all clots and debris. The hysterotomy was repaired with a running suture of 1-0 monocryl. A second imbricating layer of 1-0 monocryl suture was then placed to achieve excellent hemostasis.   The uterus and adnexa were then returned to the abdomen, and the hysterotomy and all operative sites were reinspected and excellent hemostasis was noted after irrigation and suction of the abdomen with warm saline.  The peritoneum was closed with a running stitch of 3-0 Vicryl. The fascia was reapproximated with 0 Vicryl in a simple running fashion bilaterally. The subcutaneous layer was then reapproximated with interrupted sutures of 2-0 plain gut, and the skin was then closed with 4-0 monocryl, in a subcuticular fashion.  The patient  tolerated the procedure well. Sponge, lap, needle, and instrument counts were correct x 2. The patient was transferred to the recovery room awake, alert and breathing independently in stable condition.  Milnor Bing,  Montez Hageman MD Attending Center for [facility])

## 2021-02-21 NOTE — Progress Notes (Signed)
Labor Progress Note CORNEISHA ALVI is a 25 y.o. G1P0 at [redacted]w[redacted]d presented for IOL-gHTN.  S: Complaining of back pain   O:  BP 127/90   Pulse 75   Temp 99.6 F (37.6 C) (Axillary)   Resp 20   Ht 5\' 2"  (1.575 m)   Wt 87.8 kg   LMP 06/06/2020   SpO2 100%   BMI 35.41 kg/m  EFM: baseline 150/moderate variability/+accelerations/no decels Toco: Ctx q1-2 min   CVE: Dilation: 7 Effacement (%): 70 (swollen) Station: 0 Presentation: Vertex Exam by:: Emmamarie Kluender, MD   A&P: 25 y.o. G1P0 [redacted]w[redacted]d presented for IOL-gHTN. #Labor:S/pcytotec x2and FB.Pitocin started at 1815. AROM with clear fluid at 0226. IUPC in place had demonstrated increased uterine tone and tachysystole without being adequate. Pit break started at 10 AM, will plan to restart at 2pm. Baby still ROP. Continue repositioning.    #Pain:Epidural in place #FWB:Category 1strip #GBS:negative  #GHTN:Preeclampsialabsnormalon admission.No other signs or symptoms of PEC.Blood pressures normal to mild range sinceadmission.Continue to monitor.  #Anemiaof pregnancy: On PO Iron.Hgb-10.4on admission. #A1GDM:CBG q4hrduringlatent labor. BG q2hr during active labor. #MOF:breast / bottle #MOC:undecided    [redacted]w[redacted]d, MD [facility] Family Medicine Fellow, [facility] for [place], [facility]

## 2021-02-21 NOTE — Discharge Instructions (Signed)

## 2021-02-21 NOTE — Progress Notes (Addendum)
OB Note  Patient has been 7-8cm since at approximately 7-8am today and was on pitocin prior to a pitocin break from 08-1399 today. She is currently on 10 of pitocin and contracting q64m for MVUs of 150 since at least 1500; she is not feeling her UCs. Cervix also feels edematous and with caput on the fetal head and possibly OP, as well. Fetus currently category II due to minimal variabilty and no accels.  Given this, I told her I recommend a c-section for arrest of dilation, in the setting of chorio, which she is amenable to after d/w her re: r/b. Will do clinda, ancef and azithro and continue amp, gent and clinda for 24h post op.   BPs mild range and no e/o severe pre-eclampsia.   OR aware  Cornelia Copa MD Attending Center for [employer] (Faculty Practice) 02/21/2021 Time: [phone]

## 2021-02-21 NOTE — Progress Notes (Signed)
Patient ID: MYISHIA KASIK, female   DOB: Aug 11, 1996, 25 y.o.   MRN: [id number] SHEMEIKA STARZYK is a 25 y.o. G1P0 at [redacted]w[redacted]d admitted for induction of labor due to gestational hypertension currently on no meds, also A1DM  Subjective: comfortable with epidural but having back pain  Objective: BP (!) 142/101   Pulse 86   Temp 100.2 F (37.9 C) (Axillary)   Resp 20   Ht 5\' 2"  (1.575 m)   Wt 87.8 kg   LMP 06/06/2020   SpO2 98%   BMI 35.41 kg/m  No intake/output data recorded.  FHT:  FHR: 155 bpm, variability: min-mod,  accelerations:  occ 10x10x,  decelerations:  Absent UC:   regular, every 2 minutes  SVE:   Dilation: 7.5 Effacement (%):  (fat anterior lip) Station: 0 (w/ caput at +1) Exam by:: kbooker, cnm  Pitocin @ 10 mu/min MVUs inadequate  Labs: Lab Results  Component Value Date   WBC 11.6 (H) 02/20/2021   HGB 10.8 (L) 02/20/2021   HCT 32.7 (L) 02/20/2021   MCV 87.2 02/20/2021   PLT 271 02/20/2021    Assessment / Plan: IOL d/t GHTN, A1DM, no cervical change essentially since 0800. Notified Dr. 02/22/2021  Labor: protracted Fetal Wellbeing:  Category I Pain Control:  Epidural Pre-eclampsia: asymptomatic, bp's stable and labs stable I/D:  [facility] for Triple I Anticipated MOD: PCS for FTP  Natasha Bence CNM, WHNP-BC 02/21/2021, 7:34 PM

## 2021-02-21 NOTE — Discharge Summary (Addendum)
Postpartum Discharge Summary  Date of Service updated    Patient Name: Ebony Snyder DOB: 20-Aug-1996 MRN: [id number]  Date of admission: 02/20/2021 Delivery date:02/21/2021  Delivering provider: Aletha Halim  Date of discharge: 02/23/2021  Admitting diagnosis: Gestational hypertension [O13.9] Intrauterine pregnancy: [redacted]w[redacted]d    Secondary diagnosis:  Active Problems:   Abnormal antibody titer   Genetic carrier   Gestational diabetes   Anemia in pregnancy   Gestational hypertension   Cesarean delivery delivered  Additional problems: as noted above   Discharge diagnosis: Primary Cesarean delivery delivered                                              Post partum procedures:None Augmentation: AROM, Pitocin, Cytotec and IP Foley Complications: Chorioamnionitis  Hospital course: Induction of Labor With Cesarean Section   25y.o. yo G1P1001 at 388w1das admitted to the hospital 02/20/2021 for induction of labor secondary to gHTN. Patient received cytotec x2 and FB on admission. She was then transitioned to pitocin and had AROM for clear fluid. She progressed to 7-8cm but ultimately went for Cesarean given arrest of dilation in the setting of chorioamnionitis with a Category 2 strip. Delivery details are as follows: Membrane Rupture Time/Date: 2:26 AM ,02/21/2021   Delivery Method:C-Section, Low Transverse  Details of operation can be found in separate operative Note.  Patient had an uncomplicated postpartum course. She is ambulating, tolerating a regular diet, passing flatus, and urinating well.  Patient is discharged home in stable condition on 02/23/21.      Newborn Data: Birth date:02/21/2021  Birth time:8:44 PM  Gender:Female  Living status:Living  Apgars:10 ,10  Weight:3175 g                                 Magnesium Sulfate received: No BMZ received: No Rhophylac:N/A MMR:N/A T-DaP:Given prenatally Transfusion:No  Physical exam  Vitals:   02/22/21 1400 02/22/21 2115  02/23/21 0630 02/23/21 1139  BP: 119/72 108/63 97/67 125/72  Pulse: 73 79 85   Resp: 17 18 17    Temp: 97.7 F (36.5 C) 97.6 F (36.4 C) 97.9 F (36.6 C)   TempSrc: Oral Oral Oral   SpO2: 99% 94% 94%   Weight:      Height:       General: alert, cooperative and no distress Lochia: appropriate Uterine Fundus: firm Incision: Healing well with no significant drainage, No significant erythema, Dressing is clean, dry, and intact DVT Evaluation: No evidence of DVT seen on physical exam. Labs: Lab Results  Component Value Date   WBC 26.8 (H) 02/23/2021   HGB 7.0 (L) 02/23/2021   HCT 20.8 (L) 02/23/2021   MCV 86.7 02/23/2021   PLT 209 02/23/2021   CMP Latest Ref Rng & Units 02/23/2021  Glucose 70 - 99 mg/dL 61(L)  BUN 6 - 20 mg/dL 11  Creatinine 0.44 - 1.00 mg/dL 1.00  Sodium 135 - 145 mmol/L 137  Potassium 3.5 - 5.1 mmol/L 3.4(L)  Chloride 98 - 111 mmol/L 108  CO2 22 - 32 mmol/L 25  Calcium 8.9 - 10.3 mg/dL 8.2(L)  Total Protein 6.5 - 8.1 g/dL 4.3(L)  Total Bilirubin 0.3 - 1.2 mg/dL 0.3  Alkaline Phos 38 - 126 U/L 80  AST 15 - 41 U/L 24  ALT 0 -  44 U/L 12   Edinburgh Score: No flowsheet data found.   After visit meds:  Allergies as of 02/23/2021   No Known Allergies     Medication List    TAKE these medications   Accu-Chek Guide test strip Generic drug: glucose blood 1 each by Other route in the morning, at noon, in the evening, and at bedtime. Use as instructed   Accu-Chek Guide w/Device Kit 1 Device by Does not apply route 4 (four) times daily.   Accu-Chek Softclix Lancets lancets 1 each by Other route 4 (four) times daily.   acetaminophen 500 MG tablet Commonly known as: TYLENOL Take 2 tablets (1,000 mg total) by mouth every 6 (six) hours.   amLODipine 10 MG tablet Commonly known as: NORVASC Take 1 tablet (10 mg total) by mouth daily.   Butalbital-APAP-Caffeine 50-325-40 MG capsule Take 1-2 capsules by mouth every 6 (six) hours as needed for  headache.   coconut oil Oil Apply 1 application topically as needed.   cyclobenzaprine 10 MG tablet Commonly known as: FLEXERIL Take 1 tablet (10 mg total) by mouth every 8 (eight) hours as needed for muscle spasms.   ferrous sulfate 325 (65 FE) MG tablet Take 1 tablet (325 mg total) by mouth every other day. Start taking on: February 24, 2021 What changed:   when to take this  additional instructions   ibuprofen 800 MG tablet Commonly known as: ADVIL Take 1 tablet (800 mg total) by mouth every 6 (six) hours.   oxyCODONE 5 MG immediate release tablet Commonly known as: Oxy IR/ROXICODONE Take 1-2 tablets (5-10 mg total) by mouth every 4 (four) hours as needed for moderate pain.   Prenate Pixie 10-0.6-0.4-200 MG Caps Take 1 capsule by mouth daily.   simethicone 80 MG chewable tablet Commonly known as: MYLICON Chew 1 tablet (80 mg total) by mouth as needed for flatulence.   terconazole 0.4 % vaginal cream Commonly known as: TERAZOL 7 Place 1 applicator vaginally at bedtime.         Discharge home in stable condition Infant Feeding: Breast Infant Disposition:home with mother Discharge instruction: per After Visit Summary and Postpartum booklet. Activity: Advance as tolerated. Pelvic rest for 6 weeks.  Diet: routine diet Future Appointments: Future Appointments  Date Time Provider [place]  03/01/2021 10:20 AM [place] None  04/05/2021 10:15 AM Nugent, Gerrie Nordmann, NP [facility] None   Follow up Visit: Message sent to [facility] by Dr. Astrid Drafts.  Please schedule this patient for a In person postpartum visit in 6 weeks with the following provider: Any provider. Additional Postpartum F/U:Incision check 1 week and BP check 1 week  Low risk pregnancy complicated by: h/o gHTN, A1GDM, Anemia, Chorioamnionitis, now s/p Cesarean secondary to arrest of dilation Delivery mode:  C-Section, Low Transverse  Anticipated Birth Control:  Careers adviser of  Supervision of Student:  I confirm that I have verified the information documented in the resident student's note and that I have also personally reperformed the history, physical exam and all medical decision making activities.  I have verified that all services and findings are accurately documented in this student's note; and I agree with management and plan as outlined in the documentation. I have also made any necessary editorial changes.   -Patient doing well, ambulating at the bedside. Explained drop in Hgb; patient reports no dizziness or weakness. Rather than 1 unit RBCs, will do Feraheme.  -Message sent [facility] to also have patient have 1 hour GTT at  PP visit.  -Patient enrolled in [facility] Baby Rx program [place], [place] for [employer], [employer] 02/23/2021 12:01 PM

## 2021-02-21 NOTE — Progress Notes (Addendum)
Labor Progress Note Ebony Snyder is a 25 y.o. G1P0 at [redacted]w[redacted]d presented for IOL-gHTN.   S: No concerns. Pt resting comfortably with epidural in place.  O:  BP 120/86   Pulse 92   Temp 98.8 F (37.1 C) (Oral)   Resp 18   Ht 5\' 2"  (1.575 m)   Wt 87.8 kg   LMP 06/06/2020   SpO2 100%   BMI 35.41 kg/m  EFM: baseline 140/moderate variability/+accelerations/no decels Toco: ctx every 2-3 min  CVE: Dilation: 4.5 Effacement (%): 60 Station: 0 Presentation: Vertex Exam by:: Dr. [email or web address]   A&P: 25 y.o. G1P0 [redacted]w[redacted]d presented for IOL-gHTN. #Labor: S/pcytotec x2 and FB. Pitocin started at 1815. Given lack of cervical change, AROM for clear fluid performed at 0226. IUPC was placed to facilitate up-titration of pitocin. Given right OP fetal presentation on bedside [redacted]w[redacted]d, mother was repositioned to facilitate fetal descent.  #Pain:Epidural in place #FWB:Category 1 strip #GBS:negative #GHTN:Pre eclampsia labsnormal on admission. No other signs or symptoms of PEC. Blood pressures normal to mild range since admission. Continue to monitor. #Anemia of pregnancy: On PO Iron.Hgb-10.4 on admission. #A1GDM:Most recent BG-86.BG q4hr during latent labor. BG q2hr during active labor.  #MOF:breast / bottle #MOC:undecided #Circ: N/A  09-05-1973, Medical Student 2:34 AM  Attestation of Supervision of Student:  I confirm that I have verified the information documented in the medical student's note and that I have also personally reperformed the history, physical exam and all medical decision making activities.  I have verified that all services and findings are accurately documented in this student's note; and I agree with management and plan as outlined in the documentation. I have also made any necessary editorial changes.  Marin Olp, MD Center for [facility], [facility] 02/21/2021 2:57 AM

## 2021-02-21 NOTE — Progress Notes (Addendum)
Labor Progress Note Ebony Snyder is a 25 y.o. G1P0 at [redacted]w[redacted]d presented for IOL-gHTN.  S: No concerns. Pt resting comfortably with epidural in place.  O:  BP 139/85   Pulse 85   Temp 99.8 F (37.7 C) (Axillary)   Resp 20   Ht 5\' 2"  (1.575 m)   Wt 87.8 kg   LMP 06/06/2020   SpO2 100%   BMI 35.41 kg/m  EFM: baseline 140/moderate variability/+accelerations/no decels Toco: Ctx q1-61min. 140 MVU.  CVE: Dilation: 4 Effacement (%): 90,80 Station: 0 Presentation: Vertex Exam by:: Dr. [email or web address]   A&P: 25 y.o. G1P0 [redacted]w[redacted]d presented for IOL-gHTN. #Labor:S/pcytotec x2and FB.Pitocin started at 1815. AROM with clear fluid at 0226. IUPC was placed to facilitate up-titration of pitocin. Maternal repositioning to facilitate fetal descent given ROP. #Pain:Epidural in place #FWB:Category 1strip #GBS:negative #GHTN:Preeclampsialabsnormalon admission.No other signs or symptoms of PEC.Blood pressures normal to mild range sinceadmission.Continue to monitor. #Anemiaof pregnancy: On PO Iron.Hgb-10.4on admission. #A1GDM:Most recentBG-89.BG q4hrduringlatent labor. BG q2hr during active labor. #MOF:breast / bottle #MOC:undecided #Circ: N/A  [redacted]w[redacted]d, Medical Student 6:50 AM  Attestation of Supervision of Student:  I confirm that I have verified the information documented in the medical student's note and that I have also personally reperformed the history, physical exam and all medical decision making activities.  I have verified that all services and findings are accurately documented in this student's note; and I agree with management and plan as outlined in the documentation. I have also made any necessary editorial changes.  Marin Olp, MD Center for [facility], [facility] 02/21/2021 7:10 AM

## 2021-02-22 LAB — COMPREHENSIVE METABOLIC PANEL
ALT: 14 U/L (ref 0–44)
AST: 26 U/L (ref 15–41)
Albumin: 1.8 g/dL — ABNORMAL LOW (ref 3.5–5.0)
Alkaline Phosphatase: 90 U/L (ref 38–126)
Anion gap: 9 (ref 5–15)
BUN: 5 mg/dL — ABNORMAL LOW (ref 6–20)
CO2: 21 mmol/L — ABNORMAL LOW (ref 22–32)
Calcium: 8.5 mg/dL — ABNORMAL LOW (ref 8.9–10.3)
Chloride: 106 mmol/L (ref 98–111)
Creatinine, Ser: 1.4 mg/dL — ABNORMAL HIGH (ref 0.44–1.00)
GFR, Estimated: 53 mL/min — ABNORMAL LOW (ref >60)
Glucose, Bld: 92 mg/dL (ref 70–99)
Potassium: 3.4 mmol/L — ABNORMAL LOW (ref 3.5–5.1)
Sodium: 136 mmol/L (ref 135–145)
Total Bilirubin: 1.4 mg/dL — ABNORMAL HIGH (ref 0.3–1.2)
Total Protein: 4.5 g/dL — ABNORMAL LOW (ref 6.5–8.1)

## 2021-02-22 LAB — CBC
HCT: 27.3 % — ABNORMAL LOW (ref 36.0–46.0)
Hemoglobin: 9.2 g/dL — ABNORMAL LOW (ref 12.0–15.0)
MCH: 29.4 pg (ref 26.0–34.0)
MCHC: 33.7 g/dL (ref 30.0–36.0)
MCV: 87.2 fL (ref 80.0–100.0)
Platelets: 221 10*3/uL (ref 150–400)
RBC: 3.13 MIL/uL — ABNORMAL LOW (ref 3.87–5.11)
RDW: 13.3 % (ref 11.5–15.5)
WBC: 34.4 10*3/uL — ABNORMAL HIGH (ref 4.0–10.5)
nRBC: 0 % (ref 0.0–0.2)

## 2021-02-22 LAB — GLUCOSE, CAPILLARY: Glucose-Capillary: 96 mg/dL (ref 70–99)

## 2021-02-22 MED ORDER — AMLODIPINE BESYLATE 5 MG PO TABS
10.0000 mg | ORAL_TABLET | Freq: Every day | ORAL | Status: DC
  Filled 2021-02-22: qty 2

## 2021-02-22 MED ORDER — CLINDAMYCIN PHOSPHATE 900 MG/50ML IV SOLN
900.0000 mg | Freq: Three times a day (TID) | INTRAVENOUS | Status: AC
  Administered 2021-02-22 – 2021-02-23 (×3): 900 mg via INTRAVENOUS
  Filled 2021-02-22 (×4): qty 50

## 2021-02-22 MED ORDER — AMLODIPINE BESYLATE 5 MG PO TABS
10.0000 mg | ORAL_TABLET | Freq: Every day | ORAL | Status: DC
  Administered 2021-02-22 – 2021-02-23 (×2): 10 mg via ORAL
  Filled 2021-02-22 (×2): qty 2

## 2021-02-22 MED ORDER — GENTAMICIN SULFATE 40 MG/ML IJ SOLN
5.0000 mg/kg | INTRAVENOUS | Status: AC
  Administered 2021-02-22: 330 mg via INTRAVENOUS
  Filled 2021-02-22 (×2): qty 8.25

## 2021-02-22 MED ORDER — FERROUS SULFATE 325 (65 FE) MG PO TABS
325.0000 mg | ORAL_TABLET | ORAL | Status: DC
  Administered 2021-02-22: 325 mg via ORAL
  Filled 2021-02-22: qty 1

## 2021-02-22 NOTE — Progress Notes (Addendum)
Subjective: Postpartum Day 1: Cesarean Delivery   Patient is doing well without complaints. Ambulating without difficulty. Patient still has foley catheter and has not yet passed flatus. Tolerating PO. Abdominal pain improved, currently 5/10. Vaginal bleeding decreased and described as light period. Pt desires outpatient hormonal IUD.  Objective: Vital signs in last 24 hours: Temp:  [97.4 F (36.3 C)-101.3 F (38.5 C)] 97.4 F (36.3 C) (04/20 0232) Pulse Rate:  [69-179] 69 (04/20 0614) Resp:  [14-18] 17 (04/20 0232) BP: (112-159)/(64-116) 112/78 (04/20 6599) SpO2:  [98 %-100 %] 100 % (04/20 3570)  Physical Exam:  General: alert, cooperative and no distress Lochia: appropriate Uterine Fundus: firm Incision: clear, dry, intact DVT Evaluation: No evidence of DVT seen on physical exam.  Recent Labs    02/20/21 1920 02/22/21 0504  HGB 10.8* 9.2*  HCT 32.7* 27.3*    Assessment/Plan: Status post Cesarean section. Doing well postoperatively. #Chorioamnionitis - Continued on amp, gent, and clinda for 24 hr post op. Pt afebrile s/p delivery. #gHTN  AKI - BP has maintained in the normal to mild range.  Started on norvasc  due to persistent mild range blood pressures s/p delivery. No signs/symptoms concerning for pre-eclampsia. Pre-eclampsia labs this morning notable for anemia and Cr 1.40. Given prolonged labor prior to Cesarean, will plan to repeat BMP and monitor strict I&Os. Will continue to monitor. #A1GDM - PPD#1 FBG 96. Recheck A1c at postpartum visit. #Anemia of pregnancy: hgb 9.2 - start supplement PO iron every other day.  #Milestones - recheck voiding and flatus. #MOF:breast / bottle. Baby is latching well.  #MOC:Outpatient IUD. #Continue current care.  Tenna Child Rosado 02/22/2021, 6:18 AM   Attestation of Supervision of Student:  I confirm that I have verified the information documented in the medical student's note and that I have also personally reperformed the  history, physical exam and all medical decision making activities.  I have verified that all services and findings are accurately documented in this student's note; and I agree with management and plan as outlined in the documentation. I have also made any necessary editorial changes.  Sheila Oats, MD Center for [facility], [facility] 02/22/2021 9:43 AM

## 2021-02-22 NOTE — Lactation Note (Signed)
This note was copied from a baby's chart. Lactation Consultation Note LC to room for f/u visit. Mother requests assistance with positioning. Infant first placed in football hold and then repositioned in laid back positioning where she latched with good rhythmic suckling. Reviewed feeding cues and normal behaviors on day 1 of life. LC left mom and baby bf'ing comfortably.   Patient Name: Ebony Snyder Date: 02/22/2021 Reason for consult: Follow-up assessment Age:14 hours  Feeding Mother's Current Feeding Choice: Breast Milk and Formula  LATCH Score Latch: Grasps breast easily, tongue down, lips flanged, rhythmical sucking.  Audible Swallowing: A few with stimulation  Type of Nipple: Everted at rest and after stimulation  Comfort (Breast/Nipple): Soft / non-tender  Hold (Positioning): Assistance needed to correctly position infant at breast and maintain latch.  LATCH Score: 8  Interventions Interventions: Education;Support pillows;Breast feeding basics reviewed;Assisted with latch;Position options;Skin to skin;Expressed milk;Hand express;Adjust position   Consult Status Consult Status: Follow-up Follow-up type: In-patient   Elder Negus, MA IBCLC 02/22/2021, 3:54 PM

## 2021-02-22 NOTE — Social Work (Signed)
CSW received consult for hx of Anxiety and Depression.  CSW met with MOB to offer support and complete assessment.     CSW introduced self and role. CSW observed infant being assessed by pediatrician MGM Genise also present in room. MOB declined to have her leave the room for assessment. CSW informed MOB of reason for consult and assessed current mood. MOB reported she is currently doing pretty good and had a smooth pregnancy. MOB disclosed she was diagnosed with anxiety and depression in 2015 following the death of her grandfather. MOB denies experiencing any symptoms during pregnancy and stated she last experienced symptoms in 2019, while in college. MOB shared she attended therapy twice while in college and found it to be helpful. MOB denies ever taking medication to treat. MOB identified her entire family as supports, sharing "we are really close." MOB reported she does not speak to FOB, but he will be involved.   CSW provided education regarding the baby blues period versus PPD and provided resources. CSW provided the New Mom Checklist and encouraged MOB to self evaluate and contact a medical professional if symptoms are noted at any time.  CSW provided review of Sudden Infant Death Syndrome (SIDS) precautions. MOB stated she has everything needed for infant, including a bassinet. MOB identified [facility] for follow-up care, and denies any barriers to care. MOB reported she had a WIC appointment yesterday and expressed no additional needs at this time.    CSW identifies no further need for intervention and no barriers to discharge at this time.  Shlonda Dolloff, LCSWA Clinical Social Work [facility] [phone] 

## 2021-02-22 NOTE — Transfer of Care (Signed)
Immediate Anesthesia Transfer of Care Note  Patient: Ebony Snyder  Procedure(s) Performed: CESAREAN SECTION (N/A )  Patient Location: PACU  Anesthesia Type:Epidural  Level of Consciousness: awake, alert , oriented and patient cooperative  Airway & Oxygen Therapy: Patient Spontanous Breathing  Post-op Assessment: Report given to RN and Post -op Vital signs reviewed and stable  Post vital signs: Reviewed and stable  Last Vitals:  Vitals Value Taken Time  BP 112/78 02/22/21 0614  Temp 36.3 C 02/22/21 0232  Pulse 69 02/22/21 0614  Resp 17 02/22/21 0232  SpO2 100 % 02/22/21 0614    Last Pain:  Vitals:   02/22/21 0232  TempSrc: Oral  PainSc:       Patients Stated Pain Goal: 0 (02/22/21 0027)  Complications: No complications documented.

## 2021-02-22 NOTE — Anesthesia Postprocedure Evaluation (Signed)
Anesthesia Post Note  Patient: Ebony Snyder  Procedure(s) Performed: CESAREAN SECTION (N/A )     Patient location during evaluation: PACU Anesthesia Type: Epidural Level of consciousness: oriented and awake and alert Pain management: pain level controlled Vital Signs Assessment: post-procedure vital signs reviewed and stable Respiratory status: spontaneous breathing, respiratory function stable and patient connected to nasal cannula oxygen Cardiovascular status: blood pressure returned to baseline and stable Postop Assessment: no headache, no backache, no apparent nausea or vomiting and epidural receding Anesthetic complications: no   No complications documented.  Last Vitals:  Vitals:   02/22/21 0128 02/22/21 0232  BP: 132/89 124/88  Pulse: 83 83  Resp:  17  Temp:  (!) 36.3 C  SpO2:  99%    Last Pain:  Vitals:   02/22/21 0232  TempSrc: Oral  PainSc:    Pain Goal: Patients Stated Pain Goal: 0 (02/22/21 0027)                 Karston Hyland L Alizabeth Antonio

## 2021-02-22 NOTE — Lactation Note (Signed)
This note was copied from a baby's chart. Lactation Consultation Note  Patient Name: Ebony Snyder [id number] Date: 02/22/2021 Reason for consult: Initial assessment;Early term 37-38.6wks;Primapara;1st time breastfeeding Age:25 years  GDM  Visited with mom of 26 hours old ETI female, she's a P1 and reported moderate breast changes during the pregnancy. Offered assistance with latch but mom politely declined, baby was asleep and she wanted to have her breakfast. Mom asked LC to come back around 3:30 pm for the afternoon feeding, she's aware her RN will help her in the meantime.  Baby's serum glucose were at 36, 66 and 52 and are now WNL. Baby got started early on Similac formula due to hypoglycemia. Reviewed formula supplementation guidelines when feeding at the breast according to baby's age in hours, normal newborn behavior, feeding cues, cluster feeding and size of baby's stomach. Mom told LC she's planning on doing both, breast and formula; she was issued a hand pump, she doesn't have one at home.  Feeding plan:  1. Encouraged mom to feed baby STS 8-12 times/24 hours or sooner if feeding cues are present 2. Do as much STS care as possible 3. Hand expression/pumping was recommended whenever baby is getting formula  BF brochure, BF resources and feeding diary were reviewed. GOB (maternal) present and supportive. Family reported all questions and concerns were answered, they're both aware of LC OP services and will call PRN.   Maternal Data Has patient been taught Hand Expression?: Yes Does the patient have breastfeeding experience prior to this delivery?: No  Feeding Mother's Current Feeding Choice: Breast Milk and Formula  LATCH Score    Lactation Tools Discussed/Used Tools: Pump;Flanges Flange Size: 24 Breast pump type: Manual Pump Education: Setup, frequency, and cleaning;Milk Storage Reason for Pumping: mother's request (she doesn't have a pump at home) Pumping frequency:  as needed and/or every time baby is getting formula  Interventions Interventions: Breast feeding basics reviewed;Hand pump;Breast massage;Hand express;Breast compression;Education  Discharge WIC Program: Yes  Consult Status Consult Status: Follow-up Date: 02/23/21 Follow-up type: In-patient    Kylar Speelman Venetia Constable 02/22/2021, 8:51 AM

## 2021-02-23 ENCOUNTER — Other Ambulatory Visit (HOSPITAL_COMMUNITY): Payer: Self-pay

## 2021-02-23 ENCOUNTER — Encounter (HOSPITAL_COMMUNITY): Payer: Self-pay | Admitting: Obstetrics and Gynecology

## 2021-02-23 LAB — CBC
HCT: 20.8 % — ABNORMAL LOW (ref 36.0–46.0)
Hemoglobin: 7 g/dL — ABNORMAL LOW (ref 12.0–15.0)
MCH: 29.2 pg (ref 26.0–34.0)
MCHC: 33.7 g/dL (ref 30.0–36.0)
MCV: 86.7 fL (ref 80.0–100.0)
Platelets: 209 10*3/uL (ref 150–400)
RBC: 2.4 MIL/uL — ABNORMAL LOW (ref 3.87–5.11)
RDW: 13.4 % (ref 11.5–15.5)
WBC: 26.8 10*3/uL — ABNORMAL HIGH (ref 4.0–10.5)
nRBC: 0 % (ref 0.0–0.2)

## 2021-02-23 LAB — COMPREHENSIVE METABOLIC PANEL WITH GFR
ALT: 12 U/L (ref 0–44)
AST: 24 U/L (ref 15–41)
Albumin: 1.8 g/dL — ABNORMAL LOW (ref 3.5–5.0)
Alkaline Phosphatase: 80 U/L (ref 38–126)
Anion gap: 4 — ABNORMAL LOW (ref 5–15)
BUN: 11 mg/dL (ref 6–20)
CO2: 25 mmol/L (ref 22–32)
Calcium: 8.2 mg/dL — ABNORMAL LOW (ref 8.9–10.3)
Chloride: 108 mmol/L (ref 98–111)
Creatinine, Ser: 1 mg/dL (ref 0.44–1.00)
GFR, Estimated: >60 mL/min
Glucose, Bld: 61 mg/dL — ABNORMAL LOW (ref 70–99)
Potassium: 3.4 mmol/L — ABNORMAL LOW (ref 3.5–5.1)
Sodium: 137 mmol/L (ref 135–145)
Total Bilirubin: 0.3 mg/dL (ref 0.3–1.2)
Total Protein: 4.3 g/dL — ABNORMAL LOW (ref 6.5–8.1)

## 2021-02-23 MED ORDER — SODIUM CHLORIDE 0.9 % IV SOLN
510.0000 mg | Freq: Once | INTRAVENOUS | Status: AC
  Administered 2021-02-23: 510 mg via INTRAVENOUS

## 2021-02-23 MED ORDER — SIMETHICONE 80 MG PO CHEW
80.0000 mg | CHEWABLE_TABLET | ORAL | 0 refills | Status: DC | PRN
  Filled 2021-02-23: qty 30, 5d supply, fill #0

## 2021-02-23 MED ORDER — COCONUT OIL OIL: 1.0000 "application " | TOPICAL_OIL | 0 refills | Status: DC | PRN

## 2021-02-23 MED ORDER — SODIUM CHLORIDE 0.9 % IV SOLN
500.0000 mg | Freq: Once | INTRAVENOUS | Status: DC
  Filled 2021-02-23: qty 25

## 2021-02-23 MED ORDER — OXYCODONE HCL 5 MG PO TABS
5.0000 mg | ORAL_TABLET | ORAL | 0 refills | Status: DC | PRN
  Filled 2021-02-23: qty 30, 3d supply, fill #0

## 2021-02-23 MED ORDER — ACETAMINOPHEN 500 MG PO TABS
1000.0000 mg | ORAL_TABLET | Freq: Four times a day (QID) | ORAL | 0 refills | Status: DC
  Filled 2021-02-23: qty 30, 4d supply, fill #0

## 2021-02-23 MED ORDER — FERROUS SULFATE 325 (65 FE) MG PO TABS
325.0000 mg | ORAL_TABLET | ORAL | 3 refills | Status: DC
Start: 2021-02-24 — End: 2022-06-19
  Filled 2021-02-23: qty 15, 30d supply, fill #0

## 2021-02-23 MED ORDER — AMLODIPINE BESYLATE 10 MG PO TABS
10.0000 mg | ORAL_TABLET | Freq: Every day | ORAL | 0 refills | Status: DC
  Filled 2021-02-23: qty 30, 30d supply, fill #0

## 2021-02-23 MED ORDER — SODIUM CHLORIDE 0.9 % IV SOLN
510.0000 mg | Freq: Once | INTRAVENOUS | Status: DC
  Filled 2021-02-23: qty 17

## 2021-02-23 MED ORDER — IBUPROFEN 800 MG PO TABS
800.0000 mg | ORAL_TABLET | Freq: Four times a day (QID) | ORAL | 0 refills | Status: DC
  Filled 2021-02-23: qty 30, 8d supply, fill #0

## 2021-02-23 NOTE — Lactation Note (Signed)
This note was copied from a baby's chart. Lactation Consultation Note  Patient Name: Ebony Snyder Date: 02/23/2021 Reason for consult: Follow-up assessment;Early term 37-38.6wks;Primapara;1st time breastfeeding Age:26 hours   P1 mother whose infant is now 82 hours old.  This is an ETI at 37+1 weeks.  Mother's feeding preference is breast/bottle.   Mother had no questions/concerns related to breast feeding.  Encouraged her to provide more supplementation as baby desires: 20-36mls/session.  Mother verbalized understanding.  She will continue to feed 8-12 times/24 hours or at least every three hours due to gestational age and size.  Last LATCH score was an 8; voiding/stooling well.  Bilirubin level this a.m. was 8.7 mg/dl at 38 hours of life.  Mother is awaiting her mother to arrive for a discharge home after 1500 today.    Mother does not have a DEBP for home use.  Encouraged her to call her insurance company to determine eligibility for a pump.  Mother is also a [facility] participant and may contact her local WIC office.  RN updated.   Maternal Data Has patient been taught Hand Expression?: Yes Does the patient have breastfeeding experience prior to this delivery?: No  Feeding Mother's Current Feeding Choice: Breast Milk and Formula  LATCH Score                    Lactation Tools Discussed/Used    Interventions    Discharge Pump: Manual;Personal (Mother plans to get a DEBP from her insurance company.  She can also contact [facility])  Consult Status Consult Status: Complete Date: 02/23/21 Follow-up type: Call as needed    Irene Pap Muhammad Vacca 02/23/2021, 2:47 PM

## 2021-02-24 ENCOUNTER — Telehealth: Payer: Self-pay | Admitting: *Deleted

## 2021-02-24 LAB — SURGICAL PATHOLOGY

## 2021-02-24 NOTE — Telephone Encounter (Signed)
Transition Care Management Follow-up Telephone Call  Date of discharge and from where: 02/23/2021 - [facility]  How have you been since you were released from the hospital? "Okay"  Any questions or concerns? No  Items Reviewed:  Did the pt receive and understand the discharge instructions provided? Yes   Medications obtained and verified? Yes   Other? No   Any new allergies since your discharge? No   Dietary orders reviewed? No  Do you have support at home? Yes    Functional Questionnaire: (I = Independent and D = Dependent) ADLs: I  Bathing/Dressing- I  Meal Prep- I  Eating- I  Maintaining continence- I  Transferring/Ambulation- I  Managing Meds- I  Follow up appointments reviewed:   PCP Hospital f/u appt confirmed? No    Specialist Hospital f/u appt confirmed? Yes  Scheduled to see OBGYN on 03/01/2021 @ 1020 and 04/05/2021 @ 1015.  Are transportation arrangements needed? No   If their condition worsens, is the pt aware to call PCP or go to the Emergency Dept.? Yes  Was the patient provided with contact information for the PCP's office or ED? Yes  Was to pt encouraged to call back with questions or concerns? Yes

## 2021-02-25 ENCOUNTER — Inpatient Hospital Stay (HOSPITAL_COMMUNITY)
Admission: AD | Admit: 2021-02-25 | Discharge: 2021-02-25 | Disposition: A | Discharge status: Home / Self Care | Payer: 59 | Attending: Obstetrics and Gynecology | Admitting: Obstetrics and Gynecology

## 2021-02-25 ENCOUNTER — Encounter (HOSPITAL_COMMUNITY): Payer: Self-pay | Admitting: Obstetrics and Gynecology

## 2021-02-25 ENCOUNTER — Other Ambulatory Visit: Payer: Self-pay

## 2021-02-25 DIAGNOSIS — K5900 Constipation, unspecified: Secondary | ICD-10-CM | POA: Diagnosis not present

## 2021-02-25 DIAGNOSIS — O1205 Gestational edema, complicating the puerperium: Secondary | ICD-10-CM | POA: Insufficient documentation

## 2021-02-25 DIAGNOSIS — O99893 Other specified diseases and conditions complicating puerperium: Secondary | ICD-10-CM | POA: Insufficient documentation

## 2021-02-25 DIAGNOSIS — M7989 Other specified soft tissue disorders: Secondary | ICD-10-CM

## 2021-02-25 LAB — CBC
HCT: 23.6 % — ABNORMAL LOW (ref 36.0–46.0)
Hemoglobin: 7.9 g/dL — ABNORMAL LOW (ref 12.0–15.0)
MCH: 28.8 pg (ref 26.0–34.0)
MCHC: 33.5 g/dL (ref 30.0–36.0)
MCV: 86.1 fL (ref 80.0–100.0)
Platelets: 337 10*3/uL (ref 150–400)
RBC: 2.74 MIL/uL — ABNORMAL LOW (ref 3.87–5.11)
RDW: 13.5 % (ref 11.5–15.5)
WBC: 14.3 10*3/uL — ABNORMAL HIGH (ref 4.0–10.5)
nRBC: 0.3 % — ABNORMAL HIGH (ref 0.0–0.2)

## 2021-02-25 NOTE — MAU Note (Signed)
Pt reports large stool resulting from enema.

## 2021-02-25 NOTE — Discharge Instructions (Signed)
You have constipation which is hard stools that are difficult to pass. It is important to have regular bowel movements every 1-3 days that are soft and easy to pass. Hard stools increase your risk of hemorrhoids and are very uncomfortable.   To prevent constipation you can increase the amount of fiber in your diet. Examples of foods with fiber are leafy greens, whole grain breads, oatmeal and other grains.  It is also important to drink at least eight 8oz glass of water everyday.   If you have not has a bowel movement in 4-5 days you made need to clean out your bowel.  This will have establish normal movement through your bowel.    Miralax Clean out  Take 8 capfuls of miralax in 64 oz of gatorade. You can use any fluid that appeals to you (gatorade, water, juice)  Continue to drink at least eight 8 oz glasses of water throughout the day  You can repeat with another 8 capfuls of miralax in 64 oz of gatorade if you are not having a large amount of stools  You will need to be at home and close to a bathroom for about 8 hours when you do the above as you may need to go to the bathroom frequently.   After you are cleaned out: - Continue Colace100mg  twice daily - Start Miralax once daily - Start a daily fiber supplement like metamucil or citrucel - if you are having diarrhea you can reduce to Colace once a day or miralax every other day or a 1/2 capful daily.

## 2021-02-25 NOTE — MAU Note (Signed)
Presents with c/o constipation, states no BM since prior to Cesarean Section on April 19th.  Reports taking Colace twice a day as prescribed.  Also reports increased swelling in ankles and feet since since delivery.  Endorses elevated BP during pregnancy, reason for delivery.  Denies H/A, visual disturbances, and epigastric pain.

## 2021-02-25 NOTE — MAU Provider Note (Addendum)
Chief Complaint:  Swelling Feet & Ankles and Constipation   None    HPI: Ebony Snyder is a 25 y.o. G1P1001, POD#4 from C/S for arrest of dilation, initially IOL for gestational hypertension., who presents to maternity admissions reporting constipation and increased leg swelling.   Constipation: Last bowel movement Saturday or Sunday (4/16 or 4/17) prior to delivery. She has been passing gas. Colace twice daily since discharged, has not tried anything else. Reports intermittent abdominal cramping like she has to use the restroom. No associated fever, nausea, vomiting. Eating and drinking as normal. Lochia mild.   Leg swelling: Bilateral ankles and equal. Onset yesterday, insidious onset. Some discomfort due to the swelling. Denies any associated SOB or pleuritic chest pain. No personal or family history of blood clots. She has been walking around often. She has been taking Norvasc 20m since 4/20. BP 136/85 when last checked at home yesterday. Denies any blurred vision, headache, RUQ abdominal pain, SOB, chest pain.   She otherwise is doing well. Denies any concurrent fever, drainage, or bleeding from surgical site. Still has her honeycomb dressing.   Pregnancy Course: See above.   Past Medical History:  Diagnosis Date  . Anxiety   . Concussion   . Depression    denies  . [place]   . Dysmenorrhea   . Migraine without aura   . STD (sexually transmitted disease)    OB History  Gravida Para Term Preterm AB Living  1 1 1     1   SAB IAB Ectopic Multiple Live Births        0 1    # Outcome Date GA Lbr Len/2nd Weight Sex Delivery Anes PTL Lv  1 Term 02/21/21 [id number] g F CS-LTranv EPI  LIV     Birth Comments: wnl   Past Surgical History:  Procedure Laterality Date  . CESAREAN SECTION N/A 02/21/2021   Procedure: CESAREAN SECTION;  Surgeon: PiAletha HalimMD;  Location: [facility];  Service: Obstetrics;  Laterality: N/A;  Arrest of Dilation  .  MOUTH SURGERY    . NO PAST SURGERIES    . WISDOM TOOTH EXTRACTION     Family History  Problem Relation Age of Onset  . Hypertension Mother   . Hypertension Father   . Asthma Sister   . Diabetes Maternal Grandmother   . Early death Maternal Grandmother    Social History   Tobacco Use  . Smoking status: Never Smoker  . Smokeless tobacco: Never Used  Vaping Use  . Vaping Use: Never used  Substance Use Topics  . Alcohol use: Not Currently    Comment: occ  . Drug use: No   No Known Allergies Medications Prior to Admission  Medication Sig Dispense Refill Last Dose  . acetaminophen (TYLENOL) 500 MG tablet Take 2 tablets (1,000 mg total) by mouth every 6 (six) hours. 30 tablet 0 Past Week at Unknown time  . amLODipine (NORVASC) 10 MG tablet Take 1 tablet (10 mg total) by mouth daily. 30 tablet 0 02/25/2021 at Unknown time  . ferrous sulfate 325 (65 FE) MG tablet Take 1 tablet (325 mg total) by mouth every other day. 15 tablet 3 02/25/2021 at Unknown time  . oxyCODONE (OXY IR/ROXICODONE) 5 MG immediate release tablet Take 1-2 tablets (5-10 mg total) by mouth every 4 (four) hours as needed for moderate pain. 30 tablet 0 02/25/2021 at 0500  . simethicone (MYLICON) 80 MG chewable tablet Chew 1 tablet (80  mg total) by mouth as needed for flatulence. 30 tablet 0 02/25/2021 at Unknown time  . Accu-Chek Softclix Lancets lancets 1 each by Other route 4 (four) times daily. 100 each 12   . Blood Glucose Monitoring Suppl (ACCU-CHEK GUIDE) w/Device KIT 1 Device by Does not apply route 4 (four) times daily. 1 kit 0   . Butalbital-APAP-Caffeine 50-325-40 MG capsule Take 1-2 capsules by mouth every 6 (six) hours as needed for headache. (Patient not taking: Reported on 02/20/2021) 30 capsule 0   . coconut oil OIL Apply 1 application topically as needed.  0   . cyclobenzaprine (FLEXERIL) 10 MG tablet Take 1 tablet (10 mg total) by mouth every 8 (eight) hours as needed for muscle spasms. (Patient not taking: No  sig reported) 20 tablet 1   . glucose blood (ACCU-CHEK GUIDE) test strip 1 each by Other route in the morning, at noon, in the evening, and at bedtime. Use as instructed 100 each 12   . ibuprofen (ADVIL) 800 MG tablet Take 1 tablet (800 mg total) by mouth every 6 (six) hours. 30 tablet 0   . Prenat-FeAsp-Meth-FA-DHA w/o A (PRENATE PIXIE) 10-0.6-0.4-200 MG CAPS Take 1 capsule by mouth daily. 30 capsule 11   . terconazole (TERAZOL 7) 0.4 % vaginal cream Place 1 applicator vaginally at bedtime. (Patient not taking: No sig reported) 45 g 0     I have reviewed patient's Past Medical Hx, Surgical Hx, Family Hx, Social Hx, medications and allergies.   ROS:  Review of Systems  Respiratory: Negative for cough and shortness of breath.   Cardiovascular: Positive for leg swelling. Negative for chest pain and palpitations.  Gastrointestinal: Positive for constipation. Negative for abdominal distention, abdominal pain, diarrhea, nausea and vomiting.  Genitourinary: Negative for dysuria and frequency.    Physical Exam   Patient Vitals for the past 24 hrs:  BP Temp Temp src Pulse Resp SpO2 Height Weight  02/25/21 1855 -- -- -- -- -- 98 % -- --  02/25/21 1850 -- -- -- -- -- 99 % -- --  02/25/21 1846 124/81 -- -- 86 -- -- -- --  02/25/21 1845 -- -- -- -- -- 97 % -- --  02/25/21 1840 -- -- -- -- -- 99 % -- --  02/25/21 1835 -- -- -- -- -- 97 % -- --  02/25/21 1831 128/80 -- -- 81 -- -- -- --  02/25/21 1830 -- -- -- -- -- 97 % -- --  02/25/21 1825 -- -- -- -- -- 97 % -- --  02/25/21 1816 130/78 -- -- 82 -- -- -- --  02/25/21 1815 -- -- -- -- -- 97 % -- --  02/25/21 1810 -- -- -- -- -- 97 % -- --  02/25/21 1805 -- -- -- -- -- 98 % -- --  02/25/21 1801 135/85 -- -- 84 -- -- -- --  02/25/21 1800 -- -- -- -- -- 98 % -- --  02/25/21 1755 -- -- -- -- -- 98 % -- --  02/25/21 1750 -- -- -- -- -- 98 % -- --  02/25/21 1746 135/76 -- -- 81 -- -- -- --  02/25/21 1745 -- -- -- -- -- 98 % -- --  02/25/21 1740  -- -- -- -- -- 98 % -- --  02/25/21 1730 136/84 -- -- 76 19 96 % -- --  02/25/21 1709 132/80 98.2 F (36.8 C) Oral 76 20 99 % -- --  02/25/21 1701 -- -- -- -- -- --  5' 2"  (1.575 m) 86.6 kg    Constitutional: Well-developed, well-nourished female in no acute distress.  Cardiovascular: normal rate & rhythm, 2/6 systolic flow murmur  Respiratory: normal effort, lung sounds clear throughout on RA  GI: Abd soft and non-distended, uterus appropriately tender. No tenderness elsewhere on abdomen. Normoactive BS x 4 MS: Extremities nontender, normal ROM, 1+ pitting edema to BLE to level below knee. BLE equal in size.  Neurologic: Alert and oriented x 4.  Derm: Honeycomb dressing clean, dry, and intact. No skin erythema or warmth to touch of lower extremities.   Fetal Tracing: N/A    Labs: Results for orders placed or performed during the hospital encounter of 02/25/21 (from the past 24 hour(s))  CBC     Status: Abnormal   Collection Time: 02/25/21  6:17 PM  Result Value Ref Range   WBC 14.3 (H) 4.0 - 10.5 K/uL   RBC 2.74 (L) 3.87 - 5.11 MIL/uL   Hemoglobin 7.9 (L) 12.0 - 15.0 g/dL   HCT 23.6 (L) 36.0 - 46.0 %   MCV 86.1 80.0 - 100.0 fL   MCH 28.8 26.0 - 34.0 pg   MCHC 33.5 30.0 - 36.0 g/dL   RDW 13.5 11.5 - 15.5 %   Platelets 337 150 - 400 K/uL   nRBC 0.3 (H) 0.0 - 0.2 %    Imaging:  No results found.  MAU Course: Orders Placed This Encounter  Procedures  . CBC  . Soap suds enema  . Discharge patient Discharge disposition: 01-Home or Self Care; Discharge patient date: 02/25/2021   No orders of the defined types were placed in this encounter.   Assessment/plan: 1. Constipation, unspecified constipation type   2. Leg swelling     25 yo G1P1, now POD#4 from C/S, presenting for evaluation of constipation and lower extremity swelling.  #Constipation:  Last BM 5-6 days ago, still passing flatus, s/p C/S on 4/19.  Benign abdomen without additional concern for SBO. Offered  soapsuds enema vs only bowel regimen at home, she opted to proceed with enema here with subsequent large brown BM.  Provided with MiraLax clean out regimen, continue colace.   #Bilateral LE edema: Equal in size without any concurrent SOB/chest pain, tachycardia, or hypoxia, thus doubt DVT/PE. BP well controlled and asymptomatic. Less likely Norvasc SE. Appears consistent with as expected postpartum, provided reassurance and expectation this should improve in the next few weeks. Keep HTN follow up appt on 4/27.    Discharge home in stable condition. MAU precautions discussed if SOB/chest pain/worsening of symptoms.    Allergies as of 02/25/2021   No Known Allergies     Medication List    STOP taking these medications   Butalbital-APAP-Caffeine 50-325-40 MG capsule   cyclobenzaprine 10 MG tablet Commonly known as: FLEXERIL   terconazole 0.4 % vaginal cream Commonly known as: TERAZOL 7     TAKE these medications   Accu-Chek Guide test strip Generic drug: glucose blood 1 each by Other route in the morning, at noon, in the evening, and at bedtime. Use as instructed   Accu-Chek Guide w/Device Kit 1 Device by Does not apply route 4 (four) times daily.   Accu-Chek Softclix Lancets lancets 1 each by Other route 4 (four) times daily.   Acetaminophen Extra Strength 500 MG tablet Generic drug: acetaminophen Take 2 tablets (1,000 mg total) by mouth every 6 (six) hours.   amLODipine 10 MG tablet Commonly known as: NORVASC Take 1 tablet (10 mg total) by mouth  daily.   coconut oil Oil Apply 1 application topically as needed.   FeroSul 325 (65 FE) MG tablet Generic drug: ferrous sulfate Take 1 tablet (325 mg total) by mouth every other day.   ibuprofen 800 MG tablet Commonly known as: ADVIL Take 1 tablet (800 mg total) by mouth every 6 (six) hours.   oxyCODONE 5 MG immediate release tablet Commonly known as: Oxy IR/ROXICODONE Take 1-2 tablets (5-10 mg total) by mouth every 4  (four) hours as needed for moderate pain.   Prenate Pixie 10-0.6-0.4-200 MG Caps Take 1 capsule by mouth daily.   simethicone 80 MG chewable tablet Commonly known as: MYLICON Chew 1 tablet (80 mg total) by mouth as needed for flatulence.       Darrelyn Hillock, DO  Family Medicine PGY-3   Attestation of Supervision of Student:  I confirm that I have verified the information documented in the resident student's note and that I have also personally reperformed the history, physical exam and all medical decision making activities.  I have verified that all services and findings are accurately documented in this student's note; and I agree with management and plan as outlined in the documentation. I have also made any necessary editorial changes.  Wende Mott, [place] for [employer], [employer] 02/25/2021 7:59 PM

## 2021-02-25 NOTE — MAU Note (Signed)
Approx. soap suds enema instilled after explination. Pt agreeable to proceed with procedure. Tolerated well. Pt will call after she has BM. Labs have been drawn

## 2021-02-28 ENCOUNTER — Ambulatory Visit (INDEPENDENT_AMBULATORY_CARE_PROVIDER_SITE_OTHER): Payer: 59 | Admitting: Obstetrics and Gynecology

## 2021-02-28 ENCOUNTER — Other Ambulatory Visit: Payer: Self-pay

## 2021-02-28 ENCOUNTER — Encounter: Payer: 59 | Admitting: Physical Therapy

## 2021-02-28 ENCOUNTER — Other Ambulatory Visit (HOSPITAL_COMMUNITY): Payer: Self-pay

## 2021-02-28 ENCOUNTER — Encounter: Payer: Self-pay | Admitting: Obstetrics and Gynecology

## 2021-02-28 NOTE — Progress Notes (Signed)
25 yo s/p LTCS on 4/19 with prenatal care complicated by [facility] presenting today for incision and BP check. Patient reports doing well and is without complaints. She is breast and formula feeding with plans to discontinue breastfeeding. She receives help from her mother and father of her baby. She denies any concerns regarding pp depression. She denies HA, visual changes, RUQ/epigastric pain, nausea or emesis  Past Medical History:  Diagnosis Date  . Anxiety   . Concussion   . Depression    denies  . Diabetes mellitus without complication ([facility])   . Dysmenorrhea   . Migraine without aura   . STD (sexually transmitted disease)    Past Surgical History:  Procedure Laterality Date  . CESAREAN SECTION N/A 02/21/2021   Procedure: CESAREAN SECTION;  Surgeon: Roseland Bing, MD;  Location: [facility] LD ORS;  Service: Obstetrics;  Laterality: N/A;  Arrest of Dilation  . MOUTH SURGERY    . NO PAST SURGERIES    . WISDOM TOOTH EXTRACTION     Family History  Problem Relation Age of Onset  . Hypertension Mother   . Hypertension Father   . Asthma Sister   . Diabetes Maternal Grandmother   . Early death Maternal Grandmother    Social History   Tobacco Use  . Smoking status: Never Smoker  . Smokeless tobacco: Never Used  Vaping Use  . Vaping Use: Never used  Substance Use Topics  . Alcohol use: Not Currently    Comment: occ  . Drug use: No   ROS See pertinent in HPI. All other systems reviewed and non contributory  Blood pressure 133/83, pulse 92, height 5\' 2"  (1.575 m), weight 186 lb (84.4 kg), currently breastfeeding. GENERAL: Well-developed, well-nourished female in no acute distress.  ABDOMEN: Soft, nontender, nondistended. No organomegaly. Incision: healing well EXTREMITIES: No cyanosis, clubbing, or edema, 2+ distal pulses.  A/P 25 yo s/p PLTCS on 4/19 with gestational HTN - Incision is healing well - Wound care instructions reviewed - Continue amlodipine - Follow up as scheduled  for pp visit and pp glucola

## 2021-02-28 NOTE — Progress Notes (Signed)
Pt presents due high blood pressure reading at home 163/98 at home. When in office today BP is normal. Checked with BP cuff from home and BP was 133/87. Pt denies any HA, visual changes, or disturbances.

## 2021-03-01 ENCOUNTER — Ambulatory Visit: Payer: 59

## 2021-03-07 ENCOUNTER — Encounter: Payer: 59 | Admitting: Physical Therapy

## 2021-03-08 ENCOUNTER — Telehealth: Payer: Self-pay

## 2021-03-08 NOTE — Telephone Encounter (Signed)
Pt notified and will continue to monitor. Pt also states she is having some pain that the ibuprofen is not helping with and she is out of the oxycodone. Advised patient to use tylenol in between her ibuprofen dosages to help so she will have a pain reliever on board.

## 2021-03-08 NOTE — Telephone Encounter (Signed)
Pt called stating that she is feeling "lumps" under her incision. She has noticed it over the past few days. She denies any broken skin. Pt states the skin is still dry and intact. She denies any bleeding or pus in the incision. She states it is tender but it is not red or swollen. There is no drainage from the incision anywhere. This is the patient's first CSection. Pt states she is not running a fever. Her BP is well controlled on her current medication. She is in some pain but is somewhat controlled with her current medication. She was concerned about the "lumps" that seemed to have appeared over the last few days and the fact that the incision feels hard underneath. Please advise.

## 2021-04-05 ENCOUNTER — Other Ambulatory Visit: Payer: Self-pay

## 2021-04-05 ENCOUNTER — Ambulatory Visit (INDEPENDENT_AMBULATORY_CARE_PROVIDER_SITE_OTHER): Payer: 59 | Admitting: Women's Health

## 2021-04-05 ENCOUNTER — Encounter: Payer: Self-pay | Admitting: Women's Health

## 2021-04-05 ENCOUNTER — Other Ambulatory Visit: Payer: 59

## 2021-04-05 VITALS — BP 108/72 | HR 86 | Wt 187.0 lb

## 2021-04-05 DIAGNOSIS — Z8632 Personal history of gestational diabetes: Secondary | ICD-10-CM

## 2021-04-05 DIAGNOSIS — O9081 Anemia of the puerperium: Secondary | ICD-10-CM | POA: Insufficient documentation

## 2021-04-05 DIAGNOSIS — Z30019 Encounter for initial prescription of contraceptives, unspecified: Secondary | ICD-10-CM

## 2021-04-05 LAB — POCT URINE PREGNANCY: Preg Test, Ur: NEGATIVE

## 2021-04-05 MED ORDER — ELLA 30 MG PO TABS: 1.0000 | ORAL_TABLET | Freq: Once | ORAL | 0 refills | Status: AC

## 2021-04-05 NOTE — Patient Instructions (Addendum)
AREA FAMILY PRACTICE PHYSICIANS  [place] ([address]) . [facility] o [address] [place], [place] [address] o [phone] o Mon-Fri 8:30-12:30, 1:30-5:00 o Accepting Medicaid . [facility] o [address], [place], [place] [address] o [phone] o Mon-Fri 8:00-5:30 . [facility] o [address] [place], [place] [address] o [phone] o Mon, Tue, Thur, Fri 8:30-5:00, Wed 10:00-7:00 (closed 1-2pm) o Accepting Medicaid . [facility] o [address], [place], [place]  [address] o Phone - [phone]   Fax - [phone]  [place] ([address]) . [facility] o [address] [place], [place] [address] o [phone] o Mon-Fri 8:00-5:00 . [facility]  o [address] [place], [place] [address] o [phone] o Mon-Fri 8:30-5:30, Sat (Oct.-Mar.) 9:00-1:00 o Accepting Medicaid  [place] ([address]) . [facility] o [address], [place], [place] [address] o [phone] o Mon-Fri 8:00-5:00  [place] ([address]) . [facility] o [address], [place], [place] [address] o [phone] o Mon-Fri 8:00-5:00 . [facility] o [address], [place], [place] [address] o [phone] o Mon-Fri 8:00-5:00 . [facility] o [address] [place], [place] [address] o [phone] o Mon-Fri 8:00-5:00 . [facility] o [address] [place] [address] o [phone] o Mon-Fri 7:30-5:30  [place] ([address] & [address]) . [facility] o [address] [place], [place] [address] o [phone] o Mon-Thur 8:00-6:00 o Accepting Medicaid . [facility] o [address] [place], [place] [address] o [phone] o Mon-Thur 7:30-7:30, Fri 7:30-4:30 o Accepting Medicaid . [facility] o [address], [place], [place]  [address] o [phone]   Fax - [phone]  [place] ([address] & [address]) . [facility] o [address] [place], [place] [address] o [phone] o Mon-Fri 7:00-5:00 . [facility] o [address], [place], [place] [address] o [phone] o Mon-Fri 8:00-5:00 o Accepting Medicaid . [facility] o [address],  [place], [place] [address] o [phone] o Mon-Fri 8:00-5:00 o Accepting Medicaid  [place] ([address]) . [facility] [place] o [address] [place], [place] [address] o [phone] o Mon-Fri 8:00-5:00 . [facility] o [address], [place], [place] [address] o [phone] o Mon-Fri 8:00-5:00 o Accepting Medicaid . [facility] o [address], [place], [place] [address] o [phone] o Mon-Fri 8:00-5:30, Sat&Sun by appointment (phones open at 8:30) o Accepting Medicaid  [place] ([address]) . [facility] o [address] [place], [place] [address] o [phone] o Mon-Thur 8:00-7:00, Fri 8:00-5:00, Sat 8:00-12:00, Sun 9:00-12:00 o Accepting Medicaid . [facility] [place] o [address], [place], [place] [address] o [phone] o Mon-Thur 8:00-5:00 o Accepting Medicaid . [facility] o [address] [place],  [address] o [phone] o Mon-Fri 8:00-5:30, Sat (Oct.-Mar.) 9:00-1:00 o Accepting Medicaid  [place]  ([address]) .   [facility] o [address] [place], [place] [address] o [phone] o Mon-Fri 8:00-5:00 o Accepting Medicaid   [facility] [phone]) . [facility] o [address], [place], [place] [address] o [phone] o Mon-Fri 8:00-5:00 . Nature conservation officer at [facility] o [address], [place], [place] [address] o [phone] o Mon-Fri 8:00-5:00 . [facility] [place] o [address], [place], [place] [address] o [phone] o Mon-Fri 8:00-5:00 o After hours clinic [facility] [place], [place] [address]) [phone] Mon-Fri 5:00-8:00, Sat 12:00-6:00, Sun 10:00-4:00 o Accepting Medicaid . [facility] o [address] [place], [place]  [address] o [phone]   Fax - [phone]  [place] [phone]) . Nature conservation officer at [facility] o [address] [place], [place] [address] o [phone] o Mon-Fri 8:00-5:00 . [facility] [place] [facility] o [address], [place], [place] [address] o [phone] o Mon-Thur 8:00-7:00, Fri 8:00-5:00, Sat 8:00-12:00        [email or web address] - MIRENA IUD (IUD with hormones), NUVARING        Ulipristal oral tablets What is this medicine? ULIPRISTAL (UE li pris tal) is an emergency contraceptive. It prevents pregnancy if taken within 5 days (120 hours) after your regular birth control fails or you have unprotected sex. This medicine will not work if you are already pregnant. This medicine may be used for other purposes; ask your health care provider or pharmacist if you have questions. COMMON BRAND NAME(S): ella What should I tell my health care provider before I take this medicine? They need to know if you have any of these conditions:  liver disease  an unusual or allergic reaction to ulipristal, other medicines, foods, dyes, or  preservatives  pregnant or trying to get pregnant  breast-feeding How should I use this medicine? Take this medicine by mouth with or without food. Your doctor may want you to use a quick-response pregnancy test prior to using the tablets. Take your medicine as soon as possible and not more than 5 days (120 hours) after the event. This medicine can be taken at any time during your menstrual cycle. Follow the dose instructions of your health care provider exactly. Contact your health care provider right away if you vomit within 3 hours of taking your medicine to discuss if you need to take another tablet. A patient package insert for the product will be given with each prescription and refill. Read this sheet carefully each time. The sheet may change frequently. Contact your pediatrician regarding the use of this medicine in children. Special care may be needed. Overdosage: If you think you have taken too much of this medicine contact a poison control center or emergency room at once. NOTE: This medicine is only for you. Do not share this medicine with others. What if I miss a dose? This medicine is not for regular use. If you vomit within 3 hours of taking your dose, contact your health care professional for instructions. What may interact with this medicine? This medicine may interact with the following medications:  barbiturates such as phenobarbital or primidone  birth control pills  bosentan  carbamazepine  certain medicines for fungal infections like griseofulvin, itraconazole, and ketoconazole  certain medicines for HIV or AIDS or hepatitis  dabigatran  digoxin  felbamate  fexofenadine  oxcarbazepine  phenytoin  rifampin  St. John's Wort  topiramate This list may not describe all possible interactions. Give your health care provider a list of all the medicines, herbs, non-prescription drugs, or dietary supplements you use. Also tell them if you smoke, drink  alcohol, or use illegal drugs. Some items may interact with your medicine. What should I watch for while using this medicine? Your period may begin a few days earlier or later than expected. If your period is more than 7 days late, pregnancy is possible. See your health care provider as soon as you can and get a pregnancy test. Talk to your healthcare provider before taking this medicine if you know or suspect that you are pregnant. Contact your healthcare provider if you think you may be pregnant and you have taken this medicine. If you have severe abdominal pain about 3 to 5 weeks after taking this medicine, you may have a pregnancy outside the womb, which is called an ectopic or tubal pregnancy. Call your health care provider or go to the nearest emergency room right away if you think this is happening. Discuss birth control options with your health care provider. Emergency birth control is not to be used routinely to prevent pregnancy. It should not be used more than once in the same cycle. Birth control pills may not work properly while you are taking this medicine. Wait at least 5 days after taking this medicine to start or continue other hormone based birth control. Be sure to use a reliable barrier contraceptive method (such as a condom with spermicide) between the time you take this medicine and your next period. This medicine does not protect you against HIV infection (AIDS) or any other sexually transmitted diseases (STDs). What side effects may I notice from receiving this medicine? Side effects that you should report to your doctor or health care professional as soon as possible:  allergic reactions like skin rash, itching or hives, swelling of the face, lips, or tongue Side effects that usually do not require medical attention (report to your doctor or health care professional if they continue or are bothersome):  abdominal pain or  cramping  dizziness  headache  nausea  spotting  tiredness This list may not describe all possible side effects. Call your doctor for medical advice about side effects. You may report side effects to FDA at 1-800-FDA-1088. Where should I keep my medicine? Keep out of the reach of children. Store at between 20 and 25 degrees C (68 and 77 degrees F). Protect from light and keep in the blister card inside the original box until you are ready to take it. Throw away any unused medicine after the expiration date. NOTE: This sheet is a summary. It may not cover all possible information. If you have questions about this medicine, talk to your doctor, pharmacist, or health care provider.  2021 Elsevier/Gold Standard (2017-03-08 14:27:59)

## 2021-04-05 NOTE — Progress Notes (Signed)
Post Partum Visit Note  Ebony Snyder is a 25 y.o. G19P1001 female who presents for a postpartum visit. She is one month postpartum following a c-section method of delivery.  I have fully reviewed the prenatal and intrapartum course. The delivery was at 37 gestational weeks.  Anesthesia: Epidual. Postpartum course has been uncomplicated. Baby is doing well. Baby is feeding by bottle. Bleeding yes  Bowel function is normal. Bladder function is normal. Patient is not  sexually active. Contraception method is nothing at his time. Postpartum depression screening: negative.  The pregnancy intention screening data noted above was reviewed. Potential methods of contraception were discussed. The patient elected to proceed with No Method - Other Reason.    Edinburgh Postnatal Depression Scale - 04/05/21 0928      Edinburgh Postnatal Depression Scale:  In the Past 7 Days   I have been able to laugh and see the funny side of things. 0    I have looked forward with enjoyment to things. 0    I have blamed myself unnecessarily when things went wrong. 0    I have been anxious or worried for no good reason. 0    I have felt scared or panicky for no good reason. 0    Things have been getting on top of me. 0    I have been so unhappy that I have had difficulty sleeping. 0    I have felt sad or miserable. 0    I have been so unhappy that I have been crying. 0    The thought of harming myself has occurred to me. 0    Edinburgh Postnatal Depression Scale Total 0           Health Maintenance Due  Topic Date Due  . COVID-19 Vaccine (1) Never done  . URINE MICROALBUMIN  Never done  . HPV VACCINES (1 - 2-dose series) Never done    The following portions of the patient's history were reviewed and updated as appropriate: allergies, current medications, past family history, past medical history, past social history, past surgical history and problem list.  Review of Systems Pertinent items noted in  HPI and remainder of comprehensive ROS otherwise negative.  Objective:  BP 108/72   Pulse 86   Wt 187 lb (84.8 kg)   BMI 34.20 kg/m    General:  alert, cooperative and no distress   Breasts:  not indicated  Lungs: clear to auscultation bilaterally  Heart:  regular rate and rhythm, S1, S2 normal, no murmur, click, rub or gallop  Abdomen: soft, non-tender; bowel sounds normal; no masses,  no organomegaly   Wound well approximated incision  GU exam:  not indicated       Assessment:   1. Encounter for female birth control - pt came in requesting IUD, but had intercourse in the past two weeks, she reports sex about 2.5 weeks pp and then again on Saturday night around 8/9PM without protection. Patient reports is currently still breastfeeding. -UPT negative today -pt did not want to do pills because she tried them in the past and cannot remember to take a pill daily due to her daily changing schedule -patient also used Depo in the past but got acne and a 3 month long period and does not wish to use this method again -pt also does not want Nexplanon as she does not like the thought of something in her arm, but is OK with the thought of having an IUD  in her uterus - POCT urine pregnancy - negative - Rx ELLA sent to pharmacy, pt advised to pump and dump breastmilk for 24 hours after administration; pt advised if Samson Frederic unavailable at pharmacy to ask if they can overnight ship as tomorrow at [facility] is the latest patient can take Samson Frederic to stay within the 120 hour window, or to see if another pharmacy in the area has it in stock. If not, pt can call office and we can try sending to a pharmacy other than [facility]. Patient advised cannot use alternative method of hormonal birth control along with Samson Frederic, and will wait until patient has next menses to start [facility] method. Patient can make appt for IUD insertion after next menses begins, or can schedule visit to talk about other methods, as desired. Patient advised if  she would like to move forward with prescription for NuvaRing, this can be sent in for her without another visit, if she does not have any questions.  2. Anemia, postpartum - CBC, pt left prior to being collected, will need at f/u visit, future order entered  3. History of gestational diabetes -pt reports she was not informed that she would need glucose test today and cannot stay, discussed need to be fasting the midnight before appt, pt will reschedule  4. Postpartum exam  Normal postpartum exam.   Plan:   Essential components of care per ACOG recommendations:  1.  Mood and well being: Patient with negative depression screening today. Reviewed local resources for support.  - Patient tobacco use? No.   - hx of drug use? No.    2. Infant care and feeding:  -Patient currently breastmilk feeding? Yes, but pt reports will be stopping breastfeeding shortly and has been weaning. -Social determinants of health (SDOH) reviewed in EPIC.  3. Sexuality, contraception and birth spacing - Patient does not want a pregnancy in the next year.  Desired family size is unsure children.  - Reviewed forms of contraception in tiered fashion. Patient desired unsure method today.   - bedsider.org website given to patient to review information - Discussed birth spacing of 18 months  4. Sleep and fatigue -Encouraged family/partner/community support of 4 hrs of uninterrupted sleep to help with mood and fatigue  5. Physical Recovery  - Discussed patients delivery and complications. She describes her labor as good. - Patient had a C-section. Patient had a none laceration. Perineal healing reviewed. Patient expressed understanding - Patient has urinary incontinence? No. - Patient is safe to resume physical and is not safe to resume sexual activity   6.  Health Maintenance - HM due items addressed - none due - Last pap smear  Diagnosis  Date Value Ref Range Status  01/18/2020   Final   - Negative for  intraepithelial lesion or malignancy (NILM)   Pap smear not done at today's visit.  -Breast Cancer screening indicated? No.   7. Chronic Disease/Pregnancy Condition follow up: Anemia, Hypertension and Gestational Diabetes - PCP follow up, PCP list  given  Marylen Ponto, NP [facility] [employer], [facility]

## 2021-04-14 ENCOUNTER — Other Ambulatory Visit: Payer: 59

## 2021-04-14 ENCOUNTER — Other Ambulatory Visit: Payer: Self-pay

## 2021-04-14 DIAGNOSIS — Z8632 Personal history of gestational diabetes: Secondary | ICD-10-CM

## 2021-04-15 LAB — GLUCOSE TOLERANCE, 2 HOURS
Glucose, 2 hour: 97 mg/dL (ref 65–139)
Glucose, GTT - Fasting: 82 mg/dL (ref 65–99)

## 2021-05-04 ENCOUNTER — Ambulatory Visit: Payer: 59 | Admitting: Advanced Practice Midwife

## 2021-05-04 NOTE — Progress Notes (Deleted)
  GYNECOLOGY PROGRESS NOTE  History:  25 y.o. G1P1001 presents to [facility] *** office today for problem gyn visit. She reports *****.  She denies h/a, dizziness, shortness of breath, n/v, or fever/chills.    The following portions of the patient's history were reviewed and updated as appropriate: allergies, current medications, past family history, past medical history, past social history, past surgical history and problem list. Last pap smear on *** was normal, *** HRHPV.  Health Maintenance Due  Topic Date Due   COVID-19 Vaccine (1) Never done   URINE MICROALBUMIN  Never done   HPV VACCINES (1 - 2-dose series) Never done     Review of Systems:  Pertinent items are noted in HPI.   Objective:  Physical Exam currently breastfeeding. VS reviewed, nursing note reviewed,  Constitutional: well developed, well nourished, no distress HEENT: normocephalic CV: normal rate Pulm/chest wall: normal effort Breast Exam: deferred Abdomen: soft Neuro: alert and oriented x 3 Skin: warm, dry Psych: affect normal Pelvic exam: Cervix pink, visually closed, without lesion, scant white creamy discharge, vaginal walls and external genitalia normal Bimanual exam: Cervix 0/long/high, firm, anterior, neg CMT, uterus nontender, nonenlarged, adnexa without tenderness, enlargement, or mass  IUD Procedure Note Patient identified, informed consent performed.  Discussed risks of irregular bleeding, cramping, infection, malpositioning or misplacement of the IUD outside the uterus which may require further procedures. Time out was performed.  Urine pregnancy test negative.  Speculum placed in the vagina.  Cervix visualized.  Cleaned with Betadine x 2.  Grasped anteriorly with a single tooth tenaculum.  Uterus sounded to *** cm.  Mirena IUD placed per manufacturer's recommendations.  Strings trimmed to 3 cm. Tenaculum was removed, good hemostasis noted.  Patient tolerated procedure well.   Patient was given  post-procedure instructions and the Mirena care card with expiration date.  Patient was also asked to check IUD strings periodically and follow up in 4-6 weeks for IUD check.   Assessment & Plan:  1. Encounter for counseling regarding contraception ***   Sharen Counter, CNM 8:34 AM

## 2021-05-19 IMAGING — US US MFM OB FOLLOW-UP
1 series · 14 of 28 positions shown · non-contrast
Comparison: none

[Series 1: us mfm ob follow-up · 42 acquisitions, 14 frames shown]
[im 2/42]
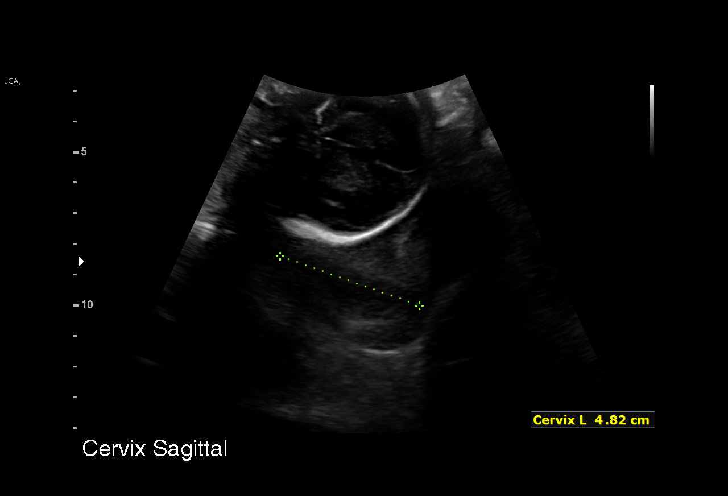
[im 5/42]
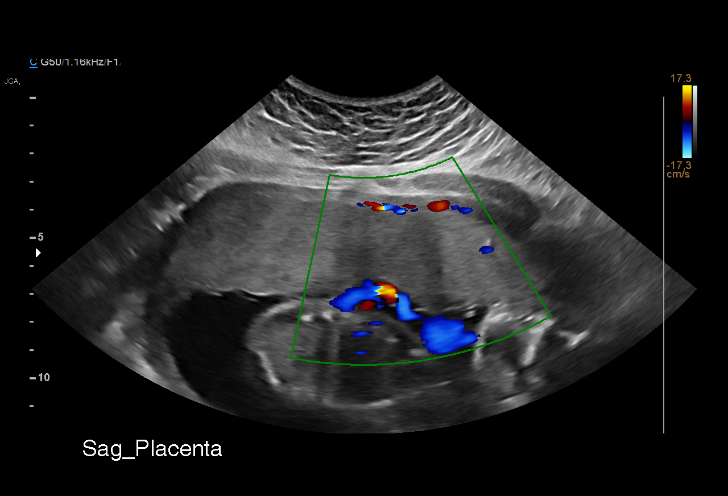
[im 8/42]
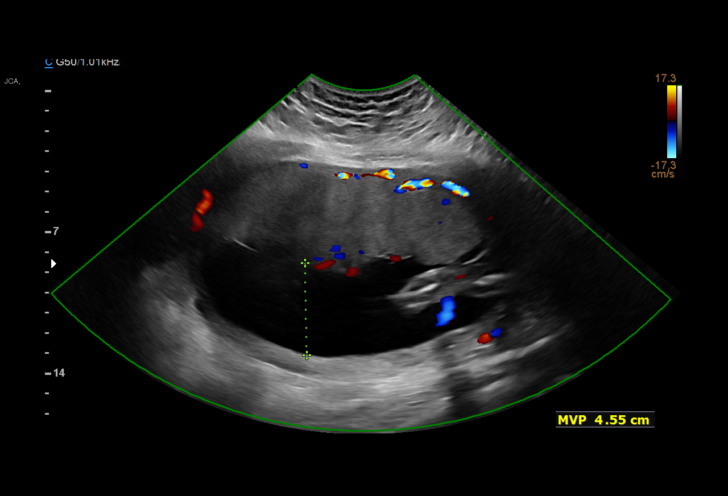
[im 11/42]
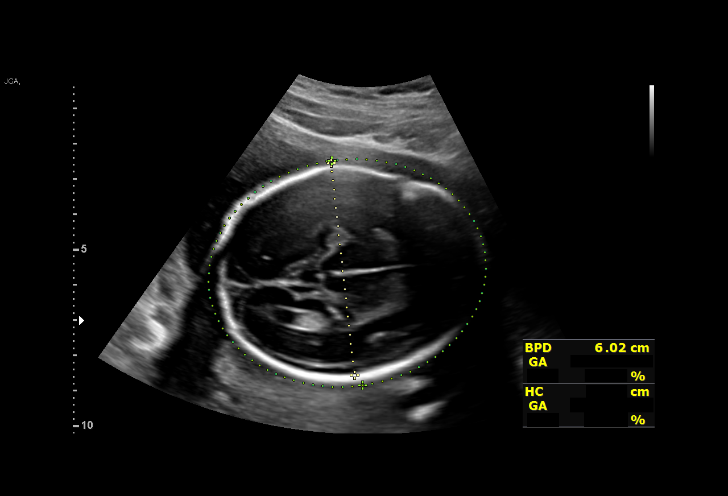
[im 14/42]
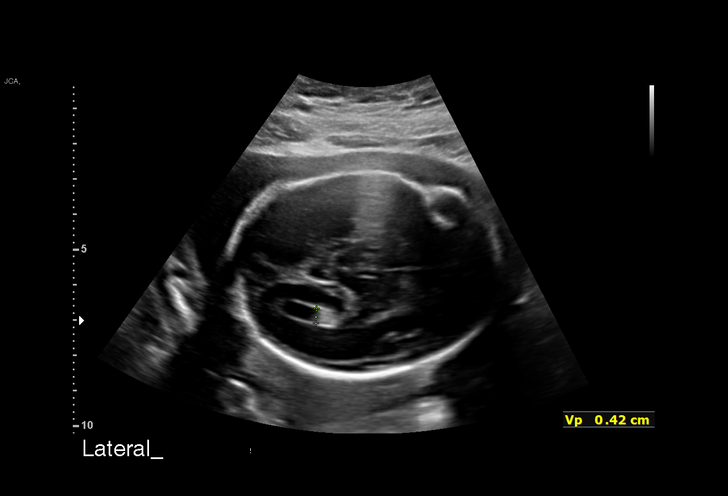
[im 17/42]
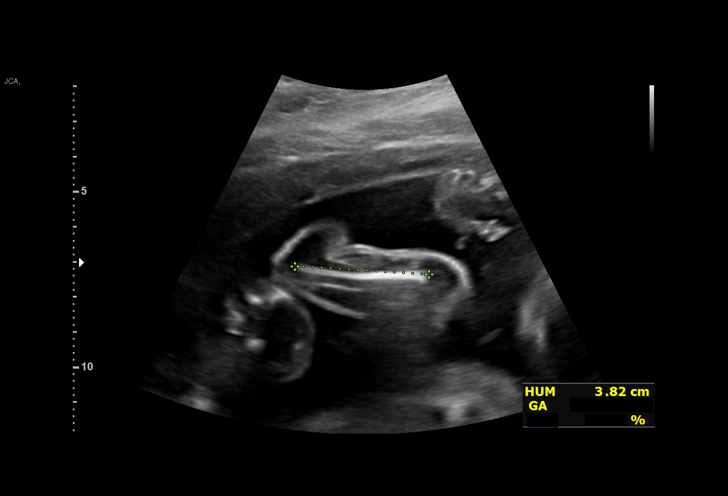
[im 20/42]
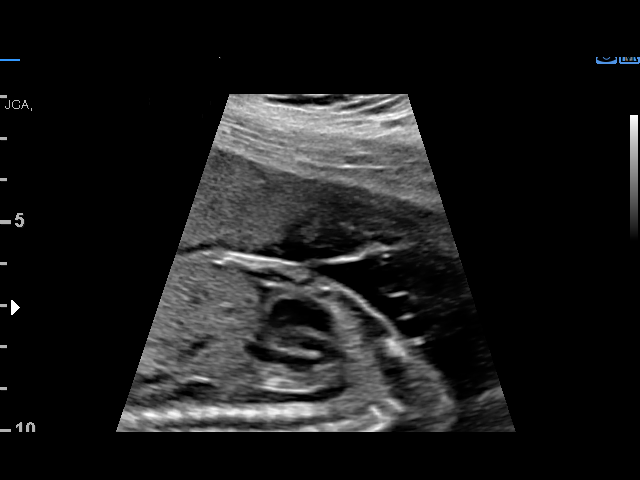
[im 23/42]
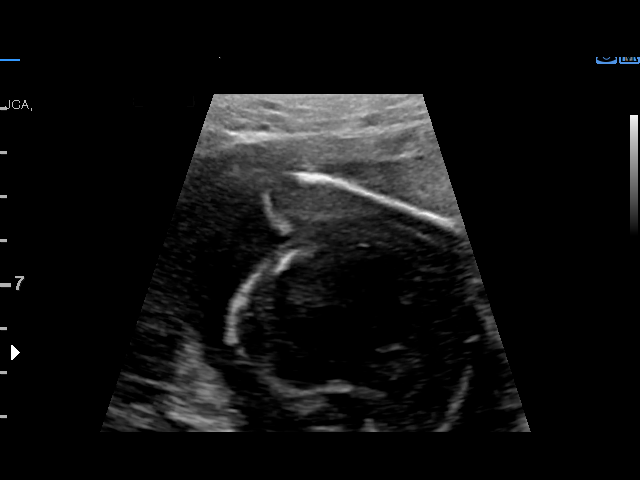
[im 26/42]
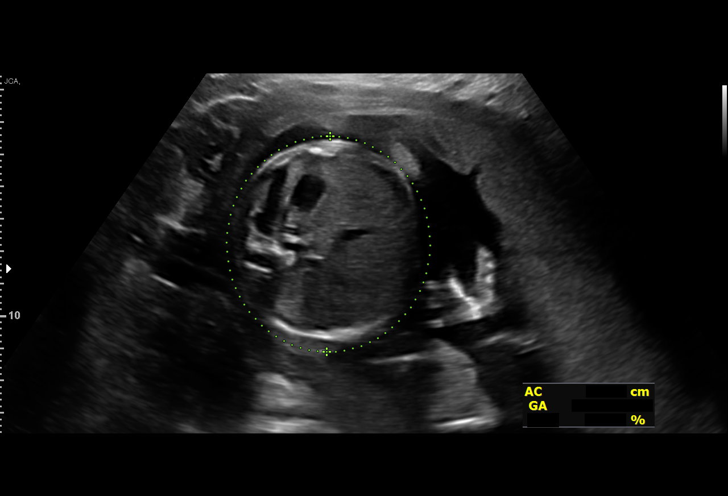
[im 29/42]
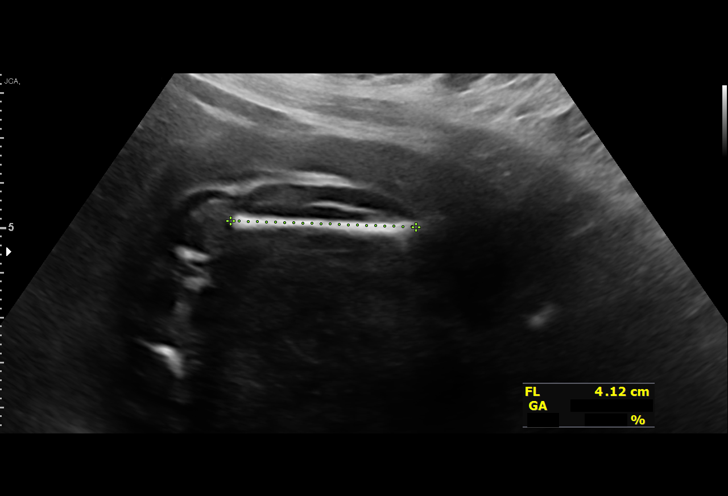
[im 32/42]
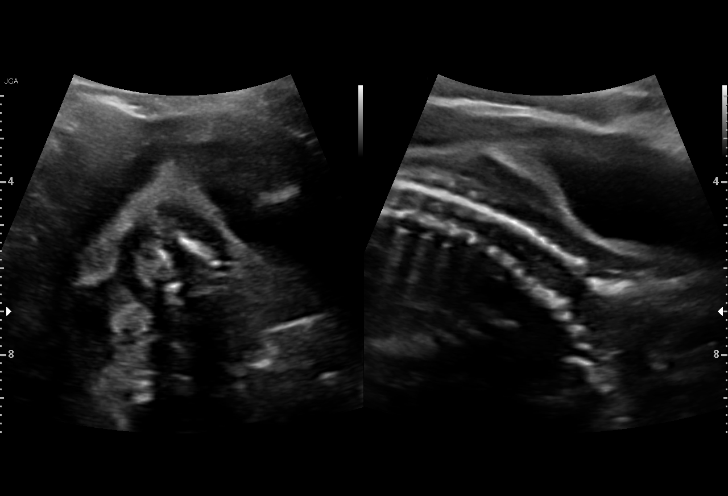
[im 35/42]
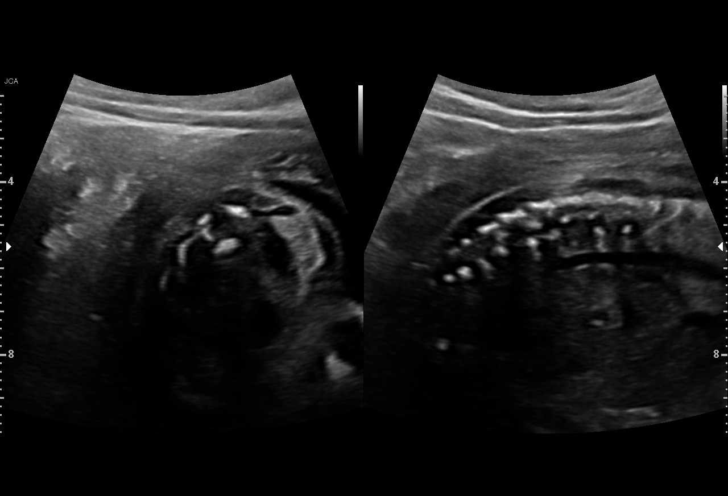
[im 38/42]
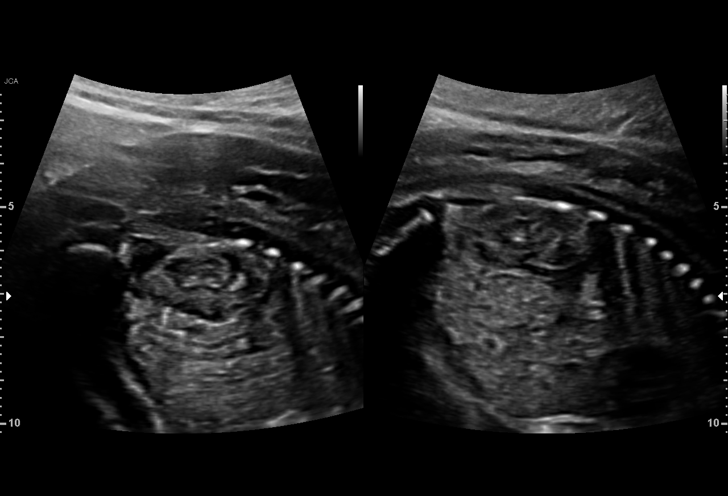
[im 42/42]
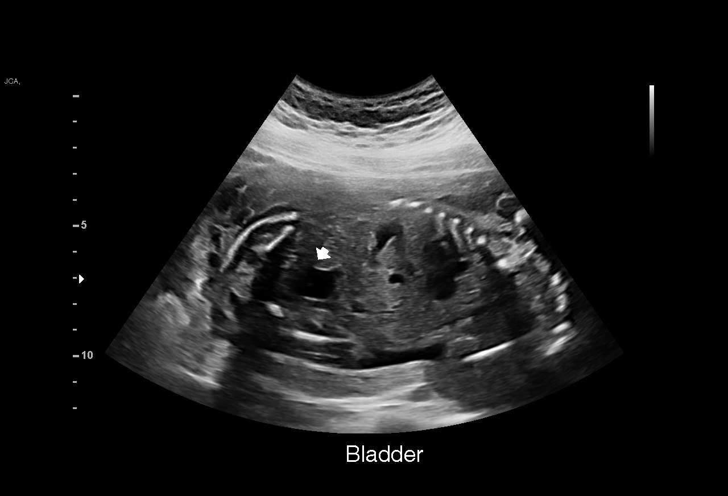

[14 of 28 positions shown; findings below may reference images not displayed]

[REDACTED]care

Indications

 Genetic carrier (Increased Risk SMA)
 Obesity complicating pregnancy, second
 trimester (BMI 32)
 23 weeks gestation of pregnancy
 Encounter for antenatal screening,
 unspecified
 Neg AFP
Fetal Evaluation

 Num Of Fetuses:         1
 Fetal Heart Rate(bpm):  153
 Cardiac Activity:       Observed
 Presentation:           Cephalic
 Placenta:               Anterior
 P. Cord Insertion:      Previously Visualized

 Amniotic Fluid
 AFI FV:      Within normal limits

                             Largest Pocket(cm)

Biometry

 BPD:      59.5  mm     G. Age:  24w 2d         80  %    CI:        71.21   %    70 - 86
                                                         FL/HC:      18.3   %    19.2 -
 HC:      224.6  mm     G. Age:  24w 3d         80  %    HC/AC:      1.12        1.05 -
 AC:      199.7  mm     G. Age:  24w 4d         81  %    FL/BPD:     68.9   %    71 - 87
 FL:         41  mm     G. Age:  23w 2d         38  %    FL/AC:      20.5   %    20 - 24
 HUM:      38.8  mm     G. Age:  23w 6d         53  %

 LV:        4.2  mm
 Est. FW:     661  gm      1 lb 7 oz     81  %
Gestational Age

 Clinical EDD:  23w 2d                                        EDD:   03/13/21
 U/S Today:     24w 1d                                        EDD:   03/07/21
 Best:          23w 2d     Det. By:  Clinical EDD             EDD:   03/13/21
Anatomy

 Cranium:               Appears normal         LVOT:                   Previously seen
 Cavum:                 Previously seen        Aortic Arch:            Appears normal
 Ventricles:            Appears normal         Ductal Arch:            Previously seen
 Choroid Plexus:        Previously seen        Diaphragm:              Appears normal
 Cerebellum:            Previously seen        Stomach:                Appears normal, left
                                                                       sided
 Posterior Fossa:       Previously seen        Abdomen:                Previously seen
 Nuchal Fold:           Previously seen        Abdominal Wall:         Previously seen
 Face:                  Orbits and profile     Cord Vessels:           Previously seen
                        previously seen
 Lips:                  Previously seen        Kidneys:                Appear normal
 Palate:                Previously seen        Bladder:                Appears normal
 Thoracic:              Previously seen        Spine:                  Appears normal
 Heart:                 Appears normal         Upper Extremities:      Previously seen
                        (4CH, axis, and
                        situs)
 RVOT:                  Previously seen        Lower Extremities:      Previously seen
Cervix Uterus Adnexa

 Cervix
 Length:           4.82  cm.
 Normal appearance by transabdominal scan.
Impression

 Patient returned for completion of fetal anatomy .Fetal
 biometry is consistent with her previously-established dates
 .Amniotic fluid is normal and good fetal activity is seen .Fetal
 anatomical survey was completed and appears normal.

 Patient has an increased carrier risk for Spinal Muscular
 Atrophy and met with our genetic counselor today. You will be
 receiving a separate letter from her.

Recommendations

 Follow-up scans as clinically indicated.
                 Rismondo, Denisivana

## 2021-07-26 DIAGNOSIS — L739 Follicular disorder, unspecified: Secondary | ICD-10-CM | POA: Diagnosis not present

## 2021-07-26 DIAGNOSIS — N760 Acute vaginitis: Secondary | ICD-10-CM | POA: Diagnosis not present

## 2021-07-26 DIAGNOSIS — Z113 Encounter for screening for infections with a predominantly sexual mode of transmission: Secondary | ICD-10-CM | POA: Diagnosis not present

## 2021-08-06 IMAGING — US US MFM OB FOLLOW-UP
1 series · 14 of 28 positions shown · non-contrast
Comparison: none

[Series 1: us mfm ob follow-up · 43 acquisitions, 14 frames shown]
[im 2/43]
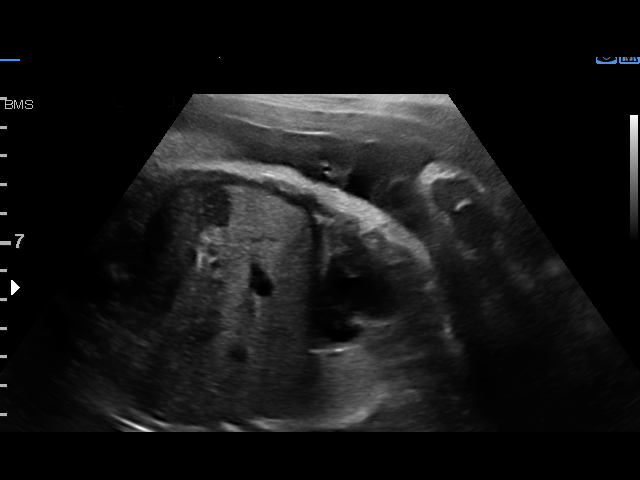
[im 5/43]
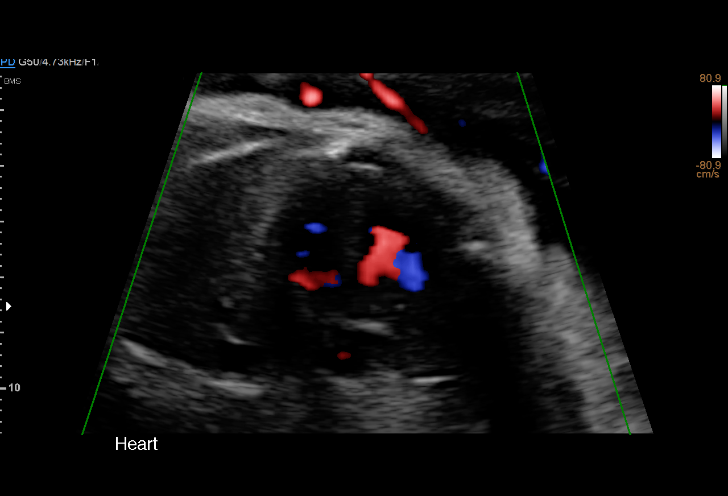
[im 8/43]
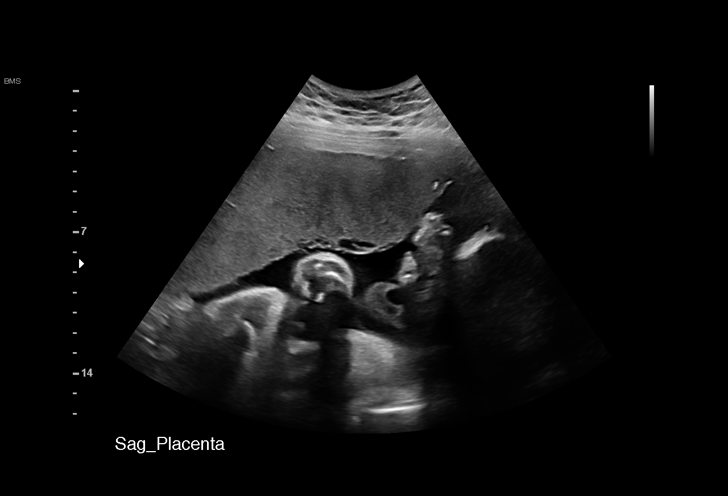
[im 11/43]
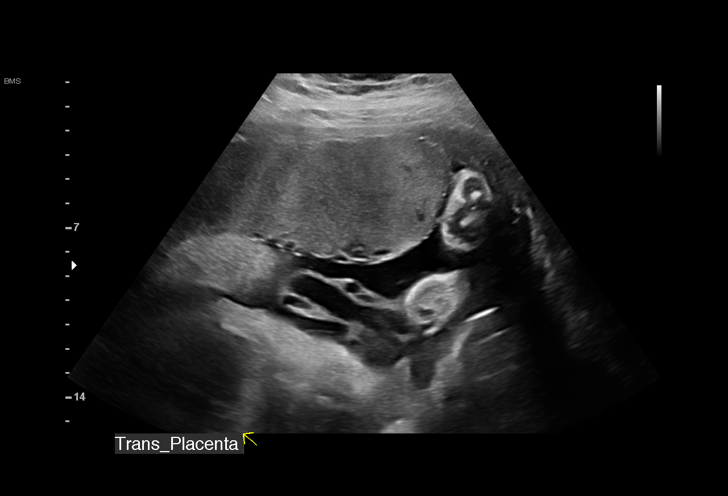
[im 15/43]
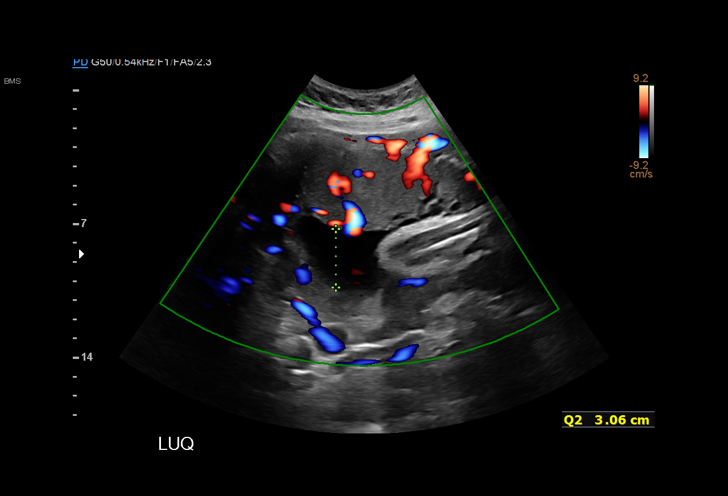
[im 18/43]
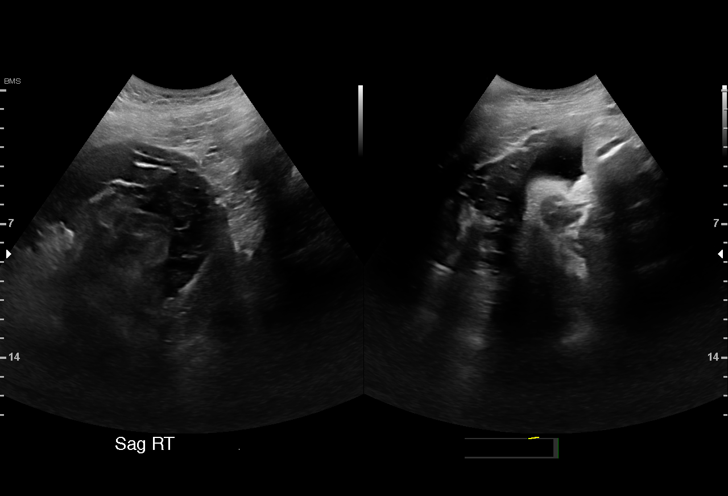
[im 21/43]
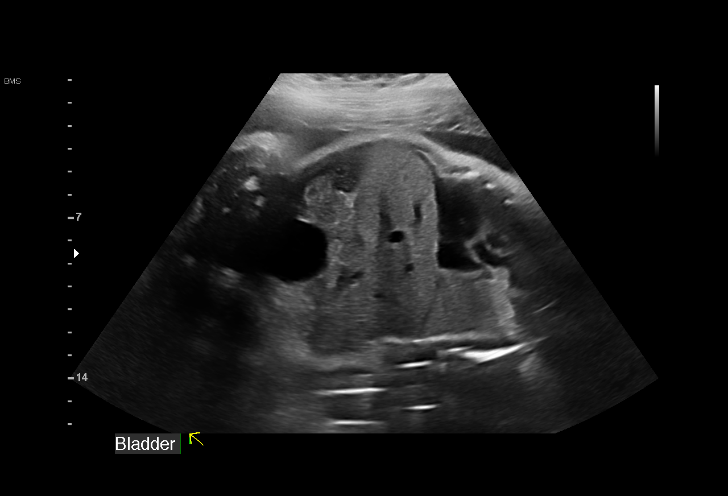
[im 24/43]
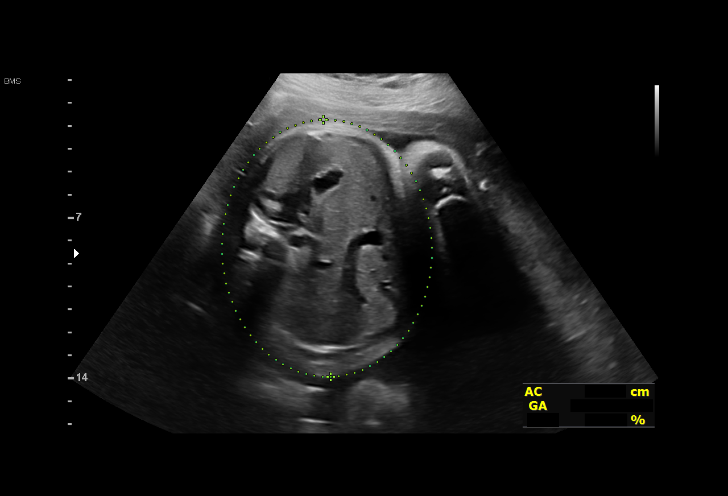
[im 27/43]
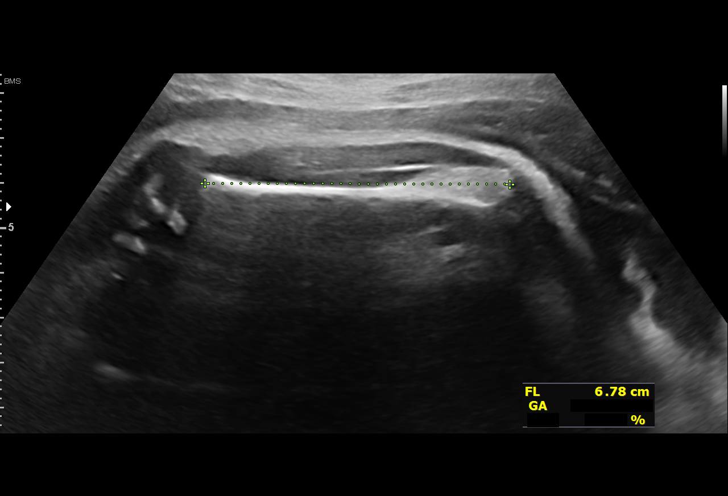
[im 30/43]
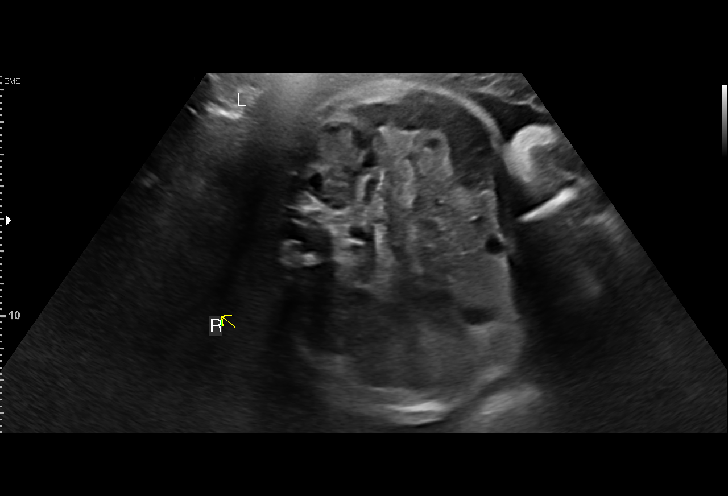
[im 33/43]
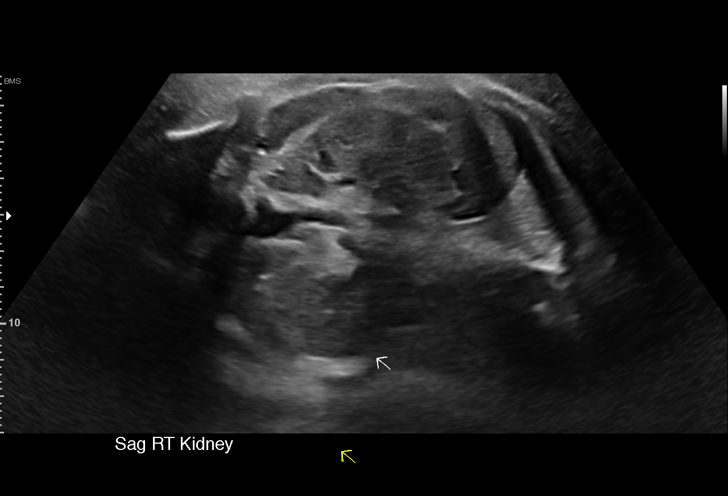
[im 36/43]
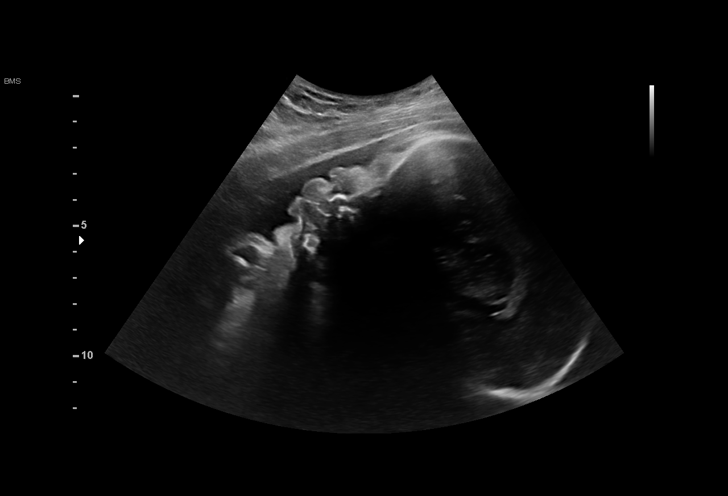
[im 39/43]
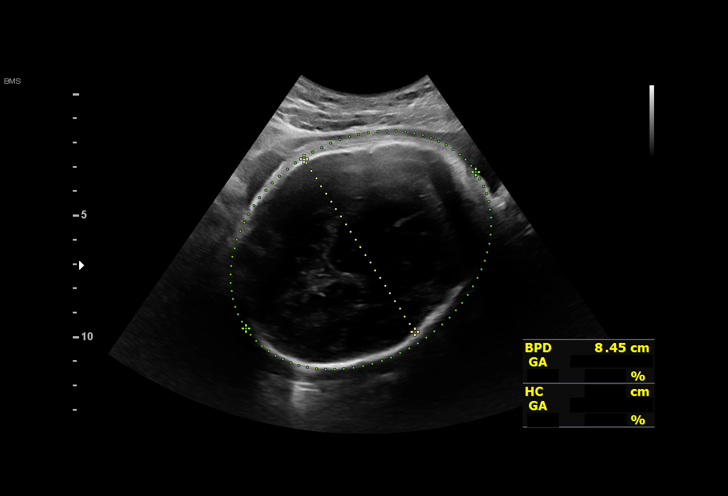
[im 43/43]
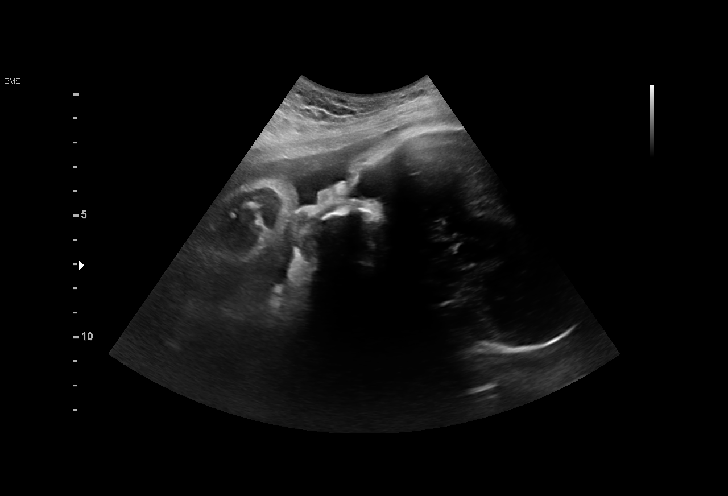

[14 of 28 positions shown; findings below may reference images not displayed]

[REDACTED]care

Indications

 Gestational diabetes in pregnancy, diet
 controlled
 Genetic carrier (Increased Risk SMA)
 Obesity complicating pregnancy, second
 trimester (BMI 32)
 Encounter for antenatal screening,
 unspecified
 Neg AFP
 34 weeks gestation of pregnancy
Fetal Evaluation

 Num Of Fetuses:         1
 Fetal Heart Rate(bpm):  137
 Cardiac Activity:       Observed
 Presentation:           Cephalic
 Placenta:               Anterior
 P. Cord Insertion:      Visualized, central

 Amniotic Fluid
 AFI FV:      Within normal limits

 AFI Sum(cm)     %Tile       Largest Pocket(cm)
 9.45            16

 RUQ(cm)       RLQ(cm)       LUQ(cm)        LLQ(cm)

Biometry

 BPD:      84.9  mm     G. Age:  34w 1d         39  %    CI:        72.28   %    70 - 86
                                                         FL/HC:      21.0   %    20.1 -
 HC:      317.7  mm     G. Age:  35w 5d         44  %    HC/AC:      0.99        0.93 -
 AC:      320.3  mm     G. Age:  36w 0d         88  %    FL/BPD:     78.4   %    71 - 87
 FL:       66.6  mm     G. Age:  34w 2d         33  %    FL/AC:      20.8   %    20 - 24
 LV:        4.6  mm

 Est. FW:    7733  gm    5 lb 13 oz      67  %
Gestational Age

 Clinical EDD:  34w 4d                                        EDD:   03/13/21
 U/S Today:     35w 0d                                        EDD:   03/10/21
 Best:          34w 4d     Det. By:  Clinical EDD             EDD:   03/13/21
Anatomy

 Cranium:               Previously seen        LVOT:                   Previously seen
 Cavum:                 Previously seen        Aortic Arch:            Previously seen
 Ventricles:            Appears normal         Ductal Arch:            Previously seen
 Choroid Plexus:        Previously seen        Diaphragm:              Appears normal
 Cerebellum:            Previously seen        Stomach:                Appears normal, left
                                                                       sided
 Posterior Fossa:       Previously seen        Abdomen:                Previously seen
 Nuchal Fold:           Previously seen        Abdominal Wall:         Previously seen
 Face:                  Orbits and profile     Cord Vessels:           Previously seen
                        previously seen
 Lips:                  Previously seen        Kidneys:                Appear normal
 Palate:                Previously seen        Bladder:                Appears normal
 Thoracic:              Previously seen        Spine:                  Previously seen
 Heart:                 Appears normal         Upper Extremities:      Previously seen
                        (4CH, axis, and
                        situs)
 RVOT:                  Previously seen        Lower Extremities:      Previously seen
Cervix Uterus Adnexa

 Cervix
 Not visualized (advanced GA >21wks)

 Uterus
 No abnormality visualized.

 Right Ovary
 Not visualized.

 Left Ovary
 Not visualized.

 Cul De Sac
 No free fluid seen.

 Adnexa
 No adnexal mass visualized.
Impression

 Gestational diabetes. Well-controlled on diet .
 Fetal growth is appropriate for gestational age .Amniotic fluid
 is normal and good fetal activity is seen .Cephalic
 presentation.
Recommendations

 Follow-up scans as clinically indicated.
                 Tamami Inuca, Gonzalo E

## 2022-01-07 ENCOUNTER — Other Ambulatory Visit: Payer: Self-pay

## 2022-01-07 ENCOUNTER — Emergency Department (HOSPITAL_COMMUNITY)
Admission: EM | Admit: 2022-01-07 | Discharge: 2022-01-07 | Disposition: A | Discharge status: Home / Self Care | Payer: 59 | Attending: Emergency Medicine | Admitting: Emergency Medicine

## 2022-01-07 ENCOUNTER — Encounter (HOSPITAL_COMMUNITY): Payer: Self-pay

## 2022-01-07 DIAGNOSIS — N6313 Unspecified lump in the right breast, lower outer quadrant: Secondary | ICD-10-CM | POA: Diagnosis not present

## 2022-01-07 DIAGNOSIS — N63 Unspecified lump in unspecified breast: Secondary | ICD-10-CM

## 2022-01-07 NOTE — ED Provider Notes (Signed)
? ?[facility] EMERGENCY DEPARTMENT  ?Provider Note ? ?CSN: [id number] ?Arrival date & time: 01/07/22 1248 ? ?History ?No chief complaint on file. ? ? ?Ebony Snyder is a 26 y.o. female who presents today with a lump just below her right breast. She states it is painful. Started 2 days ago. No palliating or provoking factors. Had a similar lump about 2 months ago. It resolved spontaneously. Lump is tender to the touch. No other nodules noticed. No redness, warmth. No swelling. No discharge from nipple or breast tenderness.  ? ? ?Home Medications ?Prior to Admission medications   ?Medication Sig Start Date End Date Taking? Authorizing Provider  ?amLODipine (NORVASC) 10 MG tablet Take 1 tablet (10 mg total) by mouth daily. ?Patient not taking: Reported on 04/05/2021 02/23/21   Jovita Kussmaul, MD  ?coconut oil OIL Apply 1 application topically as needed. ?Patient not taking: No sig reported 02/23/21   Jovita Kussmaul, MD  ?ferrous sulfate 325 (65 FE) MG tablet Take 1 tablet (325 mg total) by mouth every other day. ?Patient not taking: Reported on 04/05/2021 02/24/21   Jovita Kussmaul, MD  ?ibuprofen (ADVIL) 800 MG tablet Take 1 tablet (800 mg total) by mouth every 6 (six) hours. ?Patient not taking: Reported on 04/05/2021 02/23/21   Jovita Kussmaul, MD  ?oxyCODONE (OXY IR/ROXICODONE) 5 MG immediate release tablet Take 1-2 tablets (5-10 mg total) by mouth every 4 (four) hours as needed for moderate pain. ?Patient not taking: Reported on 04/05/2021 02/23/21   Jovita Kussmaul, MD  ?Prenat-FeAsp-Meth-FA-DHA w/o A (PRENATE PIXIE) 10-0.6-0.4-200 MG CAPS Take 1 capsule by mouth daily. ?Patient not taking: Reported on 04/05/2021 08/31/20   Warden Fillers, MD  ?simethicone (MYLICON) 80 MG chewable tablet Chew 1 tablet (80 mg total) by mouth as needed for flatulence. ?Patient not taking: Reported on 04/05/2021 02/23/21   Jovita Kussmaul, MD  ?albuterol (PROVENTIL HFA;VENTOLIN HFA) 108 (90 Base) MCG/ACT inhaler Inhale 2 puffs into  the lungs every 4 (four) hours as needed for wheezing or shortness of breath (cough, shortness of breath or wheezing.). ?Patient not taking: Reported on 06/02/2018 08/23/16 10/20/19  Elvina Sidle, MD  ? ? ? ?Allergies    ?Patient has no known allergies. ? ? ?Review of Systems   ?Review of Systems  ?Constitutional:  Negative for chills and fever.  ?HENT:  Negative for ear pain and sore throat.   ?Eyes:  Negative for pain and visual disturbance.  ?Respiratory:  Negative for cough and shortness of breath.   ?Cardiovascular:  Negative for chest pain and palpitations.  ?Gastrointestinal:  Negative for abdominal pain and vomiting.  ?Genitourinary:  Negative for dysuria and hematuria.  ?Musculoskeletal:  Negative for arthralgias and back pain.  ?Skin:  Negative for color change and rash.  ?Neurological:  Negative for seizures and syncope.  ?All other systems reviewed and are negative. ?Please see HPI for pertinent positives and negatives ? ?Physical Exam ?BP 120/70 (BP Location: Right Arm)   Pulse 80   Temp 98.3 ?F (36.8 ?C) (Oral)   Resp 15   SpO2 99%  ? ?Physical Exam ?Vitals and nursing note reviewed.  ?Constitutional:   ?   General: She is not in acute distress. ?   Appearance: She is well-developed.  ?HENT:  ?   Head: Normocephalic and atraumatic.  ?Eyes:  ?   Conjunctiva/sclera: Conjunctivae normal.  ?Cardiovascular:  ?   Rate and Rhythm: Normal rate and regular rhythm.  ?   Heart sounds: No murmur heard. ?Pulmonary:  ?  Effort: Pulmonary effort is normal. No respiratory distress.  ?   Breath sounds: Normal breath sounds.  ?Abdominal:  ?   Palpations: Abdomen is soft.  ?   Tenderness: There is no abdominal tenderness.  ?Musculoskeletal:     ?   General: No swelling.  ?   Cervical back: Neck supple.  ?Skin: ?   General: Skin is warm and dry.  ?   Capillary Refill: Capillary refill takes less than 2 seconds.  ?   Comments: Small pea-sized nodule to the underside of the right breast near the inframammary fold.    ?Neurological:  ?   Mental Status: She is alert.  ?Psychiatric:     ?   Mood and Affect: Mood normal.  ? ? ?ED Results / Procedures / Treatments   ?EKG ?None ? ?Procedures ?Procedures ? ?Medications Ordered in the ED ?Medications - No data to display ? ? ?ED Course  ? ?  ? ? ?MDM  ? ?This patient presents to the ED for concern of breast lump, this involves an extensive number of treatment options, and is a complaint that carries with it a high risk of complications and morbidity.  The differential diagnosis includes fibrocystic changes, breast nodule, abscess.  ? ?Additional history obtained: ?Records reviewed previous admission documents, Care Everywhere/External Records, and Primary Care Documents ? ?Medical Decision Making: Patient here with small nodule to the underside of her right breast. No surrounding warmth or erythema. No other swelling. No breast tenderness. No discharge from the nipple. [place] was performed at the bedside and there was no fluid collection or cobblestoning. Discussed that there does not appear to be signs of infection/abscess that would warrant further evaluation at this time. We discussed that it could be related to her menstrual cycle or fibrocystic changes. We did discuss follow up with PCP and potential undergoing outpatient imaging. Patient stated she will follow up with PCP. Hemodynamically stable here in the ED. Safe for discharge home at this time.  ? ?Complexity of problems addressed: ?Patient?s presentation is most consistent with  acute, uncomplicated illness ? ?Disposition: ?After consideration of the diagnostic results and the patient?s response to treatment,  ?I feel that the patent would benefit from discharge home .  ? ?Patient seen in conjunction with my attending, Dr. Adela Lank. ? ? ? ?Final Clinical Impression(s) / ED Diagnoses ?Final diagnoses:  ?Breast nodule  ? ? ?Rx / DC Orders ?ED Discharge Orders   ? ? None  ? ?  ? ? ?  ?Edison Simon, MD ?01/07/22 1730 ? ?  ?Melene Plan, DO ?01/07/22 1734 ? ?

## 2022-01-07 NOTE — ED Notes (Signed)
Patient very upset and anxious. Still waiting on provider to be evaluated. Patient asking nurse if she should go to UC and be seen. RN notified EDP and states will be seen shortly.  ?

## 2022-01-07 NOTE — ED Triage Notes (Signed)
Patient complains of painful area under right breast x 2 days, redness noted and denies drainage, denies fever ?

## 2022-01-08 ENCOUNTER — Telehealth: Payer: Self-pay

## 2022-01-08 NOTE — Telephone Encounter (Signed)
Transition Care Management Unsuccessful Follow-up Telephone Call ? ?Date of discharge and from where:  01/07/2022-[facility]  ? ?Attempts:  1st Attempt ? ?Reason for unsuccessful TCM follow-up call:  Left voice message ? ?  ?

## 2022-01-09 NOTE — Telephone Encounter (Signed)
Transition Care Management Unsuccessful Follow-up Telephone Call ? ?Date of discharge and from where:  01/07/2022-[place]  ? ?Attempts:  2nd Attempt ? ?Reason for unsuccessful TCM follow-up call:  Left voice message ? ?  ?

## 2022-01-10 ENCOUNTER — Other Ambulatory Visit: Payer: Self-pay | Admitting: *Deleted

## 2022-01-10 ENCOUNTER — Other Ambulatory Visit: Payer: Self-pay

## 2022-01-10 ENCOUNTER — Ambulatory Visit
Admission: EM | Admit: 2022-01-10 | Discharge: 2022-01-10 | Discharge status: Against Medical Advice | Payer: Medicaid Other

## 2022-01-10 DIAGNOSIS — N63 Unspecified lump in unspecified breast: Secondary | ICD-10-CM

## 2022-01-10 NOTE — ED Notes (Signed)
Called pt in lobby. No response.  °

## 2022-01-10 NOTE — Telephone Encounter (Signed)
Transition Care Management Unsuccessful Follow-up Telephone Call ? ?Date of discharge and from where:  01/07/2022 from [facility] ? ?Attempts:  3rd Attempt ? ?Reason for unsuccessful TCM follow-up call:  Unable to reach patient ? ? ? ?

## 2022-01-11 ENCOUNTER — Encounter: Payer: Self-pay | Admitting: Nurse Practitioner

## 2022-01-11 ENCOUNTER — Ambulatory Visit (INDEPENDENT_AMBULATORY_CARE_PROVIDER_SITE_OTHER): Payer: 59 | Admitting: Nurse Practitioner

## 2022-01-11 VITALS — BP 144/64 | HR 90 | Temp 98.2°F | Resp 20 | Ht 63.0 in | Wt 181.4 lb

## 2022-01-11 DIAGNOSIS — N6314 Unspecified lump in the right breast, lower inner quadrant: Secondary | ICD-10-CM

## 2022-01-11 MED ORDER — CEPHALEXIN 500 MG PO CAPS: 500.0000 mg | ORAL_CAPSULE | Freq: Two times a day (BID) | ORAL | 0 refills | Status: AC

## 2022-01-11 NOTE — Patient Instructions (Signed)
1. Lump in lower inner quadrant of right breast ? ?- US BREAST COMPLETE UNI RIGHT INC AXILLA ?- cephALEXin (KEFLEX) 500 MG capsule; Take 1 capsule (500 mg total) by mouth 2 (two) times daily for 10 days.  Dispense: 20 capsule; Refill: 0 ? ?Concerned for right breast lump vs abscess ? ?Will order [place] through breast center ? ?Follow up: ? ?Follow up in 4- 6 weeks with Dr. Andrey Campanile or Amy ? ?  ?

## 2022-01-11 NOTE — Progress Notes (Signed)
? ?@Patient  ID: , female    DOB: December 25, 1995, 26 y.o.   MRN: [id number] ? ?Chief Complaint  ?Patient presents with  ? Establish Care  ?  Pt stated she is here for a lump in right breast said it is painful also pt menstrual cycle is off she is having 2 in a month  ? ? ?Referring provider: ?No ref. provider found ? ?HPI ? ?Patient presents today for a lump to her left breast.  She states that this is painful and has been there for the past 2 months.  She states that the area is tender that her right lower inner breast.  Patient has also been having some issues with her menstrual cycle coming more often that she is expecting it. Denies f/c/s, n/v/d, hemoptysis, PND, chest pain or edema. ? ? ? ? ? ? ?No Known Allergies ? ?Immunization History  ?Administered Date(s) Administered  ? Tdap 12/14/2020  ? ? ?Past Medical History:  ?Diagnosis Date  ? Anxiety   ? Concussion   ? Depression   ? denies  ? Diabetes mellitus without complication (HCC)   ? Dysmenorrhea   ? Migraine without aura   ? STD (sexually transmitted disease)   ? ? ?Tobacco History: ?Social History  ? ?Tobacco Use  ?Smoking Status Never  ?Smokeless Tobacco Never  ? ?Counseling given: Not Answered ? ? ?Outpatient Encounter Medications as of 01/11/2022  ?Medication Sig  ? cephALEXin (KEFLEX) 500 MG capsule Take 1 capsule (500 mg total) by mouth 2 (two) times daily for 10 days.  ? amLODipine (NORVASC) 10 MG tablet Take 1 tablet (10 mg total) by mouth daily. (Patient not taking: Reported on 04/05/2021)  ? coconut oil OIL Apply 1 application topically as needed. (Patient not taking: Reported on 02/28/2021)  ? ferrous sulfate 325 (65 FE) MG tablet Take 1 tablet (325 mg total) by mouth every other day. (Patient not taking: Reported on 04/05/2021)  ? ibuprofen (ADVIL) 800 MG tablet Take 1 tablet (800 mg total) by mouth every 6 (six) hours. (Patient not taking: Reported on 04/05/2021)  ? oxyCODONE (OXY IR/ROXICODONE) 5 MG immediate release tablet Take 1-2 tablets  (5-10 mg total) by mouth every 4 (four) hours as needed for moderate pain. (Patient not taking: Reported on 04/05/2021)  ? Prenat-FeAsp-Meth-FA-DHA w/o A (PRENATE PIXIE) 10-0.6-0.4-200 MG CAPS Take 1 capsule by mouth daily. (Patient not taking: Reported on 04/05/2021)  ? simethicone (MYLICON) 80 MG chewable tablet Chew 1 tablet (80 mg total) by mouth as needed for flatulence. (Patient not taking: Reported on 04/05/2021)  ? [DISCONTINUED] albuterol (PROVENTIL HFA;VENTOLIN HFA) 108 (90 Base) MCG/ACT inhaler Inhale 2 puffs into the lungs every 4 (four) hours as needed for wheezing or shortness of breath (cough, shortness of breath or wheezing.). (Patient not taking: Reported on 06/02/2018)  ? ?No facility-administered encounter medications on file as of 01/11/2022.  ? ? ? ?Review of Systems ? ?Review of Systems  ?Skin:   ?     Lump to right breast   ? ? ? ?Physical Exam ? ?BP (!) 144/64   Pulse 90   Temp 98.2 ?F (36.8 ?C)   Resp 20   Ht 5\' 3"  (1.6 m)   Wt 181 lb 6.4 oz (82.3 kg)   LMP 12/25/2021 (Approximate)   SpO2 98%   BMI 32.13 kg/m?  ? ?Wt Readings from Last 5 Encounters:  ?01/11/22 181 lb 6.4 oz (82.3 kg)  ?04/05/21 187 lb (84.8 kg)  ?02/28/21 186 lb (84.4  kg)  ?02/25/21 191 lb (86.6 kg)  ?02/20/21 193 lb 9.6 oz (87.8 kg)  ? ? ? ?Physical Exam ?Vitals and nursing note reviewed.  ?Constitutional:   ?   General: She is not in acute distress. ?   Appearance: She is well-developed.  ?Cardiovascular:  ?   Rate and Rhythm: Normal rate and regular rhythm.  ?Pulmonary:  ?   Effort: Pulmonary effort is normal.  ?   Breath sounds: Normal breath sounds.  ?Chest:  ?Breasts: ?   Right: Mass and tenderness present. No bleeding, inverted nipple, nipple discharge or skin change.  ? ? ?Neurological:  ?   Mental Status: She is alert and oriented to person, place, and time.  ? ? ? ?Lab Results: ? ?CBC ?   ?Component Value Date/Time  ? WBC 14.3 (H) 02/25/2021 1817  ? RBC 2.74 (L) 02/25/2021 1817  ? HGB 7.9 (L) 02/25/2021 1817  ? HGB  10.8 (L) 02/14/2021 1508  ? HCT 23.6 (L) 02/25/2021 1817  ? HCT 31.9 (L) 02/14/2021 1508  ? PLT 337 02/25/2021 1817  ? PLT 237 02/14/2021 1508  ? MCV 86.1 02/25/2021 1817  ? MCV 88 02/14/2021 1508  ? MCH 28.8 02/25/2021 1817  ? MCHC 33.5 02/25/2021 1817  ? RDW 13.5 02/25/2021 1817  ? RDW 13.3 02/14/2021 1508  ? LYMPHSABS 1.8 08/24/2020 1536  ? MONOABS 0.6 08/18/2019 2031  ? EOSABS 0.1 08/24/2020 1536  ? BASOSABS 0.0 08/24/2020 1536  ? ? ?BMET ?   ?Component Value Date/Time  ? NA 137 02/23/2021 0515  ? NA 141 02/14/2021 1508  ? K 3.4 (L) 02/23/2021 0515  ? CL 108 02/23/2021 0515  ? CO2 25 02/23/2021 0515  ? GLUCOSE 61 (L) 02/23/2021 0515  ? BUN 11 02/23/2021 0515  ? BUN 2 (L) 02/14/2021 1508  ? CREATININE 1.00 02/23/2021 0515  ? CALCIUM 8.2 (L) 02/23/2021 0515  ? GFRNONAA >60 02/23/2021 0515  ? GFRAA 151 12/28/2020 1104  ? ? ?BNP ?No results found for: BNP ? ?ProBNP ?No results found for: PROBNP ? ?Imaging: ?No results found. ? ? ?Assessment & Plan:  ? ?Lump in lower inner quadrant of right breast ?Will order [place] and antibiotic (concerned for underlying infection) ? ?- US BREAST COMPLETE UNI RIGHT INC AXILLA ?- cephALEXin (KEFLEX) 500 MG capsule; Take 1 capsule (500 mg total) by mouth 2 (two) times daily for 10 days.  Dispense: 20 capsule; Refill: 0 ? ?Concerned for right breast lump vs abscess ? ?Will order [place] through breast center ? ?Follow up: ? ?Follow up in 4- 6 weeks with Dr. Andrey Campanile or Amy ? ? ? ? ?Ivonne Andrew, NP ?01/15/2022 ? ?

## 2022-01-15 ENCOUNTER — Encounter: Payer: Self-pay | Admitting: Nurse Practitioner

## 2022-01-15 DIAGNOSIS — N6314 Unspecified lump in the right breast, lower inner quadrant: Secondary | ICD-10-CM | POA: Insufficient documentation

## 2022-01-15 NOTE — Assessment & Plan Note (Signed)
Will order [place] and antibiotic (concerned for underlying infection) ? ?- US BREAST COMPLETE UNI RIGHT INC AXILLA ?- cephALEXin (KEFLEX) 500 MG capsule; Take 1 capsule (500 mg total) by mouth 2 (two) times daily for 10 days.  Dispense: 20 capsule; Refill: 0 ? ?Concerned for right breast lump vs abscess ? ?Will order [place] through breast center ? ?Follow up: ? ?Follow up in 4- 6 weeks with Dr. Andrey Campanile or Amy ?

## 2022-01-29 ENCOUNTER — Ambulatory Visit
Admission: RE | Admit: 2022-01-29 | Discharge: 2022-01-29 | Disposition: A | Discharge status: Home / Self Care | Payer: Medicaid Other | Source: Ambulatory Visit | Attending: Nurse Practitioner | Admitting: Nurse Practitioner

## 2022-02-21 ENCOUNTER — Ambulatory Visit (INDEPENDENT_AMBULATORY_CARE_PROVIDER_SITE_OTHER): Payer: Medicaid Other | Admitting: Family Medicine

## 2022-02-21 ENCOUNTER — Encounter: Payer: Self-pay | Admitting: Family Medicine

## 2022-02-21 VITALS — BP 113/75 | HR 61 | Temp 98.1°F | Resp 16 | Ht 62.0 in | Wt 178.6 lb

## 2022-02-21 DIAGNOSIS — Z113 Encounter for screening for infections with a predominantly sexual mode of transmission: Secondary | ICD-10-CM

## 2022-02-21 DIAGNOSIS — Z13220 Encounter for screening for lipoid disorders: Secondary | ICD-10-CM

## 2022-02-21 DIAGNOSIS — Z01419 Encounter for gynecological examination (general) (routine) without abnormal findings: Secondary | ICD-10-CM

## 2022-02-21 DIAGNOSIS — Z0000 Encounter for general adult medical examination without abnormal findings: Secondary | ICD-10-CM

## 2022-02-21 DIAGNOSIS — Z130 Encounter for screening for diseases of the blood and blood-forming organs and certain disorders involving the immune mechanism: Secondary | ICD-10-CM

## 2022-02-21 NOTE — Progress Notes (Signed)
Patient is here for complete physical examination. Patient would also like sexually transmitted disease testing. Patient c/o having a blood clot in her right eye x 3days. Patient would like to talk with provider about what to do. ? ?

## 2022-02-22 ENCOUNTER — Other Ambulatory Visit: Payer: Self-pay | Admitting: Family Medicine

## 2022-02-22 ENCOUNTER — Encounter: Payer: Self-pay | Admitting: Family Medicine

## 2022-02-22 DIAGNOSIS — Z13220 Encounter for screening for lipoid disorders: Secondary | ICD-10-CM

## 2022-02-22 DIAGNOSIS — Z0000 Encounter for general adult medical examination without abnormal findings: Secondary | ICD-10-CM

## 2022-02-22 DIAGNOSIS — Z1329 Encounter for screening for other suspected endocrine disorder: Secondary | ICD-10-CM

## 2022-02-22 DIAGNOSIS — Z130 Encounter for screening for diseases of the blood and blood-forming organs and certain disorders involving the immune mechanism: Secondary | ICD-10-CM

## 2022-02-22 NOTE — Addendum Note (Signed)
Addended by: Tommie Raymond on: 02/22/2022 03:31 PM ? ? Modules accepted: Orders ? ?

## 2022-02-22 NOTE — Progress Notes (Addendum)
?Established Patient Office Visit ? ?Subjective   ? ?Patient ID: Ebony Snyder, female    DOB: 05/05/1996  Age: 26 y.o. MRN: [id number] ? ?CC:  ?Chief Complaint  ?Patient presents with  ? SEXUALLY TRANSMITTED DISEASE  ? Annual Exam  ? ? ?HPI ?Cain Saupe presents for routine annual exam. ? ? ?Outpatient Encounter Medications as of 02/21/2022  ?Medication Sig  ? coconut oil OIL Apply 1 application topically as needed.  ? ferrous sulfate 325 (65 FE) MG tablet Take 1 tablet (325 mg total) by mouth every other day.  ? ibuprofen (ADVIL) 800 MG tablet Take 1 tablet (800 mg total) by mouth every 6 (six) hours.  ? oxyCODONE (OXY IR/ROXICODONE) 5 MG immediate release tablet Take 1-2 tablets (5-10 mg total) by mouth every 4 (four) hours as needed for moderate pain.  ? Prenat-FeAsp-Meth-FA-DHA w/o A (PRENATE PIXIE) 10-0.6-0.4-200 MG CAPS Take 1 capsule by mouth daily.  ? simethicone (MYLICON) 80 MG chewable tablet Chew 1 tablet (80 mg total) by mouth as needed for flatulence.  ? [DISCONTINUED] amLODipine (NORVASC) 10 MG tablet Take 1 tablet (10 mg total) by mouth daily.  ? [DISCONTINUED] albuterol (PROVENTIL HFA;VENTOLIN HFA) 108 (90 Base) MCG/ACT inhaler Inhale 2 puffs into the lungs every 4 (four) hours as needed for wheezing or shortness of breath (cough, shortness of breath or wheezing.). (Patient not taking: Reported on 06/02/2018)  ? ?No facility-administered encounter medications on file as of 02/21/2022.  ? ? ?Past Medical History:  ?Diagnosis Date  ? Anxiety   ? Concussion   ? Depression   ? denies  ? Diabetes mellitus without complication ([place])   ? Dysmenorrhea   ? Migraine without aura   ? STD (sexually transmitted disease)   ? ? ?Past Surgical History:  ?Procedure Laterality Date  ? CESAREAN SECTION N/A 02/21/2021  ? Procedure: CESAREAN SECTION;  Surgeon: Aletha Halim, MD;  Location: [facility] LD ORS;  Service: Obstetrics;  Laterality: N/A;  Arrest of Dilation  ? MOUTH SURGERY    ? NO PAST SURGERIES    ? WISDOM  TOOTH EXTRACTION    ? ? ?Family History  ?Problem Relation Age of Onset  ? Hypertension Mother   ? Hypertension Father   ? Asthma Sister   ? Diabetes Maternal Grandmother   ? Early death Maternal Grandmother   ? ? ?Social History  ? ?Socioeconomic History  ? Marital status: Single  ?  Spouse name: Not on file  ? Number of children: Not on file  ? Years of education: Not on file  ? Highest education level: Not on file  ?Occupational History  ? Not on file  ?Tobacco Use  ? Smoking status: Never  ? Smokeless tobacco: Never  ?Vaping Use  ? Vaping Use: Never used  ?Substance and Sexual Activity  ? Alcohol use: Not Currently  ?  Comment: occ  ? Drug use: No  ? Sexual activity: Yes  ?  Comment: condoms 50% of the time  ?Other Topics Concern  ? Not on file  ?Social History Narrative  ? Not on file  ? ?Social Determinants of Health  ? ?Financial Resource Strain: Not on file  ?Food Insecurity: Not on file  ?Transportation Needs: Not on file  ?Physical Activity: Not on file  ?Stress: Not on file  ?Social Connections: Not on file  ?Intimate Partner Violence: Not on file  ? ? ?Review of Systems  ?Eyes:  Positive for redness. Negative for blurred vision and pain.  ?All other  systems reviewed and are negative. ? ?  ? ? ?Objective   ? ?BP 113/75   Pulse 61   Temp 98.1 ?F (36.7 ?C) (Oral)   Resp 16   Ht 5' 2"  (1.575 m)   Wt 178 lb 9.6 oz (81 kg)   LMP 02/21/2022   SpO2 97%   BMI 32.67 kg/m?  ? ?Physical Exam ?Vitals and nursing note reviewed.  ?Constitutional:   ?   General: She is not in acute distress. ?HENT:  ?   Head: Normocephalic and atraumatic.  ?   Right Ear: Tympanic membrane, ear canal and external ear normal.  ?   Left Ear: Tympanic membrane, ear canal and external ear normal.  ?   Nose: Nose normal.  ?   Mouth/Throat:  ?   Mouth: Mucous membranes are moist.  ?   Pharynx: Oropharynx is clear.  ?Eyes:  ?   Conjunctiva/sclera:  ?   Right eye: Hemorrhage present.  ?   Pupils: Pupils are equal, round, and reactive to  light.  ?Neck:  ?   Thyroid: No thyromegaly.  ?Cardiovascular:  ?   Rate and Rhythm: Normal rate and regular rhythm.  ?   Heart sounds: Normal heart sounds. No murmur heard. ?Pulmonary:  ?   Effort: Pulmonary effort is normal. No respiratory distress.  ?   Breath sounds: Normal breath sounds.  ?Abdominal:  ?   General: There is no distension.  ?   Palpations: Abdomen is soft. There is no mass.  ?   Tenderness: There is no abdominal tenderness.  ?Genitourinary: ?   Comments: Deferred - on menses ?Musculoskeletal:     ?   General: Normal range of motion.  ?   Cervical back: Normal range of motion and neck supple.  ?Skin: ?   General: Skin is warm and dry.  ?Neurological:  ?   General: No focal deficit present.  ?   Mental Status: She is alert and oriented to person, place, and time.  ?Psychiatric:     ?   Mood and Affect: Mood normal.     ?   Behavior: Behavior normal.  ? ? ? ?  ? ?Assessment & Plan:  ? ?1. Annual physical exam ?Patient deferred labs and pap as she is on menses - - CMP14+EGFR ? ? ? ? ?No follow-ups on file.  ? ?Becky Sax, MD ? ? ?

## 2022-02-28 ENCOUNTER — Encounter: Payer: Self-pay | Admitting: Family Medicine

## 2022-02-28 ENCOUNTER — Ambulatory Visit (INDEPENDENT_AMBULATORY_CARE_PROVIDER_SITE_OTHER): Payer: Medicaid Other | Admitting: Family Medicine

## 2022-02-28 ENCOUNTER — Other Ambulatory Visit (HOSPITAL_COMMUNITY)
Admission: RE | Admit: 2022-02-28 | Discharge: 2022-02-28 | Disposition: A | Discharge status: Home / Self Care | Payer: Medicaid Other | Source: Ambulatory Visit | Attending: Family Medicine | Admitting: Family Medicine

## 2022-02-28 VITALS — BP 114/77 | HR 70 | Temp 98.1°F | Resp 16 | Wt 179.0 lb

## 2022-02-28 DIAGNOSIS — Z13220 Encounter for screening for lipoid disorders: Secondary | ICD-10-CM | POA: Diagnosis not present

## 2022-02-28 DIAGNOSIS — Z0000 Encounter for general adult medical examination without abnormal findings: Secondary | ICD-10-CM

## 2022-02-28 DIAGNOSIS — Z130 Encounter for screening for diseases of the blood and blood-forming organs and certain disorders involving the immune mechanism: Secondary | ICD-10-CM | POA: Diagnosis not present

## 2022-02-28 DIAGNOSIS — Z01419 Encounter for gynecological examination (general) (routine) without abnormal findings: Secondary | ICD-10-CM

## 2022-02-28 DIAGNOSIS — Z1329 Encounter for screening for other suspected endocrine disorder: Secondary | ICD-10-CM | POA: Diagnosis not present

## 2022-02-28 DIAGNOSIS — Z139 Encounter for screening, unspecified: Secondary | ICD-10-CM

## 2022-02-28 DIAGNOSIS — Z13228 Encounter for screening for other metabolic disorders: Secondary | ICD-10-CM

## 2022-02-28 NOTE — Progress Notes (Signed)
Patient is here for Gynecology exam ?Patient has no other concerns for provider today ?

## 2022-03-01 ENCOUNTER — Other Ambulatory Visit: Payer: Self-pay | Admitting: Family Medicine

## 2022-03-01 ENCOUNTER — Encounter: Payer: Self-pay | Admitting: Family Medicine

## 2022-03-01 LAB — CMP14+EGFR
ALT: 23 IU/L (ref 0–32)
AST: 19 IU/L (ref 0–40)
Albumin/Globulin Ratio: 1.6 (ref 1.2–2.2)
Albumin: 4 g/dL (ref 3.9–5.0)
Alkaline Phosphatase: 69 IU/L (ref 44–121)
BUN/Creatinine Ratio: 19 (ref 9–23)
BUN: 15 mg/dL (ref 6–20)
Bilirubin Total: 0.2 mg/dL (ref 0.0–1.2)
CO2: 22 mmol/L (ref 20–29)
Calcium: 9.2 mg/dL (ref 8.7–10.2)
Chloride: 106 mmol/L (ref 96–106)
Creatinine, Ser: 0.77 mg/dL (ref 0.57–1.00)
Globulin, Total: 2.5 g/dL (ref 1.5–4.5)
Glucose: 65 mg/dL — ABNORMAL LOW (ref 70–99)
Potassium: 4.3 mmol/L (ref 3.5–5.2)
Sodium: 143 mmol/L (ref 134–144)
Total Protein: 6.5 g/dL (ref 6.0–8.5)
eGFR: 108 mL/min/{1.73_m2} (ref >59)

## 2022-03-01 LAB — CBC WITH DIFFERENTIAL/PLATELET
Basophils Absolute: 0.1 10*3/uL (ref 0.0–0.2)
Basos: 1 %
EOS (ABSOLUTE): 0.2 10*3/uL (ref 0.0–0.4)
Eos: 3 %
Hematocrit: 37.4 % (ref 34.0–46.6)
Hemoglobin: 11.9 g/dL (ref 11.1–15.9)
Immature Grans (Abs): 0 10*3/uL (ref 0.0–0.1)
Immature Granulocytes: 0 %
Lymphocytes Absolute: 2.2 10*3/uL (ref 0.7–3.1)
Lymphs: 35 %
MCH: 28.9 pg (ref 26.6–33.0)
MCHC: 31.8 g/dL (ref 31.5–35.7)
MCV: 91 fL (ref 79–97)
Monocytes Absolute: 0.6 10*3/uL (ref 0.1–0.9)
Monocytes: 9 %
Neutrophils Absolute: 3.3 10*3/uL (ref 1.4–7.0)
Neutrophils: 52 %
Platelets: 253 10*3/uL (ref 150–450)
RBC: 4.12 x10E6/uL (ref 3.77–5.28)
RDW: 12.9 % (ref 11.7–15.4)
WBC: 6.4 10*3/uL (ref 3.4–10.8)

## 2022-03-01 LAB — CERVICOVAGINAL ANCILLARY ONLY
Bacterial Vaginitis (gardnerella): POSITIVE — AB
Candida Glabrata: NEGATIVE
Candida Vaginitis: NEGATIVE
Chlamydia: NEGATIVE
Comment: NEGATIVE
Comment: NEGATIVE
Comment: NEGATIVE
Comment: NEGATIVE
Comment: NEGATIVE
Comment: NORMAL
Neisseria Gonorrhea: NEGATIVE
Trichomonas: NEGATIVE

## 2022-03-01 LAB — LIPID PANEL
Chol/HDL Ratio: 3.4 ratio (ref 0.0–4.4)
Cholesterol, Total: 152 mg/dL (ref 100–199)
HDL: 45 mg/dL (ref >39)
LDL Chol Calc (NIH): 85 mg/dL (ref 0–99)
Triglycerides: 126 mg/dL (ref 0–149)
VLDL Cholesterol Cal: 22 mg/dL (ref 5–40)

## 2022-03-01 LAB — TSH: TSH: 0.438 u[IU]/mL — ABNORMAL LOW (ref 0.450–4.500)

## 2022-03-01 MED ORDER — METRONIDAZOLE 500 MG PO TABS: 500.0000 mg | ORAL_TABLET | Freq: Two times a day (BID) | ORAL | 0 refills | Status: AC

## 2022-03-01 NOTE — Addendum Note (Signed)
Addended by: Kieth Brightly on: 03/01/2022 04:50 PM ? ? Modules accepted: Orders ? ?

## 2022-03-01 NOTE — Progress Notes (Signed)
? ?Established Patient Office Visit ? ?Subjective   ? ?Patient ID: Ebony Snyder, female    DOB: 1996/07/24  Age: 26 y.o. MRN: [id number] ? ?CC:  ?Chief Complaint  ?Patient presents with  ? Gynecologic Exam  ? ? ?HPI ?Ebony Snyder presents for pap and lab work from previous physical exam.  ? ? ?Outpatient Encounter Medications as of 02/28/2022  ?Medication Sig  ? coconut oil OIL Apply 1 application topically as needed.  ? ferrous sulfate 325 (65 FE) MG tablet Take 1 tablet (325 mg total) by mouth every other day.  ? ibuprofen (ADVIL) 800 MG tablet Take 1 tablet (800 mg total) by mouth every 6 (six) hours.  ? oxyCODONE (OXY IR/ROXICODONE) 5 MG immediate release tablet Take 1-2 tablets (5-10 mg total) by mouth every 4 (four) hours as needed for moderate pain.  ? Prenat-FeAsp-Meth-FA-DHA w/o A (PRENATE PIXIE) 10-0.6-0.4-200 MG CAPS Take 1 capsule by mouth daily.  ? simethicone (MYLICON) 80 MG chewable tablet Chew 1 tablet (80 mg total) by mouth as needed for flatulence.  ? [DISCONTINUED] albuterol (PROVENTIL HFA;VENTOLIN HFA) 108 (90 Base) MCG/ACT inhaler Inhale 2 puffs into the lungs every 4 (four) hours as needed for wheezing or shortness of breath (cough, shortness of breath or wheezing.). (Patient not taking: Reported on 06/02/2018)  ? ?No facility-administered encounter medications on file as of 02/28/2022.  ? ? ?Past Medical History:  ?Diagnosis Date  ? Anxiety   ? Concussion   ? Depression   ? denies  ? Diabetes mellitus without complication ([place])   ? Dysmenorrhea   ? Migraine without aura   ? STD (sexually transmitted disease)   ? ? ?Past Surgical History:  ?Procedure Laterality Date  ? CESAREAN SECTION N/A 02/21/2021  ? Procedure: CESAREAN SECTION;  Surgeon: Aletha Halim, MD;  Location: [facility];  Service: Obstetrics;  Laterality: N/A;  Arrest of Dilation  ? MOUTH SURGERY    ? NO PAST SURGERIES    ? WISDOM TOOTH EXTRACTION    ? ? ?Family History  ?Problem Relation Age of Onset  ? Hypertension Mother   ?  Hypertension Father   ? Asthma Sister   ? Diabetes Maternal Grandmother   ? Early death Maternal Grandmother   ? ? ?Social History  ? ?Socioeconomic History  ? Marital status: Single  ?  Spouse name: Not on file  ? Number of children: Not on file  ? Years of education: Not on file  ? Highest education level: Not on file  ?Occupational History  ? Not on file  ?Tobacco Use  ? Smoking status: Never  ? Smokeless tobacco: Never  ?Vaping Use  ? Vaping Use: Never used  ?Substance and Sexual Activity  ? Alcohol use: Not Currently  ?  Comment: occ  ? Drug use: No  ? Sexual activity: Yes  ?  Comment: condoms 50% of the time  ?Other Topics Concern  ? Not on file  ?Social History Narrative  ? Not on file  ? ?Social Determinants of Health  ? ?Financial Resource Strain: Not on file  ?Food Insecurity: Not on file  ?Transportation Needs: Not on file  ?Physical Activity: Not on file  ?Stress: Not on file  ?Social Connections: Not on file  ?Intimate Partner Violence: Not on file  ? ? ?Review of Systems  ?All other systems reviewed and are negative. ? ?  ? ? ?Objective   ? ?BP 114/77   Pulse 70   Temp 98.1 ?F (36.7 ?C) (  Oral)   Resp 16   Wt 179 lb (81.2 kg)   LMP 02/21/2022   SpO2 96%   BMI 32.74 kg/m?  ? ?Physical Exam ?Vitals and nursing note reviewed.  ?Abdominal:  ?   Hernia: There is no hernia in the left inguinal area or right inguinal area.  ?Genitourinary: ?   Labia:     ?   Right: No lesion.     ?   Left: No lesion.   ?   Vagina: Normal.  ?   Cervix: Normal.  ?   Uterus: Normal.   ?   Adnexa: Right adnexa normal.  ?   Rectum: Normal.  ? ? ? ?  ? ?Assessment & Plan:  ? ?1. Pap smear, as part of routine gynecological examination ? ?- Cervicovaginal ancillary only ?- Cytology - PAP ? ?2. Screening for endocrine/metabolic/immunity disorders ? ?- TSH ? ?3. Screening for lipid disorders ? ?- Lipid Panel ? ?4. Screening for deficiency anemia ?- CBC with Differential ? ?5. Encounter for health-related screening ? ? ? ? ? ?No  follow-ups on file.  ? ?Becky Sax, MD ? ? ?

## 2022-03-01 NOTE — Progress Notes (Signed)
flag

## 2022-03-03 LAB — T4, FREE: Free T4: 1.11 ng/dL (ref 0.82–1.77)

## 2022-03-03 LAB — SPECIMEN STATUS REPORT

## 2022-03-06 LAB — CYTOLOGY - PAP
Comment: NEGATIVE
Diagnosis: UNDETERMINED — AB
High risk HPV: NEGATIVE

## 2022-05-18 ENCOUNTER — Ambulatory Visit: Payer: Self-pay | Admitting: *Deleted

## 2022-05-18 NOTE — Telephone Encounter (Signed)
Reason for Disposition  Change in shape or appearance of breast  Caller is uncertain what lesion is  Answer Assessment - Initial Assessment Questions 1. SYMPTOM: "What's the main symptom you're concerned about?"  (e.g., lump, pain, rash, nipple discharge)     Right Nipple is hard and painful with a knot on it for 3-4 days.   No drainage 2. LOCATION: "Where is the Rightnipple located?"     I feel a knot on the tip of my nipple. 3. ONSET: "When did pain start?"     3-4 days ago 4. PRIOR HISTORY: "Do you have any history of prior problems with your breasts?" (e.g., lumps, cancer, fibrocystic breast disease)     I had a cyst growing under my breast a couple of months ago.   I went to the breast  center and it was a cyst.  It went away on its own. 5. CAUSE: "What do you think is causing this symptom?"     I don't know 6. OTHER SYMPTOMS: "Do you have any other symptoms?" (e.g., fever, breast pain, redness or rash, nipple discharge)     No breast pain.   Just the knot on the end of my right nipple is hard and sore.  No drainage.   7. PREGNANCY-BREASTFEEDING: "Is there any chance you are pregnant?" "When was your last menstrual period?" "Are you breastfeeding?"     Not pregnant, not breastfeeding.  Answer Assessment - Initial Assessment Questions 1. APPEARANCE of LESION: "What does it look like?"      I have a hard knot on the tip of my right nipple 2. SIZE: "How big is it?" (e.g., inches, cm; or compare to size of pinhead, tip of pen, eraser, coin, pea, grape, ping pong ball)      Tip of my nipple 3. COLOR: "What color is it?" "Is there more than one color?"     Same color as my nipple 4. SHAPE: "What shape is it?" (e.g., round, irregular)     Hard knot 5. RAISED: "Does it stick up above the skin or is it flat?" (e.g., raised or elevated)     Raised up and sore 6. TENDER: "Does it hurt when you touch it?"  (Scale 1-10; or mild, moderate, severe)     Yes it's tender 7. LOCATION: "Where is it  located?"      Tip of right nipple 8. ONSET: "When did it first appear?"      3-4 days ago 9. NUMBER: "Is there just one?" or "Are there others?"     Just one 10. CAUSE: "What do you think it is?"       I don't know 11. OTHER SYMPTOMS: "Do you have any other symptoms?" (e.g., fever)       No drainage or bleeding. 12. PREGNANCY: "Is there any chance you are pregnant?" "When was your last menstrual period?"       Not pregnant.  Protocols used: Breast Symptoms-A-AH, Skin Lesion - Moles or Growths-A-AH  Chief Complaint: knot on tip of right nipple Symptoms: sore and hard Frequency: last 3-4 days Pertinent Negatives: Patient denies drainage Disposition: [] ED /[] Urgent Care (no appt availability in office) / [x] Appointment(In office/virtual)/ []  [facility]/ [] Home Care/ [] Refused Recommended Disposition /[] [facility]/ []  Follow-up with PCP Additional Notes: Appt made in Aug. With Dr. but pt is going on to urgent care.

## 2022-05-23 ENCOUNTER — Ambulatory Visit
Admission: EM | Admit: 2022-05-23 | Discharge: 2022-05-23 | Disposition: A | Discharge status: Home / Self Care | Payer: Medicaid Other

## 2022-05-23 DIAGNOSIS — N6341 Unspecified lump in right breast, subareolar: Secondary | ICD-10-CM

## 2022-05-23 NOTE — ED Provider Notes (Signed)
[facility]    CSN: [id number] Arrival date & time: 05/23/22  1738      History   Chief Complaint Chief Complaint  Patient presents with   Breast Mass    HPI Ebony Snyder is a 26 y.o. female.   Patient presents with lump surrounding nipple of right breast that is tender to touch that she noticed about 1 week ago.  Patient reports history of cyst in right breast in different location.  Denies nipple discharge or drainage from the mass.  Denies any injury to the breast.  Patient is not currently breast-feeding a child.  Denies fever, body aches, chills.     Past Medical History:  Diagnosis Date   Anxiety    Concussion    Depression    denies   Diabetes mellitus without complication (HCC)    Dysmenorrhea    Migraine without aura    STD (sexually transmitted disease)     Patient Active Problem List   Diagnosis Date Noted   Lump in lower inner quadrant of right breast 01/15/2022   Anemia, postpartum 04/05/2021   History of cesarean delivery 02/21/2021   History of gestational hypertension 02/20/2021   LGA (large for gestational age) fetus affecting management of mother 01/07/2021   History of gestational diabetes 12/16/2020   Genetic carrier 11/16/2020    Past Surgical History:  Procedure Laterality Date   CESAREAN SECTION N/A 02/21/2021   Procedure: CESAREAN SECTION;  Surgeon: [place] Bing, MD;  Location: [facility];  Service: Obstetrics;  Laterality: N/A;  Arrest of Dilation   MOUTH SURGERY     NO PAST SURGERIES     WISDOM TOOTH EXTRACTION      OB History     Gravida  1   Para  1   Term  1   Preterm      AB      Living  1      SAB      IAB      Ectopic      Multiple  0   Live Births  1            Home Medications    Prior to Admission medications   Medication Sig Start Date End Date Taking? Authorizing Provider  coconut oil OIL Apply 1 application topically as needed. 02/23/21   Maness, Loistine Chance, MD  ferrous  sulfate 325 (65 FE) MG tablet Take 1 tablet (325 mg total) by mouth every other day. 02/24/21   Maness, Loistine Chance, MD  ibuprofen (ADVIL) 800 MG tablet Take 1 tablet (800 mg total) by mouth every 6 (six) hours. 02/23/21   Maness, Loistine Chance, MD  oxyCODONE (OXY IR/ROXICODONE) 5 MG immediate release tablet Take 1-2 tablets (5-10 mg total) by mouth every 4 (four) hours as needed for moderate pain. 02/23/21   Maness, Loistine Chance, MD  Prenat-FeAsp-Meth-FA-DHA w/o A (PRENATE PIXIE) 10-0.6-0.4-200 MG CAPS Take 1 capsule by mouth daily. 08/31/20   Warden Fillers, MD  simethicone (MYLICON) 80 MG chewable tablet Chew 1 tablet (80 mg total) by mouth as needed for flatulence. 02/23/21   Maness, Loistine Chance, MD  albuterol (PROVENTIL HFA;VENTOLIN HFA) 108 (90 Base) MCG/ACT inhaler Inhale 2 puffs into the lungs every 4 (four) hours as needed for wheezing or shortness of breath (cough, shortness of breath or wheezing.). Patient not taking: Reported on 06/02/2018 08/23/16 10/20/19  Elvina Sidle, MD    Family History Family History  Problem Relation Age of Onset   Hypertension Mother  Hypertension Father    Asthma Sister    Diabetes Maternal Grandmother    Early death Maternal Grandmother     Social History Social History   Tobacco Use   Smoking status: Never   Smokeless tobacco: Never  Vaping Use   Vaping Use: Never used  Substance Use Topics   Alcohol use: Not Currently    Comment: occ   Drug use: No     Allergies   Patient has no known allergies.   Review of Systems Review of Systems Per HPI  Physical Exam Triage Vital Signs ED Triage Vitals [05/23/22 1747]  Enc Vitals Group     BP (!) 150/100     Pulse Rate 70     Resp 18     Temp 98 F (36.7 C)     Temp Source Oral     SpO2 98 %     Weight      Height      Head Circumference      Peak Flow      Pain Score 0     Pain Loc      Pain Edu?      Excl. in GC?    No data found.  Updated Vital Signs BP (!) 150/100 (BP Location: Left Arm)    Pulse 70   Temp 98 F (36.7 C) (Oral)   Resp 18   SpO2 98%   Breastfeeding No   Visual Acuity Right Eye Distance:   Left Eye Distance:   Bilateral Distance:    Right Eye Near:   Left Eye Near:    Bilateral Near:     Physical Exam Exam conducted with a chaperone present.  Constitutional:      General: She is not in acute distress.    Appearance: Normal appearance. She is not toxic-appearing or diaphoretic.  HENT:     Head: Normocephalic and atraumatic.  Eyes:     Extraocular Movements: Extraocular movements intact.     Conjunctiva/sclera: Conjunctivae normal.  Pulmonary:     Effort: Pulmonary effort is normal.  Chest:     Chest wall: Mass and tenderness present.       Comments: Tender mass surrounding nipple at areola that is approximately 2.5 cm in diameter present to right breast.  No erythema, discoloration, additional swelling, nipple discharge noted. Neurological:     General: No focal deficit present.     Mental Status: She is alert and oriented to person, place, and time. Mental status is at baseline.  Psychiatric:        Mood and Affect: Mood normal.        Behavior: Behavior normal.        Thought Content: Thought content normal.        Judgment: Judgment normal.      UC Treatments / Results  Labs (all labs ordered are listed, but only abnormal results are displayed) Labs Reviewed - No data to display  EKG   Radiology No results found.  Procedures Procedures (including critical care time)  Medications Ordered in UC Medications - No data to display  Initial Impression / Assessment and Plan / UC Course  I have reviewed the triage vital signs and the nursing notes.  Pertinent labs & imaging results that were available during my care of the patient were reviewed by me and considered in my medical decision making (see chart for details).     There are no signs of infection on exam.  Patient does  have masslike area surrounding nipple.  Will  send order for imaging of the breast to breast imaging center to determine etiology of patient's symptoms.  Awaiting results.  Advised patient to reach out to breast imaging center if they do not reach out to her.  Patient was given strict return and ER precautions.  Patient verbalized understanding and was agreeable with plan. Final Clinical Impressions(s) / UC Diagnoses   Final diagnoses:  Subareolar mass of right breast     Discharge Instructions      Your information has been faxed over to the breast imaging center.  Someone will reach out to you to have imaging to determine cause of breast mass.  There were no signs of infection so will defer antibiotics at this time.  Please follow-up if symptoms persist or worsen.   ED Prescriptions   None    PDMP not reviewed this encounter.   Gustavus Bryant, [place] 05/23/22 1821

## 2022-05-23 NOTE — ED Triage Notes (Signed)
Pt c/o tender mass under right nipple noticed ~ 1 week ago

## 2022-05-23 NOTE — Discharge Instructions (Signed)
Your information has been faxed over to the breast imaging center.  Someone will reach out to you to have imaging to determine cause of breast mass.  There were no signs of infection so will defer antibiotics at this time.  Please follow-up if symptoms persist or worsen.

## 2022-05-24 ENCOUNTER — Other Ambulatory Visit: Payer: Self-pay | Admitting: Internal Medicine

## 2022-05-24 DIAGNOSIS — N6310 Unspecified lump in the right breast, unspecified quadrant: Secondary | ICD-10-CM

## 2022-06-19 ENCOUNTER — Ambulatory Visit (INDEPENDENT_AMBULATORY_CARE_PROVIDER_SITE_OTHER): Payer: Medicaid Other | Admitting: Family Medicine

## 2022-06-19 ENCOUNTER — Other Ambulatory Visit (HOSPITAL_COMMUNITY)
Admission: RE | Admit: 2022-06-19 | Discharge: 2022-06-19 | Disposition: A | Discharge status: Home / Self Care | Payer: Medicaid Other | Source: Ambulatory Visit | Attending: Family Medicine | Admitting: Family Medicine

## 2022-06-19 VITALS — BP 109/72 | HR 75 | Temp 98.1°F | Resp 16 | Wt 184.0 lb

## 2022-06-19 DIAGNOSIS — R102 Pelvic and perineal pain: Secondary | ICD-10-CM | POA: Diagnosis present

## 2022-06-19 DIAGNOSIS — L732 Hidradenitis suppurativa: Secondary | ICD-10-CM

## 2022-06-19 NOTE — Progress Notes (Unsigned)
Patient is here with c/o having cramps even after her period. Patient said that feel like period cramps even though  her period is gone.

## 2022-06-21 ENCOUNTER — Encounter: Payer: Self-pay | Admitting: Family Medicine

## 2022-06-21 NOTE — Progress Notes (Signed)
Established Patient Office Visit  Subjective    Patient ID: Ebony Snyder, female    DOB: 1996-01-27  Age: 26 y.o. MRN: [id number]  CC:  Chief Complaint  Patient presents with   Dysmenorrhea    HPI Ebony Snyder presents with complaint of intermittent pelvic pain. This has been occurring for months but it is becoming more persistent. She also reports intermittent boils on her breast and in her thighs area and buttocks. She has had this occur for years.    Outpatient Encounter Medications as of 06/19/2022  Medication Sig   [DISCONTINUED] albuterol (PROVENTIL HFA;VENTOLIN HFA) 108 (90 Base) MCG/ACT inhaler Inhale 2 puffs into the lungs every 4 (four) hours as needed for wheezing or shortness of breath (cough, shortness of breath or wheezing.). (Patient not taking: Reported on 06/02/2018)   [DISCONTINUED] coconut oil OIL Apply 1 application topically as needed.   [DISCONTINUED] ferrous sulfate 325 (65 FE) MG tablet Take 1 tablet (325 mg total) by mouth every other day.   [DISCONTINUED] ibuprofen (ADVIL) 800 MG tablet Take 1 tablet (800 mg total) by mouth every 6 (six) hours.   [DISCONTINUED] oxyCODONE (OXY IR/ROXICODONE) 5 MG immediate release tablet Take 1-2 tablets (5-10 mg total) by mouth every 4 (four) hours as needed for moderate pain.   [DISCONTINUED] Prenat-FeAsp-Meth-FA-DHA w/o A (PRENATE PIXIE) 10-0.6-0.4-200 MG CAPS Take 1 capsule by mouth daily.   [DISCONTINUED] simethicone (MYLICON) 80 MG chewable tablet Chew 1 tablet (80 mg total) by mouth as needed for flatulence.   No facility-administered encounter medications on file as of 06/19/2022.    Past Medical History:  Diagnosis Date   Anxiety    Concussion    Depression    denies   Diabetes mellitus without complication (HCC)    Dysmenorrhea    Migraine without aura    STD (sexually transmitted disease)     Past Surgical History:  Procedure Laterality Date   CESAREAN SECTION N/A 02/21/2021   Procedure: CESAREAN  SECTION;  Surgeon: [place] Bing, MD;  Location: [facility];  Service: Obstetrics;  Laterality: N/A;  Arrest of Dilation   MOUTH SURGERY     NO PAST SURGERIES     WISDOM TOOTH EXTRACTION      Family History  Problem Relation Age of Onset   Hypertension Mother    Hypertension Father    Asthma Sister    Diabetes Maternal Grandmother    Early death Maternal Grandmother     Social History   Socioeconomic History   Marital status: Single    Spouse name: Not on file   Number of children: Not on file   Years of education: Not on file   Highest education level: Not on file  Occupational History   Not on file  Tobacco Use   Smoking status: Never   Smokeless tobacco: Never  Vaping Use   Vaping Use: Never used  Substance and Sexual Activity   Alcohol use: Not Currently    Comment: occ   Drug use: No   Sexual activity: Yes    Comment: condoms 50% of the time  Other Topics Concern   Not on file  Social History Narrative   Not on file   Social Determinants of Health   Financial Resource Strain: Not on file  Food Insecurity: Not on file  Transportation Needs: Not on file  Physical Activity: Not on file  Stress: Not on file  Social Connections: Not on file  Intimate Partner Violence: Not on file  Review of Systems  All other systems reviewed and are negative.       Objective    BP 109/72   Pulse 75   Temp 98.1 F (36.7 C) (Oral)   Resp 16   Wt 184 lb (83.5 kg)   SpO2 96%   BMI 33.65 kg/m   Physical Exam Vitals and nursing note reviewed.  Constitutional:      General: She is not in acute distress. Cardiovascular:     Rate and Rhythm: Normal rate and regular rhythm.  Pulmonary:     Effort: Pulmonary effort is normal.     Breath sounds: Normal breath sounds.  Abdominal:     Palpations: Abdomen is soft.     Tenderness: There is no abdominal tenderness.  Neurological:     General: No focal deficit present.     Mental Status: She is alert and  oriented to person, place, and time.         Assessment & Plan:   1. Pelvic pain Vag cultures taken. Patient to follow up with consultant for further eval/mgt - Cervicovaginal ancillary only  2. Hidradenitis Info given. Currently no active lesion. Will monitor    No follow-ups on file.   Tommie Raymond, MD

## 2022-06-26 ENCOUNTER — Other Ambulatory Visit: Payer: Self-pay | Admitting: Family Medicine

## 2022-06-26 LAB — CERVICOVAGINAL ANCILLARY ONLY
Bacterial Vaginitis (gardnerella): POSITIVE — AB
Candida Glabrata: NEGATIVE
Candida Vaginitis: POSITIVE — AB
Chlamydia: POSITIVE — AB
Comment: NEGATIVE
Comment: NEGATIVE
Comment: NEGATIVE
Comment: NEGATIVE
Comment: NEGATIVE
Comment: NORMAL
Neisseria Gonorrhea: NEGATIVE
Trichomonas: NEGATIVE

## 2022-06-26 MED ORDER — DOXYCYCLINE HYCLATE 100 MG PO TABS: 100.0000 mg | ORAL_TABLET | Freq: Two times a day (BID) | ORAL | 0 refills | Status: DC

## 2022-06-26 MED ORDER — METRONIDAZOLE 500 MG PO TABS: 500.0000 mg | ORAL_TABLET | Freq: Two times a day (BID) | ORAL | 0 refills | Status: AC

## 2022-06-26 MED ORDER — FLUCONAZOLE 150 MG PO TABS: 150.0000 mg | ORAL_TABLET | Freq: Once | ORAL | 0 refills | Status: AC

## 2022-06-28 ENCOUNTER — Other Ambulatory Visit: Payer: Medicaid Other

## 2022-07-03 ENCOUNTER — Ambulatory Visit (INDEPENDENT_AMBULATORY_CARE_PROVIDER_SITE_OTHER): Payer: Medicaid Other

## 2022-07-03 DIAGNOSIS — Z111 Encounter for screening for respiratory tuberculosis: Secondary | ICD-10-CM

## 2022-07-04 NOTE — Progress Notes (Signed)
Patient came in for TB skin test  

## 2022-07-05 ENCOUNTER — Ambulatory Visit: Payer: Medicaid Other

## 2022-07-05 LAB — TB SKIN TEST
Induration: 0 mm
TB Skin Test: NEGATIVE

## 2022-07-05 NOTE — Progress Notes (Signed)
Patient came  in for TB reading Ebony Snyder

## 2022-07-12 ENCOUNTER — Telehealth: Payer: Self-pay | Admitting: *Deleted

## 2022-07-12 NOTE — Telephone Encounter (Signed)
Patient was called to pick up paper that provider signed.

## 2022-07-16 ENCOUNTER — Other Ambulatory Visit (HOSPITAL_COMMUNITY)
Admission: RE | Admit: 2022-07-16 | Discharge: 2022-07-16 | Disposition: A | Discharge status: Home / Self Care | Payer: Medicaid Other | Source: Ambulatory Visit | Attending: Family Medicine | Admitting: Family Medicine

## 2022-07-16 ENCOUNTER — Ambulatory Visit (INDEPENDENT_AMBULATORY_CARE_PROVIDER_SITE_OTHER): Payer: Medicaid Other | Admitting: Family Medicine

## 2022-07-16 VITALS — BP 112/77 | HR 82 | Temp 97.7°F | Resp 16 | Wt 184.6 lb

## 2022-07-16 DIAGNOSIS — A64 Unspecified sexually transmitted disease: Secondary | ICD-10-CM | POA: Diagnosis present

## 2022-07-16 DIAGNOSIS — R102 Pelvic and perineal pain: Secondary | ICD-10-CM | POA: Diagnosis not present

## 2022-07-16 NOTE — Progress Notes (Unsigned)
Patient would like to be retested for STD . Patient is currently having abdominal  cramping still.

## 2022-07-17 ENCOUNTER — Ambulatory Visit
Admission: EM | Admit: 2022-07-17 | Discharge: 2022-07-17 | Disposition: A | Discharge status: Home / Self Care | Payer: Medicaid Other | Attending: Physician Assistant | Admitting: Physician Assistant

## 2022-07-17 ENCOUNTER — Encounter: Payer: Self-pay | Admitting: Family Medicine

## 2022-07-17 DIAGNOSIS — Z1152 Encounter for screening for COVID-19: Secondary | ICD-10-CM | POA: Diagnosis present

## 2022-07-17 LAB — CERVICOVAGINAL ANCILLARY ONLY
Bacterial Vaginitis (gardnerella): POSITIVE — AB
Candida Glabrata: NEGATIVE
Candida Vaginitis: NEGATIVE
Chlamydia: NEGATIVE
Comment: NEGATIVE
Comment: NEGATIVE
Comment: NEGATIVE
Comment: NEGATIVE
Comment: NEGATIVE
Comment: NORMAL
Neisseria Gonorrhea: NEGATIVE
Trichomonas: NEGATIVE

## 2022-07-17 NOTE — Progress Notes (Signed)
Established Patient Office Visit  Subjective    Patient ID: Ebony Snyder, female    DOB: [birth year]  Age: 26 y.o. MRN: [id number]  CC:  Chief Complaint  Patient presents with   Exposure to STD    HPI Ebony Snyder presents follow up after treatment for vaginitis/STI. She also reports persistent pelvic pain.    Outpatient Encounter Medications as of 07/16/2022  Medication Sig   [DISCONTINUED] albuterol (PROVENTIL HFA;VENTOLIN HFA) 108 (90 Base) MCG/ACT inhaler Inhale 2 puffs into the lungs every 4 (four) hours as needed for wheezing or shortness of breath (cough, shortness of breath or wheezing.). (Patient not taking: Reported on 06/02/2018)   [DISCONTINUED] doxycycline (VIBRA-TABS) 100 MG tablet Take 1 tablet (100 mg total) by mouth 2 (two) times daily.   No facility-administered encounter medications on file as of 07/16/2022.    Past Medical History:  Diagnosis Date   Anxiety    Concussion    Depression    denies   Diabetes mellitus without complication (HCC)    Dysmenorrhea    Migraine without aura    STD (sexually transmitted disease)     Past Surgical History:  Procedure Laterality Date   CESAREAN SECTION N/A 02/21/2021   Procedure: CESAREAN SECTION;  Surgeon: [place] Bing, MD;  Location: [facility];  Service: Obstetrics;  Laterality: N/A;  Arrest of Dilation   MOUTH SURGERY     NO PAST SURGERIES     WISDOM TOOTH EXTRACTION      Family History  Problem Relation Age of Onset   Hypertension Mother    Hypertension Father    Asthma Sister    Diabetes Maternal Grandmother    Early death Maternal Grandmother     Social History   Socioeconomic History   Marital status: Single    Spouse name: Not on file   Number of children: Not on file   Years of education: Not on file   Highest education level: Not on file  Occupational History   Not on file  Tobacco Use   Smoking status: Never   Smokeless tobacco: Never  Vaping Use   Vaping Use: Never used   Substance and Sexual Activity   Alcohol use: Not Currently    Comment: occ   Drug use: No   Sexual activity: Yes    Comment: condoms 50% of the time  Other Topics Concern   Not on file  Social History Narrative   Not on file   Social Determinants of Health   Financial Resource Strain: Not on file  Food Insecurity: Not on file  Transportation Needs: Not on file  Physical Activity: Not on file  Stress: Not on file  Social Connections: Not on file  Intimate Partner Violence: Not on file    Review of Systems  Genitourinary:  Negative for dysuria.  All other systems reviewed and are negative.       Objective    BP 112/77   Pulse 82   Temp 97.7 F (36.5 C) (Oral)   Resp 16   Wt 184 lb 9.6 oz (83.7 kg)   SpO2 97%   BMI 33.76 kg/m   Physical Exam Vitals and nursing note reviewed.  Constitutional:      General: She is not in acute distress. Cardiovascular:     Rate and Rhythm: Normal rate and regular rhythm.  Pulmonary:     Effort: Pulmonary effort is normal.     Breath sounds: Normal breath sounds.  Abdominal:  Palpations: Abdomen is soft.     Tenderness: There is no abdominal tenderness.  Neurological:     General: No focal deficit present.     Mental Status: She is alert and oriented to person, place, and time.         Assessment & Plan:   1. STI (sexually transmitted infection) ?Sx resolved.Patient re-tested for cure - Cervicovaginal ancillary only  2. Pelvic pain Referral to gyn for further eval/mgt - Ambulatory referral to Gynecology    No follow-ups on file.   Tommie Raymond, MD

## 2022-07-17 NOTE — ED Triage Notes (Signed)
[facility] presents to [facility] with co of recent Covid exposure over the weekend and started coughing today. Pt reports no other symptoms.

## 2022-07-17 NOTE — ED Provider Notes (Signed)
[facility]    CSN: [id number] Arrival date & time: 07/17/22  1636      History   Chief Complaint Chief Complaint  Patient presents with   Covid Exposure    HPI Ebony Snyder is a 26 y.o. female.   Patient here today for evaluation of cough after COVID exposure over the weekend.  She reports that she has no other symptoms.  She denies any fever.  She has not taken any medication for symptoms.  The history is provided by the patient.    Past Medical History:  Diagnosis Date   Anxiety    Concussion    Depression    denies   Diabetes mellitus without complication (HCC)    Dysmenorrhea    Migraine without aura    STD (sexually transmitted disease)     Patient Active Problem List   Diagnosis Date Noted   Lump in lower inner quadrant of right breast 01/15/2022   Anemia, postpartum 04/05/2021   History of cesarean delivery 02/21/2021   History of gestational hypertension 02/20/2021   LGA (large for gestational age) fetus affecting management of mother 01/07/2021   History of gestational diabetes 12/16/2020   Genetic carrier 11/16/2020    Past Surgical History:  Procedure Laterality Date   CESAREAN SECTION N/A 02/21/2021   Procedure: CESAREAN SECTION;  Surgeon: [place] Bing, MD;  Location: [facility];  Service: Obstetrics;  Laterality: N/A;  Arrest of Dilation   MOUTH SURGERY     NO PAST SURGERIES     WISDOM TOOTH EXTRACTION      OB History     Gravida  1   Para  1   Term  1   Preterm      AB      Living  1      SAB      IAB      Ectopic      Multiple  0   Live Births  1            Home Medications    Prior to Admission medications   Medication Sig Start Date End Date Taking? Authorizing Provider  albuterol (PROVENTIL HFA;VENTOLIN HFA) 108 (90 Base) MCG/ACT inhaler Inhale 2 puffs into the lungs every 4 (four) hours as needed for wheezing or shortness of breath (cough, shortness of breath or wheezing.). Patient not  taking: Reported on 06/02/2018 08/23/16 10/20/19  Elvina Sidle, MD    Family History Family History  Problem Relation Age of Onset   Hypertension Mother    Hypertension Father    Asthma Sister    Diabetes Maternal Grandmother    Early death Maternal Grandmother     Social History Social History   Tobacco Use   Smoking status: Never   Smokeless tobacco: Never  Vaping Use   Vaping Use: Never used  Substance Use Topics   Alcohol use: Not Currently    Comment: occ   Drug use: No     Allergies   Patient has no known allergies.   Review of Systems Review of Systems  Constitutional:  Negative for chills and fever.  HENT:  Negative for congestion, ear pain and sore throat.   Eyes:  Negative for discharge and redness.  Respiratory:  Positive for cough. Negative for shortness of breath and wheezing.   Gastrointestinal:  Negative for abdominal pain, diarrhea, nausea and vomiting.     Physical Exam Triage Vital Signs ED Triage Vitals [07/17/22 1653]  Enc Vitals  Group     BP 126/62     Pulse Rate 100     Resp 19     Temp 98.3 F (36.8 C)     Temp Source Oral     SpO2 94 %     Weight      Height      Head Circumference      Peak Flow      Pain Score      Pain Loc      Pain Edu?      Excl. in GC?    No data found.  Updated Vital Signs BP 126/62   Pulse 100   Temp 98.3 F (36.8 C) (Oral)   Resp 19   SpO2 94%   Physical Exam Vitals and nursing note reviewed.  Constitutional:      General: She is not in acute distress.    Appearance: Normal appearance. She is not ill-appearing.  HENT:     Head: Normocephalic and atraumatic.     Nose: No congestion or rhinorrhea.     Mouth/Throat:     Mouth: Mucous membranes are moist.     Pharynx: No oropharyngeal exudate or posterior oropharyngeal erythema.  Eyes:     Conjunctiva/sclera: Conjunctivae normal.  Cardiovascular:     Rate and Rhythm: Normal rate and regular rhythm.     Heart sounds: Normal heart  sounds. No murmur heard. Pulmonary:     Effort: Pulmonary effort is normal. No respiratory distress.     Breath sounds: Normal breath sounds. No wheezing, rhonchi or rales.  Skin:    General: Skin is warm and dry.  Neurological:     Mental Status: She is alert.  Psychiatric:        Mood and Affect: Mood normal.        Thought Content: Thought content normal.      UC Treatments / Results  Labs (all labs ordered are listed, but only abnormal results are displayed) Labs Reviewed  SARS CORONAVIRUS 2 (TAT 6-24 HRS)    EKG   Radiology No results found.  Procedures Procedures (including critical care time)  Medications Ordered in UC Medications - No data to display  Initial Impression / Assessment and Plan / UC Course  I have reviewed the triage vital signs and the nursing notes.  Pertinent labs & imaging results that were available during my care of the patient were reviewed by me and considered in my medical decision making (see chart for details).    We will screen for COVID.  We will await  results for further recommendation.  Final Clinical Impressions(s) / UC Diagnoses   Final diagnoses:  Encounter for screening for COVID-19   Discharge Instructions   None    ED Prescriptions   None    PDMP not reviewed this encounter.   Tomi Bamberger, PA-C 07/17/22 1732

## 2022-07-18 LAB — SARS CORONAVIRUS 2 (TAT 6-24 HRS): SARS Coronavirus 2: POSITIVE — AB

## 2022-07-21 ENCOUNTER — Other Ambulatory Visit: Payer: Self-pay | Admitting: Family Medicine

## 2022-07-21 MED ORDER — METRONIDAZOLE 500 MG PO TABS: 500.0000 mg | ORAL_TABLET | Freq: Two times a day (BID) | ORAL | 0 refills | Status: DC

## 2022-07-23 ENCOUNTER — Ambulatory Visit
Admission: RE | Admit: 2022-07-23 | Discharge: 2022-07-23 | Disposition: A | Discharge status: Home / Self Care | Payer: Medicaid Other | Source: Ambulatory Visit | Attending: Family Medicine | Admitting: Family Medicine

## 2022-07-23 ENCOUNTER — Other Ambulatory Visit: Payer: Self-pay

## 2022-07-23 VITALS — BP 127/82 | HR 65 | Temp 98.2°F | Resp 18

## 2022-07-23 DIAGNOSIS — Z1152 Encounter for screening for COVID-19: Secondary | ICD-10-CM

## 2022-07-23 NOTE — ED Triage Notes (Addendum)
Patient tested positive on Monday 07/16/2022.  Child needs a negative test so patient was getting checked too.  Patient is getting checked while she is here

## 2022-07-30 ENCOUNTER — Other Ambulatory Visit: Payer: Self-pay | Admitting: Family Medicine

## 2022-07-30 MED ORDER — METRONIDAZOLE 0.75 % VA GEL: 1.0000 | Freq: Every day | VAGINAL | 0 refills | Status: DC

## 2022-08-09 ENCOUNTER — Ambulatory Visit: Payer: Self-pay

## 2022-08-09 NOTE — Telephone Encounter (Signed)
  Chief Complaint: vaginal pain Symptoms: vaginal pain 9/10, constant Frequency: 2 months  Pertinent Negatives: Patient denies discharge, itching or pain with urination Disposition: [] ED /[] Urgent Care (no appt availability in office) / [] Appointment(In office/virtual)/ []  [place] [facility]/ [] Home Care/ [] Refused Recommended Disposition /[x] [facility]/ []  Follow-up with PCP Additional Notes: pt states she tested positive for Chlamydia back in Aug and went back for retesting and tested negative but now she is experiencing the same kind of pain. Advised no appts with practice and advised she can go to [facility] today. Provided location details and pt states she will go ahead and go.   Reason for Disposition  [1] SEVERE pain AND [2] not improved 2 hours after pain medicine  Answer Assessment - Initial Assessment Questions 1. SYMPTOM: "What's the main symptom you're concerned about?" (e.g., pain, itching, dryness)     Vaginal pain and cramping  3. ONSET: "When did the  sx  start?"     Since August  4. PAIN: "Is there any pain?" If Yes, ask: "How bad is it?" (Scale: 1-10; mild, moderate, severe)   -  MILD (1-3): Doesn't interfere with normal activities.    -  MODERATE (4-7): Interferes with normal activities (e.g., work or school) or awakens from sleep.     -  SEVERE (8-10): Excruciating pain, unable to do any normal activities.     9 5. ITCHING: "Is there any itching?" If Yes, ask: "How bad is it?" (Scale: 1-10; mild, moderate, severe)     no 6. CAUSE: "What do you think is causing the discharge?" "Have you had the same problem before? What happened then?"     Feels like same pain when had Chlamydia  Protocols used: Vaginal Symptoms-A-AH

## 2022-08-10 ENCOUNTER — Other Ambulatory Visit: Payer: Self-pay | Admitting: *Deleted

## 2022-08-10 DIAGNOSIS — R102 Pelvic and perineal pain: Secondary | ICD-10-CM

## 2022-08-10 NOTE — Telephone Encounter (Signed)
I have attempted to contact this patient by phone with the following results: no answer.   Patient has been refer to [facility] for vaginal issues

## 2022-08-15 ENCOUNTER — Encounter: Payer: Self-pay | Admitting: Obstetrics

## 2022-08-15 ENCOUNTER — Ambulatory Visit (INDEPENDENT_AMBULATORY_CARE_PROVIDER_SITE_OTHER): Payer: Medicaid Other | Admitting: Obstetrics

## 2022-08-15 ENCOUNTER — Other Ambulatory Visit (HOSPITAL_COMMUNITY)
Admission: RE | Admit: 2022-08-15 | Discharge: 2022-08-15 | Disposition: A | Discharge status: Home / Self Care | Payer: Medicaid Other | Source: Ambulatory Visit | Attending: Obstetrics | Admitting: Obstetrics

## 2022-08-15 VITALS — BP 134/84 | HR 77 | Ht 62.0 in | Wt 184.0 lb

## 2022-08-15 DIAGNOSIS — G43109 Migraine with aura, not intractable, without status migrainosus: Secondary | ICD-10-CM

## 2022-08-15 DIAGNOSIS — N760 Acute vaginitis: Secondary | ICD-10-CM

## 2022-08-15 DIAGNOSIS — R102 Pelvic and perineal pain: Secondary | ICD-10-CM

## 2022-08-15 DIAGNOSIS — E669 Obesity, unspecified: Secondary | ICD-10-CM

## 2022-08-15 DIAGNOSIS — N898 Other specified noninflammatory disorders of vagina: Secondary | ICD-10-CM | POA: Diagnosis present

## 2022-08-15 DIAGNOSIS — N946 Dysmenorrhea, unspecified: Secondary | ICD-10-CM

## 2022-08-15 DIAGNOSIS — B9689 Other specified bacterial agents as the cause of diseases classified elsewhere: Secondary | ICD-10-CM

## 2022-08-15 MED ORDER — NORETHIN ACE-ETH ESTRAD-FE 1-20 MG-MCG(24) PO TABS: 1.0000 | ORAL_TABLET | Freq: Every day | ORAL | 11 refills | Status: DC

## 2022-08-15 MED ORDER — IBUPROFEN 800 MG PO TABS: 800.0000 mg | ORAL_TABLET | Freq: Three times a day (TID) | ORAL | 5 refills | Status: DC | PRN

## 2022-08-15 MED ORDER — TINIDAZOLE 500 MG PO TABS: 1000.0000 mg | ORAL_TABLET | Freq: Every day | ORAL | 5 refills | Status: DC

## 2022-08-15 NOTE — Progress Notes (Signed)
Patient ID: Ebony Snyder, female   DOB: [birth year], 26 y.o.   MRN: [id number]  Chief Complaint  Patient presents with   GYN    HPI Ebony Snyder is a 26 y.o. female.  Complains of painful periods and severe cramping in between periods.  This has been a long-term problem.  Consideration was made to start OCP's but she refused. HPI  Past Medical History:  Diagnosis Date   Anxiety    Concussion    Depression    denies   Diabetes mellitus without complication (HCC)    Dysmenorrhea    Migraine without aura    STD (sexually transmitted disease)     Past Surgical History:  Procedure Laterality Date   CESAREAN SECTION N/A 02/21/2021   Procedure: CESAREAN SECTION;  Surgeon: [place] Bing, MD;  Location: [facility];  Service: Obstetrics;  Laterality: N/A;  Arrest of Dilation   MOUTH SURGERY     NO PAST SURGERIES     WISDOM TOOTH EXTRACTION      Family History  Problem Relation Age of Onset   Hypertension Mother    Hypertension Father    Asthma Sister    Diabetes Maternal Grandmother    Early death Maternal Grandmother     Social History Social History   Tobacco Use   Smoking status: Never   Smokeless tobacco: Never  Vaping Use   Vaping Use: Never used  Substance Use Topics   Alcohol use: Not Currently    Comment: occ   Drug use: No    No Known Allergies  Current Outpatient Medications  Medication Sig Dispense Refill   ibuprofen (ADVIL) 800 MG tablet Take 1 tablet (800 mg total) by mouth every 8 (eight) hours as needed. 30 tablet 5   Norethindrone Acetate-Ethinyl Estrad-FE (LOESTRIN 24 FE) 1-20 MG-MCG(24) tablet Take 1 tablet by mouth daily. 28 tablet 11   tinidazole (TINDAMAX) 500 MG tablet Take 2 tablets (1,000 mg total) by mouth daily with breakfast. Take for 5 days, monthly after period. 10 tablet 5   metroNIDAZOLE (METROGEL VAGINAL) 0.75 % vaginal gel Place 1 Applicatorful vaginally at bedtime. (Patient not taking: Reported on 08/15/2022) 70 g 0   No  current facility-administered medications for this visit.    Review of Systems Review of Systems Constitutional: negative for fatigue and weight loss Respiratory: negative for cough and wheezing Cardiovascular: negative for chest pain, fatigue and palpitations Gastrointestinal: negative for abdominal pain and change in bowel habits Genitourinary:positive for painful periods and severe cramping in between periods Integument/breast: negative for nipple discharge Musculoskeletal:negative for myalgias Neurological: positive for migraines with aura Behavioral/Psych: negative for abusive relationship, depression Endocrine: negative for temperature intolerance      Blood pressure 134/84, pulse 77, height 5\' 2"  (1.575 m), weight 184 lb (83.5 kg), last menstrual period 08/09/2022, not currently breastfeeding.  Physical Exam Physical Exam General:   Alert and no distress  Skin:   no rash or abnormalities  Lungs:   clear to auscultation bilaterally  Heart:   regular rate and rhythm, S1, S2 normal, no murmur, click, rub or gallop  Breasts:   normal without suspicious masses, skin or nipple changes or axillary nodes  Abdomen:  normal findings: no organomegaly, soft, non-tender and no hernia  Pelvis:  External genitalia: normal general appearance Urinary system: urethral meatus normal and bladder without fullness, nontender Vaginal: normal without tenderness, induration or masses Cervix: normal appearance Adnexa: normal bimanual exam Uterus: anteverted and non-tender, normal size  I have spent a total of 20 minutes of face-to-face and non-face-to-face time, excluding clinical staff time, reviewing notes and preparing to see patient, ordering tests and/or medications, and counseling the patient.   Data Reviewed Labs  Assessment     1. Dysmenorrhea X: - ibuprofen (ADVIL) 800 MG tablet; Take 1 tablet (800 mg total) by mouth every 8 (eight) hours as needed.  Dispense: 30 tablet; Refill:  5  2. Vaginal discharge Rx: - Cervicovaginal ancillary only  3. BV (bacterial vaginosis) Rx: - tinidazole (TINDAMAX) 500 MG tablet; Take 2 tablets (1,000 mg total) by mouth daily with breakfast. Take for 5 days, monthly after period.  Dispense: 10 tablet; Refill: 5  4. Pelvic pain Rx: - US PELVIC COMPLETE WITH TRANSVAGINAL; Future  5. Migraine with aura and without status migrainosus, not intractable - fairly stable with Ibuprofen prn - hormonal contraception is contraindicated, and this was discussed with patient  6. Obesity (BMI 30.0-34.9) - caloric reduction and exercise`recommended     Plan   Follow up in 2 weeks virtually  Orders Placed This Encounter  Procedures   US PELVIC COMPLETE WITH TRANSVAGINAL    Standing Status:   Future    Standing Expiration Date:   08/16/2023    Order Specific Question:   Reason for Exam (SYMPTOM  OR DIAGNOSIS REQUIRED)    Answer:   Pelvic pain    Order Specific Question:   Preferred imaging location?    Answer:   [facility]   Meds ordered this encounter  Medications   ibuprofen (ADVIL) 800 MG tablet    Sig: Take 1 tablet (800 mg total) by mouth every 8 (eight) hours as needed.    Dispense:  30 tablet    Refill:  5   Norethindrone Acetate-Ethinyl Estrad-FE (LOESTRIN 24 FE) 1-20 MG-MCG(24) tablet    Sig: Take 1 tablet by mouth daily.    Dispense:  28 tablet    Refill:  11   tinidazole (TINDAMAX) 500 MG tablet    Sig: Take 2 tablets (1,000 mg total) by mouth daily with breakfast. Take for 5 days, monthly after period.    Dispense:  10 tablet    Refill:  5       Shelly Bombard, MD, [place] Attending [place], [facility] for [facility], [place], [place] 08-15-22

## 2022-08-15 NOTE — Progress Notes (Signed)
Pt is in the office for GYN visit LMP 08/09/22 Last pap 02/28/22 Pt reports cramping in between menstrual cycles 6/10 without spotting. Cycles are regular and normal flow and duration

## 2022-08-16 ENCOUNTER — Encounter: Payer: Self-pay | Admitting: Obstetrics

## 2022-08-17 ENCOUNTER — Ambulatory Visit (HOSPITAL_BASED_OUTPATIENT_CLINIC_OR_DEPARTMENT_OTHER)
Admission: RE | Admit: 2022-08-17 | Discharge: 2022-08-17 | Disposition: A | Discharge status: Home / Self Care | Payer: Medicaid Other | Source: Ambulatory Visit | Attending: Obstetrics | Admitting: Obstetrics

## 2022-08-17 DIAGNOSIS — R102 Pelvic and perineal pain: Secondary | ICD-10-CM | POA: Insufficient documentation

## 2022-08-17 LAB — CERVICOVAGINAL ANCILLARY ONLY
Bacterial Vaginitis (gardnerella): POSITIVE — AB
Candida Glabrata: NEGATIVE
Candida Vaginitis: NEGATIVE
Chlamydia: NEGATIVE
Comment: NEGATIVE
Comment: NEGATIVE
Comment: NEGATIVE
Comment: NEGATIVE
Comment: NEGATIVE
Comment: NORMAL
Neisseria Gonorrhea: NEGATIVE
Trichomonas: NEGATIVE

## 2022-08-18 ENCOUNTER — Other Ambulatory Visit: Payer: Self-pay | Admitting: Obstetrics

## 2022-08-22 ENCOUNTER — Encounter: Payer: Self-pay | Admitting: Obstetrics

## 2022-08-22 ENCOUNTER — Telehealth (INDEPENDENT_AMBULATORY_CARE_PROVIDER_SITE_OTHER): Payer: Medicaid Other | Admitting: Obstetrics

## 2022-08-22 DIAGNOSIS — R102 Pelvic and perineal pain: Secondary | ICD-10-CM

## 2022-08-22 DIAGNOSIS — G43109 Migraine with aura, not intractable, without status migrainosus: Secondary | ICD-10-CM | POA: Diagnosis not present

## 2022-08-22 NOTE — Progress Notes (Signed)
GYNECOLOGY VIRTUAL VISIT ENCOUNTER NOTE  Provider location: [facility] for [place] [facility]   Patient location: Home  I connected with Ebony Snyder on 08/22/22 at  1:50 PM EDT by MyChart Video Encounter and verified that I am speaking with the correct person using two identifiers.   I discussed the limitations, risks, security and privacy concerns of performing an evaluation and management service virtually and the availability of in person appointments. I also discussed with the patient that there may be a patient responsible charge related to this service. The patient expressed understanding and agreed to proceed.   History:  Ebony Snyder is a 26 y.o. G28P1001 female being evaluated today for ultrasound results and follow up for pelvic pain.  She states that she now has no pain.  . She denies any abnormal vaginal discharge, bleeding, pelvic pain or other concerns.       Past Medical History:  Diagnosis Date   Anxiety    Concussion    Depression    denies   Diabetes mellitus without complication ([place])    Dysmenorrhea    Migraine without aura    STD (sexually transmitted disease)    Past Surgical History:  Procedure Laterality Date   CESAREAN SECTION N/A 02/21/2021   Procedure: CESAREAN SECTION;  Surgeon: Aletha Halim, MD;  Location: [facility]S;  Service: Obstetrics;  Laterality: N/A;  Arrest of Dilation   MOUTH SURGERY     NO PAST SURGERIES     WISDOM TOOTH EXTRACTION     The following portions of the patient's history were reviewed and updated as appropriate: allergies, current medications, past family history, past medical history, past social history, past surgical history and problem list.   Health Maintenance:  ASCUS pap and negative HRHPV on 02-28-22.    Review of Systems:  Pertinent items noted in HPI and remainder of comprehensive ROS otherwise negative.  Physical Exam:   General:  Alert, oriented and cooperative. Patient appears to be in no  acute distress.  Mental Status: Normal mood and affect. Normal behavior. Normal judgment and thought content.   Respiratory: Normal respiratory effort, no problems with respiration noted  Rest of physical exam deferred due to type of encounter  Labs and Imaging Results for orders placed or performed in visit on 08/15/22 (from the past 336 hour(s))  Cervicovaginal ancillary only   Collection Time: 08/15/22  4:35 PM  Result Value Ref Range   Neisseria Gonorrhea Negative    Chlamydia Negative    Trichomonas Negative    Bacterial Vaginitis (gardnerella) Positive (A)    Candida Vaginitis Negative    Candida Glabrata Negative    Comment      Normal Reference Range Bacterial Vaginosis - Negative   Comment Normal Reference Range Candida Species - Negative    Comment Normal Reference Range Candida Galbrata - Negative    Comment Normal Reference Range Trichomonas - Negative    Comment Normal Reference Ranger Chlamydia - Negative    Comment      Normal Reference Range Neisseria Gonorrhea - Negative   US PELVIC COMPLETE WITH TRANSVAGINAL  Result Date: 08/17/2022 CLINICAL DATA:  Pelvic pain, LMP 08/09/2022 EXAM: TRANSABDOMINAL AND TRANSVAGINAL ULTRASOUND OF PELVIS TECHNIQUE: Both transabdominal and transvaginal ultrasound examinations of the pelvis were performed. Transabdominal technique was performed for global imaging of the pelvis including uterus, ovaries, adnexal regions, and pelvic cul-de-sac. It was necessary to proceed with endovaginal exam following the transabdominal exam to visualize the endometrium. COMPARISON:  06/30/2020 FINDINGS: Uterus Measurements: 9.5 x 3.8 x 4.8 cm = volume: 91 mL. Anteverted. Heterogeneous myometrium. No focal myometrial mass. Nabothian cysts in cervix. Endometrium Thickness: 7 mm.  No endometrial fluid or mass Right ovary Measurements: 3.4 x 2.1 x 2.2 cm = volume: 8.2 mL. Normal morphology without mass Left ovary Measurements: 3.2 x 2.0 x 1.6 cm = volume: 5.4 mL.  Normal morphology without mass Other findings No free pelvic fluid or adnexal masses. IMPRESSION: Normal exam. Electronically Signed   By: Ulyses Southward M.D.   On: 08/17/2022 14:20       Assessment and Plan:     1. Pelvic pain - clinically stable  2. Migraine with aura and without status migrainosus, not intractable -  clinically stable with Ibuprofen - recommended avoiding hormonal contraceptives because of risk opf stroke and clots - she uses condoms for contraception.  Non-hormonal IUD is good alternative      I discussed the assessment and treatment plan with the patient. The patient was provided an opportunity to ask questions and all were answered. The patient agreed with the plan and demonstrated an understanding of the instructions.   The patient was advised to call back or seek an in-person evaluation/go to the ED if the symptoms worsen or if the condition fails to improve as anticipated.  I have spent a total of 10 minutes of face-to-face and non-face-to-face time, excluding clinical staff time, reviewing notes and preparing to see patient, ordering tests and/or medications, and counseling the patient.    Coral Ceo, MD [facility], [facility], [place] 08-22-22

## 2022-11-13 ENCOUNTER — Ambulatory Visit (INDEPENDENT_AMBULATORY_CARE_PROVIDER_SITE_OTHER): Payer: Medicaid Other | Admitting: Family Medicine

## 2022-11-13 VITALS — BP 120/80 | HR 69 | Temp 98.1°F | Resp 16 | Wt 186.6 lb

## 2022-11-13 DIAGNOSIS — Z202 Contact with and (suspected) exposure to infections with a predominantly sexual mode of transmission: Secondary | ICD-10-CM | POA: Diagnosis not present

## 2022-11-13 DIAGNOSIS — Z9889 Other specified postprocedural states: Secondary | ICD-10-CM

## 2022-11-13 DIAGNOSIS — Z3202 Encounter for pregnancy test, result negative: Secondary | ICD-10-CM | POA: Diagnosis not present

## 2022-11-13 DIAGNOSIS — Z3009 Encounter for other general counseling and advice on contraception: Secondary | ICD-10-CM

## 2022-11-13 DIAGNOSIS — Z30019 Encounter for initial prescription of contraceptives, unspecified: Secondary | ICD-10-CM

## 2022-11-13 LAB — POCT URINE PREGNANCY: Preg Test, Ur: NEGATIVE

## 2022-11-13 NOTE — Progress Notes (Unsigned)
Patient is here to talk about birth control. Patient has other concerns about bacterial vaginosis

## 2022-11-14 ENCOUNTER — Encounter: Payer: Self-pay | Admitting: Family Medicine

## 2022-11-14 NOTE — Progress Notes (Signed)
Established Patient Office Visit  Subjective    Patient ID: Ebony Snyder, female    DOB: August 27, 1996  Age: 27 y.o. MRN: [id number]  CC: No chief complaint on file.   HPI Ebony Snyder presents with complaint of having a recent elective abortion about 3 weeks ago. She has not had follow up. She denies acute complaints. She reports that she does desire birth control.    Outpatient Encounter Medications as of 11/13/2022  Medication Sig   ibuprofen (ADVIL) 800 MG tablet Take 1 tablet (800 mg total) by mouth every 8 (eight) hours as needed.   tinidazole (TINDAMAX) 500 MG tablet Take 2 tablets (1,000 mg total) by mouth daily with breakfast. Take for 5 days, monthly after period.   metroNIDAZOLE (METROGEL VAGINAL) 0.75 % vaginal gel Place 1 Applicatorful vaginally at bedtime. (Patient not taking: Reported on 11/13/2022)   [DISCONTINUED] albuterol (PROVENTIL HFA;VENTOLIN HFA) 108 (90 Base) MCG/ACT inhaler Inhale 2 puffs into the lungs every 4 (four) hours as needed for wheezing or shortness of breath (cough, shortness of breath or wheezing.). (Patient not taking: Reported on 06/02/2018)   No facility-administered encounter medications on file as of 11/13/2022.    Past Medical History:  Diagnosis Date   Anxiety    Concussion    Depression    denies   Diabetes mellitus without complication ([place])    Dysmenorrhea    Migraine without aura    STD (sexually transmitted disease)     Past Surgical History:  Procedure Laterality Date   CESAREAN SECTION N/A 02/21/2021   Procedure: CESAREAN SECTION;  Surgeon: Aletha Halim, MD;  Location: [facility];  Service: Obstetrics;  Laterality: N/A;  Arrest of Dilation   MOUTH SURGERY     NO PAST SURGERIES     WISDOM TOOTH EXTRACTION      Family History  Problem Relation Age of Onset   Hypertension Mother    Hypertension Father    Asthma Sister    Diabetes Maternal Grandmother    Early death Maternal Grandmother     Social History    Socioeconomic History   Marital status: Single    Spouse name: Not on file   Number of children: Not on file   Years of education: Not on file   Highest education level: Not on file  Occupational History   Not on file  Tobacco Use   Smoking status: Never   Smokeless tobacco: Never  Vaping Use   Vaping Use: Never used  Substance and Sexual Activity   Alcohol use: Not Currently    Comment: occ   Drug use: No   Sexual activity: Yes    Comment: condoms 50% of the time  Other Topics Concern   Not on file  Social History Narrative   Not on file   Social Determinants of Health   Financial Resource Strain: Not on file  Food Insecurity: Not on file  Transportation Needs: Not on file  Physical Activity: Not on file  Stress: Not on file  Social Connections: Not on file  Intimate Partner Violence: Not on file    Review of Systems  All other systems reviewed and are negative.       Objective    BP 120/80   Pulse 69   Temp 98.1 F (36.7 C) (Oral)   Resp 16   Wt 186 lb 9.6 oz (84.6 kg)   BMI 34.13 kg/m   Physical Exam Vitals and nursing note reviewed.  Constitutional:  General: She is not in acute distress. Cardiovascular:     Rate and Rhythm: Normal rate and regular rhythm.  Pulmonary:     Effort: Pulmonary effort is normal.     Breath sounds: Normal breath sounds.  Abdominal:     Palpations: Abdomen is soft.     Tenderness: There is no abdominal tenderness.  Neurological:     General: No focal deficit present.     Mental Status: She is alert and oriented to person, place, and time.  Psychiatric:        Mood and Affect: Affect normal. Mood is anxious.         Assessment & Plan:   1. History of elective abortion Patient encouraged to make/keep follow up for re-eval s/p abortion.   2. Encounter for other general counseling or advice on contraception Patient told to inquire at post-procedural eval or can return for starting ocp if necessary.  Patient v.u. - POCT urine pregnancy  3. Possible exposure to STD This is most likely to be performed at post-procedure eval.    Return if symptoms worsen or fail to improve.   Becky Sax, MD

## 2022-11-22 ENCOUNTER — Other Ambulatory Visit (HOSPITAL_COMMUNITY)
Admission: RE | Admit: 2022-11-22 | Discharge: 2022-11-22 | Disposition: A | Discharge status: Home / Self Care | Payer: Medicaid Other | Source: Ambulatory Visit | Attending: Obstetrics | Admitting: Obstetrics

## 2022-11-22 ENCOUNTER — Encounter: Payer: Self-pay | Admitting: Obstetrics

## 2022-11-22 ENCOUNTER — Ambulatory Visit (INDEPENDENT_AMBULATORY_CARE_PROVIDER_SITE_OTHER): Payer: Medicaid Other | Admitting: Obstetrics

## 2022-11-22 VITALS — BP 129/75 | HR 83 | Ht 62.0 in | Wt 188.6 lb

## 2022-11-22 DIAGNOSIS — N898 Other specified noninflammatory disorders of vagina: Secondary | ICD-10-CM | POA: Insufficient documentation

## 2022-11-22 MED ORDER — METRONIDAZOLE 500 MG PO TABS: 500.0000 mg | ORAL_TABLET | Freq: Two times a day (BID) | ORAL | 2 refills | Status: DC

## 2022-11-22 NOTE — Progress Notes (Signed)
Patient ID: Ebony Snyder, female   DOB: November 30, 1995, 27 y.o.   MRN: [id number]  No chief complaint on file.   HPI Ebony Snyder is a 27 y.o. female.  Complains of pelvic pain.  Denies dysuria, constipation or diarrhea. HPI  Past Medical History:  Diagnosis Date   Anxiety    Concussion    Depression    denies   Diabetes mellitus without complication ([place])    Dysmenorrhea    Migraine without aura    STD (sexually transmitted disease)     Past Surgical History:  Procedure Laterality Date   CESAREAN SECTION N/A 02/21/2021   Procedure: CESAREAN SECTION;  Surgeon: Aletha Halim, MD;  Location: [facility];  Service: Obstetrics;  Laterality: N/A;  Arrest of Dilation   MOUTH SURGERY     NO PAST SURGERIES     WISDOM TOOTH EXTRACTION      Family History  Problem Relation Age of Onset   Hypertension Mother    Hypertension Father    Asthma Sister    Diabetes Maternal Grandmother    Early death Maternal Grandmother     Social History Social History   Tobacco Use   Smoking status: Never   Smokeless tobacco: Never  Vaping Use   Vaping Use: Never used  Substance Use Topics   Alcohol use: Not Currently    Comment: occ   Drug use: No    No Known Allergies  Current Outpatient Medications  Medication Sig Dispense Refill   metroNIDAZOLE (FLAGYL) 500 MG tablet Take 1 tablet (500 mg total) by mouth 2 (two) times daily. 14 tablet 2   ibuprofen (ADVIL) 800 MG tablet Take 1 tablet (800 mg total) by mouth every 8 (eight) hours as needed. (Patient not taking: Reported on 11/22/2022) 30 tablet 5   No current facility-administered medications for this visit.    Review of Systems Review of Systems Constitutional: negative for fatigue and weight loss Respiratory: negative for cough and wheezing Cardiovascular: negative for chest pain, fatigue and palpitations Gastrointestinal: negative for abdominal pain and change in bowel habits Genitourinary:positive for pelvic  pain Integument/breast: negative for nipple discharge Musculoskeletal:negative for myalgias Neurological: negative for gait problems and tremors Behavioral/Psych: negative for abusive relationship, depression Endocrine: negative for temperature intolerance      Blood pressure 129/75, pulse 83, height 5\' 2"  (1.575 m), weight 188 lb 9.6 oz (85.5 kg), last menstrual period 11/04/2022, not currently breastfeeding.  Physical Exam Physical Exam General:   Alert and no discuss  Skin:   no rash or abnormalities  Lungs:   clear to auscultation bilaterally  Heart:   regular rate and rhythm, S1, S2 normal, no murmur, click, rub or gallop  Breasts:   Not examined  Abdomen:  normal findings: no organomegaly, soft, non-tender and no hernia  Pelvis:  External genitalia: normal general appearance Urinary system: urethral meatus normal and bladder without fullness, nontender Vaginal: normal without tenderness, induration or masses Cervix: normal appearance Adnexa: normal bimanual exam Uterus: anteverted and non-tender, normal size    I have spent a total of 20 minutes of face-to-face time, excluding clinical staff time, reviewing notes and preparing to see patient, ordering tests and/or medications, and counseling the patient.   Data Reviewed WEet prep  Assessment     1. Vaginal discharge Rx: - Cervicovaginal ancillary only( [place]) - metroNIDAZOLE (FLAGYL) 500 MG tablet; Take 1 tablet (500 mg total) by mouth 2 (two) times daily.  Dispense: 14 tablet; Refill: 2  Plan   Normal exan.  Recent ultrasound normal.  Will treat for BV and follow clinically.   Follow up in 6 weeks`..   Meds ordered this encounter  Medications   metroNIDAZOLE (FLAGYL) 500 MG tablet    Sig: Take 1 tablet (500 mg total) by mouth 2 (two) times daily.    Dispense:  14 tablet    Refill:  2    Linville Decarolis A. Jodi Mourning MD 11/22/2022

## 2022-11-22 NOTE — Progress Notes (Signed)
GYN presents for abdominal pain. Pt reports cramps daily. Pt tested positive for BV in October and could not afford treatment. Requesting STD testing.

## 2022-11-23 LAB — CERVICOVAGINAL ANCILLARY ONLY
Bacterial Vaginitis (gardnerella): POSITIVE — AB
Candida Glabrata: NEGATIVE
Candida Vaginitis: NEGATIVE
Chlamydia: NEGATIVE
Comment: NEGATIVE
Comment: NEGATIVE
Comment: NEGATIVE
Comment: NEGATIVE
Comment: NEGATIVE
Comment: NORMAL
Neisseria Gonorrhea: NEGATIVE
Trichomonas: NEGATIVE

## 2022-11-24 ENCOUNTER — Other Ambulatory Visit: Payer: Self-pay | Admitting: Obstetrics

## 2023-01-03 ENCOUNTER — Telehealth: Payer: Medicaid Other | Admitting: Obstetrics

## 2023-01-03 ENCOUNTER — Encounter: Payer: Self-pay | Admitting: Obstetrics

## 2023-01-03 ENCOUNTER — Telehealth (INDEPENDENT_AMBULATORY_CARE_PROVIDER_SITE_OTHER): Payer: Medicaid Other | Admitting: Obstetrics

## 2023-01-03 DIAGNOSIS — E669 Obesity, unspecified: Secondary | ICD-10-CM | POA: Diagnosis not present

## 2023-01-03 DIAGNOSIS — N898 Other specified noninflammatory disorders of vagina: Secondary | ICD-10-CM

## 2023-01-03 DIAGNOSIS — B9689 Other specified bacterial agents as the cause of diseases classified elsewhere: Secondary | ICD-10-CM

## 2023-01-03 DIAGNOSIS — N760 Acute vaginitis: Secondary | ICD-10-CM

## 2023-01-03 DIAGNOSIS — N946 Dysmenorrhea, unspecified: Secondary | ICD-10-CM | POA: Diagnosis not present

## 2023-01-03 MED ORDER — METRONIDAZOLE 0.75 % VA GEL: 1.0000 | Freq: Two times a day (BID) | VAGINAL | 2 refills | Status: DC

## 2023-01-03 NOTE — Progress Notes (Signed)
   TELEHEALTH GYNECOLOGY VISIT ENCOUNTER NOTE  Provider location: [facility]   Patient location: Home  I connected with Ebony Snyder on 01/03/23 at  3:10 PM EST by telephone and verified that I am speaking with the correct person using two identifiers. Patient was unable to do MyChart audiovisual encounter due to technical difficulties, she tried several times.    I discussed the limitations, risks, security and privacy concerns of performing an evaluation and management service by telephone and the availability of in person appointments. I also discussed with the patient that there may be a patient responsible charge related to this service. The patient expressed understanding and agreed to proceed.   History:  Ebony Snyder is a 27 y.o. [id number] female being evaluated today for follow up after treatment for BV. She states that she did not tolerate the Flagyl, and is requesting MetroGel Vaginal.      Past Medical History:  Diagnosis Date   Anxiety    Concussion    Depression    denies   Diabetes mellitus without complication ([place])    Dysmenorrhea    Migraine without aura    STD (sexually transmitted disease)    Past Surgical History:  Procedure Laterality Date   CESAREAN SECTION N/A 02/21/2021   Procedure: CESAREAN SECTION;  Surgeon: Aletha Halim, MD;  Location: [facility];  Service: Obstetrics;  Laterality: N/A;  Arrest of Dilation   MOUTH SURGERY     NO PAST SURGERIES     WISDOM TOOTH EXTRACTION     The following portions of the patient's history were reviewed and updated as appropriate: allergies, current medications, past family history, past medical history, past social history, past surgical history and problem list.   Health Maintenance:  Normal pap and negative HRHPV on 02-28-2022.    Review of Systems:  Pertinent items noted in HPI and remainder of comprehensive ROS otherwise negative.  Physical Exam:   General:  Alert, oriented  and cooperative.   Mental Status: Normal mood and affect perceived. Normal judgment and thought content.  Physical exam deferred due to nature of the encounter  Labs and Imaging No results found for this or any previous visit (from the past 336 hour(s)). No results found.    Assessment and Plan:     1. Vaginal discharge  2. BV (bacterial vaginosis) Rx: - metroNIDAZOLE (METROGEL) 0.75 % vaginal gel; Place 1 Applicatorful vaginally 2 (two) times daily. For 5 days.  Dispense: 70 g; Refill: 2  3. Dysmenorrhea - Ibuprofen prn as directed  4. Obesity (BMI 30.0-34.9) - weight reduction recommended        I discussed the assessment and treatment plan with the patient. The patient was provided an opportunity to ask questions and all were answered. The patient agreed with the plan and demonstrated an understanding of the instructions.   The patient was advised to call back or seek an in-person evaluation/go to the ED if the symptoms worsen or if the condition fails to improve as anticipated.  I have spent a total of 10 minutes of non-face-to-face time, excluding clinical staff time, reviewing notes and preparing to see patient, ordering tests and/or medications, and counseling the patient.    Baltazar Najjar, MD Center for [facility], [place], [facility] 01/03/23

## 2023-01-03 NOTE — Progress Notes (Signed)
TC to patient for f/u. Still having pelvic main and abdominal cramps.  Unable to tolerate oral metronidazole.

## 2023-01-13 ENCOUNTER — Emergency Department (HOSPITAL_COMMUNITY): Payer: Medicaid Other

## 2023-01-13 ENCOUNTER — Emergency Department (HOSPITAL_COMMUNITY)
Admission: EM | Admit: 2023-01-13 | Discharge: 2023-01-13 | Disposition: A | Discharge status: Home / Self Care | Payer: Medicaid Other | Attending: Emergency Medicine | Admitting: Emergency Medicine

## 2023-01-13 ENCOUNTER — Other Ambulatory Visit: Payer: Self-pay

## 2023-01-13 DIAGNOSIS — N6001 Solitary cyst of right breast: Secondary | ICD-10-CM | POA: Insufficient documentation

## 2023-01-13 LAB — CBC WITH DIFFERENTIAL/PLATELET
Abs Immature Granulocytes: 0.01 10*3/uL (ref 0.00–0.07)
Basophils Absolute: 0 10*3/uL (ref 0.0–0.1)
Basophils Relative: 0 %
Eosinophils Absolute: 0.3 10*3/uL (ref 0.0–0.5)
Eosinophils Relative: 3 %
HCT: 38 % (ref 36.0–46.0)
Hemoglobin: 12.2 g/dL (ref 12.0–15.0)
Immature Granulocytes: 0 %
Lymphocytes Relative: 27 %
Lymphs Abs: 2.1 10*3/uL (ref 0.7–4.0)
MCH: 29.1 pg (ref 26.0–34.0)
MCHC: 32.1 g/dL (ref 30.0–36.0)
MCV: 90.7 fL (ref 80.0–100.0)
Monocytes Absolute: 0.9 10*3/uL (ref 0.1–1.0)
Monocytes Relative: 12 %
Neutro Abs: 4.5 10*3/uL (ref 1.7–7.7)
Neutrophils Relative %: 58 %
Platelets: 239 10*3/uL (ref 150–400)
RBC: 4.19 MIL/uL (ref 3.87–5.11)
RDW: 13.3 % (ref 11.5–15.5)
WBC: 7.8 10*3/uL (ref 4.0–10.5)
nRBC: 0 % (ref 0.0–0.2)

## 2023-01-13 LAB — COMPREHENSIVE METABOLIC PANEL
ALT: 20 U/L (ref 0–44)
AST: 21 U/L (ref 15–41)
Albumin: 3.3 g/dL — ABNORMAL LOW (ref 3.5–5.0)
Alkaline Phosphatase: 47 U/L (ref 38–126)
Anion gap: 11 (ref 5–15)
BUN: 11 mg/dL (ref 6–20)
CO2: 21 mmol/L — ABNORMAL LOW (ref 22–32)
Calcium: 8.9 mg/dL (ref 8.9–10.3)
Chloride: 106 mmol/L (ref 98–111)
Creatinine, Ser: 0.72 mg/dL (ref 0.44–1.00)
GFR, Estimated: >60 mL/min (ref >60)
Glucose, Bld: 122 mg/dL — ABNORMAL HIGH (ref 70–99)
Potassium: 3.7 mmol/L (ref 3.5–5.1)
Sodium: 138 mmol/L (ref 135–145)
Total Bilirubin: 0.5 mg/dL (ref 0.3–1.2)
Total Protein: 6.4 g/dL — ABNORMAL LOW (ref 6.5–8.1)

## 2023-01-13 LAB — LACTIC ACID, PLASMA: Lactic Acid, Venous: 1.3 mmol/L (ref 0.5–1.9)

## 2023-01-13 LAB — HCG, SERUM, QUALITATIVE: Preg, Serum: NEGATIVE

## 2023-01-13 MED ORDER — SULFAMETHOXAZOLE-TRIMETHOPRIM 800-160 MG PO TABS
1.0000 | ORAL_TABLET | Freq: Once | ORAL | Status: AC
  Administered 2023-01-13: 1 via ORAL
  Filled 2023-01-13: qty 1

## 2023-01-13 MED ORDER — SULFAMETHOXAZOLE-TRIMETHOPRIM 800-160 MG PO TABS: 1.0000 | ORAL_TABLET | Freq: Two times a day (BID) | ORAL | 0 refills | Status: AC

## 2023-01-13 MED ORDER — OXYCODONE HCL 5 MG PO TABS
5.0000 mg | ORAL_TABLET | Freq: Once | ORAL | Status: AC
  Administered 2023-01-13: 5 mg via ORAL
  Filled 2023-01-13: qty 1

## 2023-01-13 MED ORDER — ACETAMINOPHEN 500 MG PO TABS
1000.0000 mg | ORAL_TABLET | Freq: Once | ORAL | Status: AC
  Administered 2023-01-13: 1000 mg via ORAL
  Filled 2023-01-13: qty 2

## 2023-01-13 MED ORDER — OXYCODONE HCL 5 MG PO TABS: 5.0000 mg | ORAL_TABLET | Freq: Four times a day (QID) | ORAL | 0 refills | Status: AC | PRN

## 2023-01-13 MED ORDER — LIDOCAINE 4 % EX CREA
TOPICAL_CREAM | Freq: Once | CUTANEOUS | Status: AC
  Administered 2023-01-13: 1 via TOPICAL
  Filled 2023-01-13 (×2): qty 5

## 2023-01-13 NOTE — ED Triage Notes (Signed)
Patient here for evaluation of a painful mass on her breast. Patient states she was diagnosed with cystic breast several months ago and referred to a breast center. Patient states the mass she is having evaluated today appeared four days ago.

## 2023-01-13 NOTE — ED Provider Notes (Cosign Needed Addendum)
[facility] Provider Note   CSN: [id number] Arrival date & time: 01/13/23  [id number]  History  Chief Complaint  Patient presents with   Cyst   Ebony Snyder is a 27 y.o. female.  27 year old woman here today with 5 days of worsening pain from right breast cyst.  She reports history of various boils in the axilla and groin requiring drainage, does not carry the diagnosis of hidradenitis suppurativa at this time.  She also reports history of breast cysts, says she was referred by her PCP to the breast center late last year where she was evaluated and told she has cystic breasts.  Patient cannot recall if she had ultrasound or mammogram (unfortunately I am unable to see these results in our charting system).  Denies fever, chills, nausea, vomiting, diarrhea.  No drainage of the cystic area, although patient has seen streaking coming from the area. No nipple drainage or expression of blood. No night sweats or unintentional weight loss. No preceding illnesses.  No trauma to the area that she can recall.     Home Medications Prior to Admission medications   Medication Sig Start Date End Date Taking? Authorizing Provider  sulfamethoxazole-trimethoprim (BACTRIM DS) 800-160 MG tablet Take 1 tablet by mouth 2 (two) times daily for 7 days. Start evening of Sunday 3/10 01/13/23 01/20/23 Yes Ezequiel Essex, MD  ibuprofen (ADVIL) 800 MG tablet Take 1 tablet (800 mg total) by mouth every 8 (eight) hours as needed. 08/15/22   Shelly Bombard, MD  metroNIDAZOLE (METROGEL) 0.75 % vaginal gel Place 1 Applicatorful vaginally 2 (two) times daily. For 5 days. 01/03/23   Shelly Bombard, MD  albuterol (PROVENTIL HFA;VENTOLIN HFA) 108 (90 Base) MCG/ACT inhaler Inhale 2 puffs into the lungs every 4 (four) hours as needed for wheezing or shortness of breath (cough, shortness of breath or wheezing.). Patient not taking: Reported on 06/02/2018 08/23/16 10/20/19  Robyn Haber, MD     Allergies    Patient has no known allergies.    Review of Systems   Review of Systems  Constitutional:  Negative for activity change, appetite change, chills, diaphoresis, fatigue and fever.  Respiratory:  Negative for shortness of breath and wheezing.   Cardiovascular:  Negative for chest pain.  Skin:  Positive for color change and wound.   Physical Exam Updated Vital Signs BP (!) 110/58   Pulse 73   Temp 98.9 F (37.2 C) (Oral)   Resp 18   Ht '5\' 2"'$  (1.575 m)   Wt 85.5 kg   SpO2 100%   BMI 34.48 kg/m  Physical Exam Vitals and nursing note reviewed. Exam conducted with a chaperone present.  Constitutional:      General: She is not in acute distress.    Appearance: Normal appearance. She is obese. She is not ill-appearing, toxic-appearing or diaphoretic.  HENT:     Head: Normocephalic and atraumatic.  Cardiovascular:     Rate and Rhythm: Normal rate and regular rhythm.  Pulmonary:     Effort: Pulmonary effort is normal.     Breath sounds: Normal breath sounds.  Chest:  Breasts:    Right: Swelling, mass, skin change and tenderness present. No bleeding, inverted nipple or nipple discharge.       Comments: Swelling, erythema, and tenderness of medial right breast at 5 o'clock position Abdominal:     General: Abdomen is flat. Bowel sounds are normal.     Palpations: Abdomen is soft.  Lymphadenopathy:     Upper Body:     Right upper body: No supraclavicular, axillary or pectoral adenopathy.  Skin:    General: Skin is warm.     Capillary Refill: Capillary refill takes less than 2 seconds.  Neurological:     General: No focal deficit present.     Mental Status: She is alert and oriented to person, place, and time.  Psychiatric:        Mood and Affect: Mood normal.        Behavior: Behavior normal.    ED Results / Procedures / Treatments   Labs (all labs ordered are listed, but only abnormal results are displayed) Labs Reviewed  COMPREHENSIVE  METABOLIC PANEL - Abnormal; Notable for the following components:      Result Value   CO2 21 (*)    Glucose, Bld 122 (*)    Total Protein 6.4 (*)    Albumin 3.3 (*)    All other components within normal limits  HCG, SERUM, QUALITATIVE  CBC WITH DIFFERENTIAL/PLATELET  LACTIC ACID, PLASMA  LACTIC ACID, PLASMA   EKG None  Radiology [place] LIMITED ULTRASOUND INCLUDING AXILLA RIGHT BREAST  Result Date: 01/13/2023 CLINICAL DATA:  Tender red palpable mass in the RIGHT breast. Concern for abscess. EXAM: ULTRASOUND OF THE RIGHT 01/29/2022 BREAST COMPARISON:  None available. FINDINGS: Targeted ultrasound is performed, showing a complex hypoechoic, anechoic, and hyperechoic mass within the 5 o'clock location of the RIGHT breast 12 centimeters from nipple, at the inframammary fold region. Mass measures 2.6 x 2.0 x 2.7 centimeters. The solid components are vascular on Doppler evaluation. The overlying skin is minimally thickened. IMPRESSION: Complex heterogeneous mass in the 5 o'clock location of the RIGHT breast measuring 2.7 centimeters. This correlates with the area of erythema and pain. The appearance favors an intradermal inclusion cyst with rupture or infection. A papillary lesion (benign or malignant) could have a similar appearance. RECOMMENDATION: Recommend treatment for infection as appropriate and follow-up ultrasound with possible mammogram in a breast imaging facility. I have discussed the findings and recommendations with the patient. If applicable, a reminder letter will be sent to the patient regarding the next appointment. BI-RADS CATEGORY  3: Probably benign. Electronically Signed   By: Nolon Nations M.D.   On: 01/13/2023 11:22    Procedures Procedures   Medications Ordered in ED Medications  sulfamethoxazole-trimethoprim (BACTRIM DS) 800-160 MG per tablet 1 tablet (has no administration in time range)  acetaminophen (TYLENOL) tablet 1,000 mg (1,000 mg Oral Given 01/13/23 0901)   lidocaine (LMX) 4 % cream (1 Application Topical Given 01/13/23 0912)  oxyCODONE (Oxy IR/ROXICODONE) immediate release tablet 5 mg (5 mg Oral Given 01/13/23 0946)   ED Course/ Medical Decision Making/ A&P  Medical Decision Making 27 yo woman here with what appears to be right breast cyst vs abscess. Will provide pain relief with tylenol 1,000 mg, lidocaine cream, and oxycodone IR 5 mg. Plan for [place] to characterize fluid collection. Labs to r/o systemic infection and current pregnancy: CBC, CMP, lactic acid, qual HCG.   10:45 AM: Labs returned unremarkable.  Soft tissue ultrasound concerning for abscess, however awaiting final read prior to contacting general surgery.  11:40 AM: Ultrasound results back, most consistent with complicated cyst.  BI-RADS 3, likely benign.  Spoke with general surgery, they recommend antibiotic treatment and follow-up with breast or outpatient tomorrow.  Will start Bactrim here in the ED and send rest of course to home pharmacy.  Will provide patient with breast center  and central [place] surgery phone numbers.  Amount and/or Complexity of Data Reviewed Labs: ordered. Decision-making details documented in ED Course.    Details: CBC, CMP, lactic acid, qual HCG.  Radiology: ordered. Decision-making details documented in ED Course.    Details: Right breast soft tissue US  Risk OTC drugs. Prescription drug management.   Final Clinical Impression(s) / ED Diagnoses Final diagnoses:  Breast cyst, right   Rx / DC Orders ED Discharge Orders          Ordered    sulfamethoxazole-trimethoprim (BACTRIM DS) 800-160 MG tablet  2 times daily        01/13/23 1140           Ezequiel Essex, MD   Ezequiel Essex, MD 01/13/23 1142    Ezequiel Essex, MD 01/13/23 1236    Tegeler, Gwenyth Allegra, MD 01/15/23 1515

## 2023-01-13 NOTE — ED Notes (Signed)
Pt medicated per mar

## 2023-01-13 NOTE — Discharge Instructions (Addendum)
Your ultrasound showed a likely cyst in your breast. We have spoken with general surgery and they recommend starting antibiotics and having you see the breast center tomorrow. Please call them first thing in the morning.   Breast Center:  [phone] (nurse line) and office number is [phone].   Call [facility] for help with the breast center appointment if you are unable to schedule soon with the breast center. We spoke with PA Alferd Apa about your case.   We also started you on antibiotics. We gave you one dose of Bactrim here in the emergency room. Pick up the rest of your antibiotic and start it tonight. Take twice daily for 7 days total.

## 2023-01-13 NOTE — ED Notes (Signed)
Pt medicated per mar Pt reports increased pain now reports sharp stabbing pain to right lower medical aspect of breast

## 2023-01-13 NOTE — ED Notes (Signed)
Ultrasound at bedside

## 2023-01-24 ENCOUNTER — Other Ambulatory Visit: Payer: Self-pay | Admitting: Internal Medicine

## 2023-01-24 ENCOUNTER — Ambulatory Visit
Admission: RE | Admit: 2023-01-24 | Discharge: 2023-01-24 | Disposition: A | Discharge status: Home / Self Care | Payer: Medicaid Other | Source: Ambulatory Visit | Attending: Internal Medicine | Admitting: Internal Medicine

## 2023-01-24 DIAGNOSIS — N6310 Unspecified lump in the right breast, unspecified quadrant: Secondary | ICD-10-CM

## 2023-02-07 ENCOUNTER — Other Ambulatory Visit: Payer: Medicaid Other

## 2023-02-07 ENCOUNTER — Ambulatory Visit
Admission: RE | Admit: 2023-02-07 | Discharge: 2023-02-07 | Disposition: A | Discharge status: Home / Self Care | Payer: Medicaid Other | Source: Ambulatory Visit | Attending: Emergency Medicine | Admitting: Emergency Medicine

## 2023-02-07 DIAGNOSIS — N6310 Unspecified lump in the right breast, unspecified quadrant: Secondary | ICD-10-CM

## 2023-02-12 DIAGNOSIS — J069 Acute upper respiratory infection, unspecified: Secondary | ICD-10-CM | POA: Insufficient documentation

## 2023-02-26 NOTE — Progress Notes (Signed)
Patient ID: Ebony Snyder, female    DOB: Aug 18, 1996  MRN: [id number]  CC: Follow-Up  Subjective: Ebony Snyder is a 27 y.o. female who presents for follow-up.   Her concerns today include:  - Patient reports bilateral lower abdominal pain radiating to bilateral pelvic began November 2021. Reports when she was pregnant in 2022 symptoms resolved. Reports 1 year after delivery symptoms resumed and persisting since then. She denies associated red flag symptoms. Reports feels like "period cramps all of the time". Reports she does have normal periods. She was established with Gynecology in the past. Requests second opinion referral to Gynecology. - Requests routine STD screening and herpes screening. Reports scar near buttocks for several days with no additional symptoms. She denies additional symptoms. - No further issues/concerns for discussion today.  Patient Active Problem List   Diagnosis Date Noted   Lump in lower inner quadrant of right breast 01/15/2022   Anemia, postpartum 04/05/2021   History of cesarean delivery 02/21/2021   History of gestational hypertension 02/20/2021   LGA (large for gestational age) fetus affecting management of mother 01/07/2021   History of gestational diabetes 12/16/2020   Genetic carrier 11/16/2020     Current Outpatient Medications on File Prior to Visit  Medication Sig Dispense Refill   ibuprofen (ADVIL) 800 MG tablet Take 1 tablet (800 mg total) by mouth every 8 (eight) hours as needed. (Patient not taking: Reported on 03/05/2023) 30 tablet 5   metroNIDAZOLE (METROGEL) 0.75 % vaginal gel Place 1 Applicatorful vaginally 2 (two) times daily. For 5 days. (Patient not taking: Reported on 03/05/2023) 70 g 2   [DISCONTINUED] albuterol (PROVENTIL HFA;VENTOLIN HFA) 108 (90 Base) MCG/ACT inhaler Inhale 2 puffs into the lungs every 4 (four) hours as needed for wheezing or shortness of breath (cough, shortness of breath or wheezing.). (Patient not taking:  Reported on 06/02/2018) 1 Inhaler 5   No current facility-administered medications on file prior to visit.    No Known Allergies  Social History   Socioeconomic History   Marital status: Single    Spouse name: Not on file   Number of children: Not on file   Years of education: Not on file   Highest education level: Not on file  Occupational History   Not on file  Tobacco Use   Smoking status: Never   Smokeless tobacco: Never  Vaping Use   Vaping Use: Never used  Substance and Sexual Activity   Alcohol use: Not Currently    Comment: occ   Drug use: No   Sexual activity: Yes    Comment: condoms 50% of the time  Other Topics Concern   Not on file  Social History Narrative   Not on file   Social Determinants of Health   Financial Resource Strain: Not on file  Food Insecurity: Not on file  Transportation Needs: Not on file  Physical Activity: Not on file  Stress: Not on file  Social Connections: Not on file  Intimate Partner Violence: Not on file    Family History  Problem Relation Age of Onset   Hypertension Mother    Hypertension Father    Asthma Sister    Diabetes Maternal Grandmother    Early death Maternal Grandmother     Past Surgical History:  Procedure Laterality Date   CESAREAN SECTION N/A 02/21/2021   Procedure: CESAREAN SECTION;  Surgeon: [place] Bing, MD;  Location: [facility] LD ORS;  Service: Obstetrics;  Laterality: N/A;  Arrest of Dilation  MOUTH SURGERY     NO PAST SURGERIES     WISDOM TOOTH EXTRACTION      ROS: Review of Systems Negative except as stated above  PHYSICAL EXAM: BP 112/75 (BP Location: Left Arm, Patient Position: Sitting, Cuff Size: Large)   Pulse 75   SpO2 93%   Physical Exam HENT:     Head: Normocephalic and atraumatic.  Eyes:     Extraocular Movements: Extraocular movements intact.     Conjunctiva/sclera: Conjunctivae normal.     Pupils: Pupils are equal, round, and reactive to light.  Cardiovascular:     Rate and  Rhythm: Normal rate and regular rhythm.     Pulses: Normal pulses.     Heart sounds: Normal heart sounds.  Pulmonary:     Effort: Pulmonary effort is normal.     Breath sounds: Normal breath sounds.  Abdominal:     General: Bowel sounds are normal.     Palpations: Abdomen is soft.  Genitourinary:    Comments: Patient declined.  Musculoskeletal:     Cervical back: Normal range of motion and neck supple.  Neurological:     General: No focal deficit present.     Mental Status: She is alert and oriented to person, place, and time.  Psychiatric:        Mood and Affect: Mood normal.        Behavior: Behavior normal.     ASSESSMENT AND PLAN: 1. Routine screening for STI (sexually transmitted infection) - Routine screening.  - Cervicovaginal ancillary only - POCT URINALYSIS DIP ([facility]IN[facility]K); Future - HSV 1 and 2 Ab, IgG  2. Chronic abdominal pain - Routine screening.  - Referral to Gastroenterology for further evaluation/management. During the interim follow-up with primary provider as scheduled.  - CMP14+EGFR - Amylase - Lipase - POCT urine pregnancy; Future - Ambulatory referral to Gastroenterology  3. Pelvic pain - Referral to Gynecology for further evaluation/management. During the interim follow-up with primary provider as scheduled.  - Ambulatory referral to Gynecology   Patient was given the opportunity to ask questions.  Patient verbalized understanding of the plan and was able to repeat key elements of the plan. Patient was given clear instructions to go to Emergency Department or return to medical center if symptoms don't improve, worsen, or new problems develop.The patient verbalized understanding.   Orders Placed This Encounter  Procedures   HSV 1 and 2 Ab, IgG   CMP14+EGFR   Amylase   Lipase   Ambulatory referral to Gastroenterology   Ambulatory referral to Gynecology   POCT URINALYSIS DIP (CLINITEK)   POCT urine pregnancy    Return for Follow-Up or next  available with Georganna Skeans, MD .  Rema Fendt, NP

## 2023-03-05 ENCOUNTER — Ambulatory Visit (INDEPENDENT_AMBULATORY_CARE_PROVIDER_SITE_OTHER): Payer: Medicaid Other | Admitting: Family

## 2023-03-05 ENCOUNTER — Encounter: Payer: Self-pay | Admitting: Family

## 2023-03-05 VITALS — BP 112/75 | HR 75

## 2023-03-05 DIAGNOSIS — G8929 Other chronic pain: Secondary | ICD-10-CM

## 2023-03-05 DIAGNOSIS — Z3202 Encounter for pregnancy test, result negative: Secondary | ICD-10-CM

## 2023-03-05 DIAGNOSIS — R102 Pelvic and perineal pain: Secondary | ICD-10-CM | POA: Diagnosis not present

## 2023-03-05 DIAGNOSIS — Z113 Encounter for screening for infections with a predominantly sexual mode of transmission: Secondary | ICD-10-CM

## 2023-03-05 DIAGNOSIS — R109 Unspecified abdominal pain: Secondary | ICD-10-CM | POA: Diagnosis not present

## 2023-03-05 LAB — POCT URINALYSIS DIP (CLINITEK)
Bilirubin, UA: NEGATIVE
Blood, UA: NEGATIVE
Glucose, UA: NEGATIVE mg/dL
Nitrite, UA: NEGATIVE
POC PROTEIN,UA: 100 — AB
Spec Grav, UA: >=1.03 — AB (ref 1.010–1.025)
Urobilinogen, UA: 1 E.U./dL
pH, UA: 7 (ref 5.0–8.0)

## 2023-03-05 LAB — POCT URINE PREGNANCY: Preg Test, Ur: NEGATIVE

## 2023-03-05 NOTE — Progress Notes (Signed)
Patient here to follow up on abdominal cramping as well as STD testing. States that she had a scar near her anus for 3-4 days and is wanting to get tested for herpes

## 2023-03-06 ENCOUNTER — Telehealth: Payer: Self-pay

## 2023-03-06 ENCOUNTER — Other Ambulatory Visit: Payer: Self-pay | Admitting: Family

## 2023-03-06 DIAGNOSIS — B009 Herpesviral infection, unspecified: Secondary | ICD-10-CM | POA: Insufficient documentation

## 2023-03-06 LAB — HSV 1 AND 2 AB, IGG
HSV 1 Glycoprotein G Ab, IgG: 23.1 index — ABNORMAL HIGH (ref 0.00–0.90)
HSV 2 IgG, Type Spec: 13.8 index — ABNORMAL HIGH (ref 0.00–0.90)

## 2023-03-06 LAB — CMP14+EGFR
ALT: 15 IU/L (ref 0–32)
AST: 17 IU/L (ref 0–40)
Albumin/Globulin Ratio: 1.6 (ref 1.2–2.2)
Albumin: 4.4 g/dL (ref 4.0–5.0)
Alkaline Phosphatase: 66 IU/L (ref 44–121)
BUN/Creatinine Ratio: 18 (ref 9–23)
BUN: 13 mg/dL (ref 6–20)
Bilirubin Total: 0.4 mg/dL (ref 0.0–1.2)
CO2: 21 mmol/L (ref 20–29)
Calcium: 10 mg/dL (ref 8.7–10.2)
Chloride: 105 mmol/L (ref 96–106)
Creatinine, Ser: 0.73 mg/dL (ref 0.57–1.00)
Globulin, Total: 2.8 g/dL (ref 1.5–4.5)
Glucose: 115 mg/dL — ABNORMAL HIGH (ref 70–99)
Potassium: 4.1 mmol/L (ref 3.5–5.2)
Sodium: 140 mmol/L (ref 134–144)
Total Protein: 7.2 g/dL (ref 6.0–8.5)
eGFR: 115 mL/min/{1.73_m2} (ref >59)

## 2023-03-06 LAB — LIPASE: Lipase: 38 U/L (ref 14–72)

## 2023-03-06 LAB — AMYLASE: Amylase: 79 U/L (ref 31–110)

## 2023-03-06 MED ORDER — VALACYCLOVIR HCL 1 G PO TABS
1000.0000 mg | ORAL_TABLET | Freq: Two times a day (BID) | ORAL | 0 refills | Status: DC
Start: 2023-03-06 — End: 2023-07-04

## 2023-03-06 NOTE — Telephone Encounter (Signed)
Pt was called and no vm was left due to mailbox being full. Information has been sent to nurse pool.  

## 2023-03-06 NOTE — Telephone Encounter (Signed)
-----   Message from Rema Fendt, NP sent at 03/05/2023  2:32 PM EDT ----- - Urine pregnancy negative.  - No urinary tract infection.

## 2023-03-06 NOTE — Telephone Encounter (Signed)
-----   Message from Rema Fendt, NP sent at 03/06/2023  7:50 AM EDT ----- - Kidney function and electrolytes normal. - Liver function normal. - Amylase normal.  - Lipase normal.  - Valacyclovir prescribed for herpes.

## 2023-03-20 ENCOUNTER — Ambulatory Visit: Payer: Medicaid Other | Admitting: Family Medicine

## 2023-03-27 ENCOUNTER — Other Ambulatory Visit (HOSPITAL_COMMUNITY)
Admission: RE | Admit: 2023-03-27 | Discharge: 2023-03-27 | Disposition: A | Discharge status: Home / Self Care | Payer: Medicaid Other | Source: Ambulatory Visit | Attending: Family Medicine | Admitting: Family Medicine

## 2023-03-27 ENCOUNTER — Ambulatory Visit (INDEPENDENT_AMBULATORY_CARE_PROVIDER_SITE_OTHER): Payer: Medicaid Other | Admitting: Family Medicine

## 2023-03-27 VITALS — BP 115/76 | HR 70 | Temp 98.1°F | Resp 16 | Wt 182.8 lb

## 2023-03-27 DIAGNOSIS — N760 Acute vaginitis: Secondary | ICD-10-CM | POA: Insufficient documentation

## 2023-03-28 LAB — CERVICOVAGINAL ANCILLARY ONLY
Bacterial Vaginitis (gardnerella): POSITIVE — AB
Candida Glabrata: NEGATIVE
Candida Vaginitis: POSITIVE — AB
Chlamydia: NEGATIVE
Comment: NEGATIVE
Comment: NEGATIVE
Comment: NEGATIVE
Comment: NEGATIVE
Comment: NEGATIVE
Comment: NORMAL
Neisseria Gonorrhea: POSITIVE — AB
Trichomonas: NEGATIVE

## 2023-03-28 NOTE — Progress Notes (Signed)
Patient is here for stomach /abdomen problems that has been present for awhile. Patient is concern about recurring BV

## 2023-03-29 ENCOUNTER — Encounter: Payer: Self-pay | Admitting: Family Medicine

## 2023-03-29 ENCOUNTER — Other Ambulatory Visit: Payer: Self-pay | Admitting: Family Medicine

## 2023-03-29 MED ORDER — DOXYCYCLINE HYCLATE 100 MG PO TABS: 100.0000 mg | ORAL_TABLET | Freq: Two times a day (BID) | ORAL | 0 refills | Status: DC

## 2023-03-29 MED ORDER — FLUCONAZOLE 150 MG PO TABS: 150.0000 mg | ORAL_TABLET | Freq: Once | ORAL | 0 refills | Status: AC

## 2023-03-29 MED ORDER — METRONIDAZOLE 500 MG PO TABS: 500.0000 mg | ORAL_TABLET | Freq: Two times a day (BID) | ORAL | 0 refills | Status: AC

## 2023-03-29 NOTE — Progress Notes (Signed)
Established Patient Office Visit  Subjective    Patient ID: Ebony Snyder, female    DOB: 1996-10-19  Age: 27 y.o. MRN: [id number]  CC:  Chief Complaint  Patient presents with   Follow-up    HPI Ebony Snyder presents for complaint of pelvic pain. She also reports persistent vaginal sx.    Outpatient Encounter Medications as of 03/27/2023  Medication Sig   ibuprofen (ADVIL) 800 MG tablet Take 1 tablet (800 mg total) by mouth every 8 (eight) hours as needed.   [DISCONTINUED] albuterol (PROVENTIL HFA;VENTOLIN HFA) 108 (90 Base) MCG/ACT inhaler Inhale 2 puffs into the lungs every 4 (four) hours as needed for wheezing or shortness of breath (cough, shortness of breath or wheezing.). (Patient not taking: Reported on 06/02/2018)   [DISCONTINUED] metroNIDAZOLE (METROGEL) 0.75 % vaginal gel Place 1 Applicatorful vaginally 2 (two) times daily. For 5 days. (Patient not taking: Reported on 03/05/2023)   No facility-administered encounter medications on file as of 03/27/2023.    Past Medical History:  Diagnosis Date   Anxiety    Concussion    Depression    denies   Diabetes mellitus without complication (HCC)    Dysmenorrhea    Migraine without aura    STD (sexually transmitted disease)     Past Surgical History:  Procedure Laterality Date   CESAREAN SECTION N/A 02/21/2021   Procedure: CESAREAN SECTION;  Surgeon: Pillow Bing, MD;  Location: [facility];  Service: Obstetrics;  Laterality: N/A;  Arrest of Dilation   MOUTH SURGERY     NO PAST SURGERIES     WISDOM TOOTH EXTRACTION      Family History  Problem Relation Age of Onset   Hypertension Mother    Hypertension Father    Asthma Sister    Diabetes Maternal Grandmother    Early death Maternal Grandmother     Social History   Socioeconomic History   Marital status: Single    Spouse name: Not on file   Number of children: Not on file   Years of education: Not on file   Highest education level: Not on file   Occupational History   Not on file  Tobacco Use   Smoking status: Never   Smokeless tobacco: Never  Vaping Use   Vaping Use: Never used  Substance and Sexual Activity   Alcohol use: Not Currently    Comment: occ   Drug use: No   Sexual activity: Yes    Comment: condoms 50% of the time  Other Topics Concern   Not on file  Social History Narrative   Not on file   Social Determinants of Health   Financial Resource Strain: Not on file  Food Insecurity: Not on file  Transportation Needs: Not on file  Physical Activity: Not on file  Stress: Not on file  Social Connections: Not on file  Intimate Partner Violence: Not on file    Review of Systems  Genitourinary:  Negative for dysuria.  All other systems reviewed and are negative.       Objective    BP 115/76   Pulse 70   Temp 98.1 F (36.7 C) (Oral)   Resp 16   Wt 182 lb 12.8 oz (82.9 kg)   SpO2 92%   BMI 33.43 kg/m   Physical Exam Vitals and nursing note reviewed.  Constitutional:      General: She is not in acute distress. Cardiovascular:     Rate and Rhythm: Normal rate and regular  rhythm.  Pulmonary:     Effort: Pulmonary effort is normal.     Breath sounds: Normal breath sounds.  Abdominal:     Palpations: Abdomen is soft.     Tenderness: There is no abdominal tenderness.  Neurological:     General: No focal deficit present.     Mental Status: She is alert and oriented to person, place, and time.         Assessment & Plan:   1. Vaginosis Results pending - Cervicovaginal ancillary only    No follow-ups on file.   Tommie Raymond, MD

## 2023-04-02 ENCOUNTER — Ambulatory Visit (INDEPENDENT_AMBULATORY_CARE_PROVIDER_SITE_OTHER): Payer: Medicaid Other

## 2023-04-02 DIAGNOSIS — A549 Gonococcal infection, unspecified: Secondary | ICD-10-CM | POA: Diagnosis not present

## 2023-04-02 MED ORDER — CEFTRIAXONE SODIUM 250 MG IJ SOLR
250.0000 mg | Freq: Once | INTRAMUSCULAR | Status: DC
Start: 2023-04-02 — End: 2023-11-12

## 2023-04-02 MED ORDER — CEFTRIAXONE SODIUM 250 MG IJ SOLR
250.0000 mg | Freq: Once | INTRAMUSCULAR | Status: AC
Start: 2023-04-02 — End: 2023-04-02
  Administered 2023-04-02: 250 mg via INTRAMUSCULAR

## 2023-04-02 NOTE — Progress Notes (Unsigned)
Patient came in for ceftriaxone 500mg  for gonorrhea positive lab.  Patient tolerated it well

## 2023-04-16 ENCOUNTER — Ambulatory Visit (HOSPITAL_COMMUNITY)
Admission: EM | Admit: 2023-04-16 | Discharge: 2023-04-16 | Disposition: A | Discharge status: Home / Self Care | Payer: Medicaid Other | Attending: Emergency Medicine | Admitting: Emergency Medicine

## 2023-04-16 ENCOUNTER — Encounter (HOSPITAL_COMMUNITY): Payer: Self-pay | Admitting: *Deleted

## 2023-04-16 DIAGNOSIS — R1030 Lower abdominal pain, unspecified: Secondary | ICD-10-CM | POA: Diagnosis present

## 2023-04-16 DIAGNOSIS — Z3201 Encounter for pregnancy test, result positive: Secondary | ICD-10-CM | POA: Insufficient documentation

## 2023-04-16 LAB — POCT URINE PREGNANCY: Preg Test, Ur: POSITIVE — AB

## 2023-04-16 MED ORDER — CLOTRIMAZOLE 1 % VA CREA: 1.0000 | TOPICAL_CREAM | Freq: Every day | VAGINAL | 0 refills | Status: DC

## 2023-04-16 MED ORDER — METRONIDAZOLE 500 MG PO TABS: 500.0000 mg | ORAL_TABLET | Freq: Two times a day (BID) | ORAL | 0 refills | Status: DC

## 2023-04-16 NOTE — ED Provider Notes (Signed)
[facility]    CSN: [id number] Arrival date & time: 04/16/23  1924      History   Chief Complaint Chief Complaint  Patient presents with   SEXUALLY TRANSMITTED DISEASE    HPI Ebony Snyder is a 27 y.o. female.   Patient presents for evaluation of constant centralized lower abdominal pain and mild vaginal itching described as cramping present for 3 weeks.  Was evaluated on 03/27/2023, treated for yeast, BV and gonorrhea, did not take all of metronidazole tablets.  Requesting pregnancy test, last known menstrual period 03/17/2023, endorses that around 03/27/2023 she had bleeding for 2 to 3 days which is irregular.  Denies use of birth control, denies use of emergency contraceptive.  Denies vaginal discharge, new rash or lesions, urinary symptoms, flank pain.   Past Medical History:  Diagnosis Date   Anxiety    Concussion    Depression    denies   Diabetes mellitus without complication (HCC)    Dysmenorrhea    Migraine without aura    STD (sexually transmitted disease)     Patient Active Problem List   Diagnosis Date Noted   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   Acute upper respiratory infection 02/12/2023   Lump in lower inner quadrant of right breast 01/15/2022   Anemia, postpartum 04/05/2021   History of cesarean delivery 02/21/2021   History of gestational hypertension 02/20/2021   LGA (large for gestational age) fetus affecting management of mother 01/07/2021   History of gestational diabetes 12/16/2020   Genetic carrier 11/16/2020    Past Surgical History:  Procedure Laterality Date   CESAREAN SECTION N/A 02/21/2021   Procedure: CESAREAN SECTION;  Surgeon: Sanford Bing, MD;  Location: [facility];  Service: Obstetrics;  Laterality: N/A;  Arrest of Dilation   MOUTH SURGERY     NO PAST SURGERIES     WISDOM TOOTH EXTRACTION      OB History     Gravida  1   Para  1   Term  1   Preterm      AB      Living  1      SAB       IAB      Ectopic      Multiple  0   Live Births  1            Home Medications    Prior to Admission medications   Medication Sig Start Date End Date Taking? Authorizing Provider  doxycycline (VIBRA-TABS) 100 MG tablet Take 1 tablet (100 mg total) by mouth 2 (two) times daily. 03/29/23   Georganna Skeans, MD  ibuprofen (ADVIL) 800 MG tablet Take 1 tablet (800 mg total) by mouth every 8 (eight) hours as needed. 08/15/22   Brock Bad, MD  albuterol (PROVENTIL HFA;VENTOLIN HFA) 108 (90 Base) MCG/ACT inhaler Inhale 2 puffs into the lungs every 4 (four) hours as needed for wheezing or shortness of breath (cough, shortness of breath or wheezing.). Patient not taking: Reported on 06/02/2018 08/23/16 10/20/19  Elvina Sidle, MD    Family History Family History  Problem Relation Age of Onset   Hypertension Mother    Hypertension Father    Asthma Sister    Diabetes Maternal Grandmother    Early death Maternal Grandmother     Social History Social History   Tobacco Use   Smoking status: Never   Smokeless tobacco: Never  Vaping Use   Vaping Use: Never used  Substance  Use Topics   Alcohol use: Not Currently    Comment: occ   Drug use: No     Allergies   Patient has no known allergies.   Review of Systems Review of Systems   Physical Exam Triage Vital Signs ED Triage Vitals  Enc Vitals Group     BP 04/16/23 1940 124/76     Pulse Rate 04/16/23 1940 80     Resp 04/16/23 1940 18     Temp 04/16/23 1940 100.1 F (37.8 C)     Temp Source 04/16/23 1940 Oral     SpO2 04/16/23 1940 97 %     Weight --      Height --      Head Circumference --      Peak Flow --      Pain Score 04/16/23 1938 9     Pain Loc --      Pain Edu? --      Excl. in GC? --    No data found.  Updated Vital Signs BP 124/76 (BP Location: Right Arm)   Pulse 80   Temp 100.1 F (37.8 C) (Oral)   Resp 18   LMP 03/17/2023   SpO2 97%   Visual Acuity Right Eye Distance:   Left Eye  Distance:   Bilateral Distance:    Right Eye Near:   Left Eye Near:    Bilateral Near:     Physical Exam Constitutional:      Appearance: Normal appearance.  Eyes:     Extraocular Movements: Extraocular movements intact.  Pulmonary:     Effort: Pulmonary effort is normal.  Genitourinary:    Comments: deferred Neurological:     Mental Status: She is alert and oriented to person, place, and time. Mental status is at baseline.      UC Treatments / Results  Labs (all labs ordered are listed, but only abnormal results are displayed) Labs Reviewed  POCT URINE PREGNANCY - Abnormal; Notable for the following components:      Result Value   Preg Test, Ur Positive (*)    All other components within normal limits  CERVICOVAGINAL ANCILLARY ONLY    EKG   Radiology No results found.  Procedures Procedures (including critical care time)  Medications Ordered in UC Medications - No data to display  Initial Impression / Assessment and Plan / UC Course  I have reviewed the triage vital signs and the nursing notes.  Pertinent labs & imaging results that were available during my care of the patient were reviewed by me and considered in my medical decision making (see chart for details).  Positive pregnancy test, lower abdominal pain  Urine pregnancy positive, discussed findings with patient, additional bleeding throughout the month was most likely implantation, is established with gynecology, and advised to notify of moving forward with pregnancy, may begin use of prenatals, advised to alcohol or tobacco use and secondhand smoke, advised against use of NSAIDs, given list of safe over-the-counter medications, advised to begin use of prenatal, given precautions for signs of miscarriage to go to the nearest emergency department for immediate evaluation  As she did not complete recent BV treatment, will restart, metronidazole prescribed and clotrimazole vaginal suppository for management  of possible yeast, STI labs are pending, will treat further per results, advised abstinence until lab results, treatment is complete and all symptoms have resolved Final Clinical Impressions(s) / UC Diagnoses   Final diagnoses:  None     Discharge Instructions  ED Prescriptions   None    PDMP not reviewed this encounter.   Valinda Hoar, [place] 04/17/23 [phone]

## 2023-04-16 NOTE — ED Triage Notes (Signed)
Pt states she was seen 03/27/2023 and treated for yeast, BV and gonorrhea but since treatment she hasn't had an relief. She has pressure in her vaginal. She did miss a couple of doses of her antibiotics.   She would also like a pregnancy test done.

## 2023-04-16 NOTE — Discharge Instructions (Addendum)
Today you are being treated prophylactically for  Bacterial vaginosis and yeast  Urine pregnancy test is negative  Take Metronidazole 500 mg twice a day for 7 days, do not drink alcohol while using medication, this will make you feel sick, do not skip dosages  Apply cream every night for 7 days for treatment of yeast  Bacterial vaginosis which results from an overgrowth of one on several organisms that are normally present in your vagina. Vaginosis is an inflammation of the vagina that can result in discharge, itching and pain.  Labs pending 2-3 days, you will be contacted if positive for any sti and treatment will be sent to the pharmacy, you will have to return to the clinic if positive for gonorrhea to receive treatment   Please refrain from having sex until labs results, if positive please refrain from having sex until treatment complete and symptoms resolve   If positive for HIV, Syphilis, Chlamydia  gonorrhea or trichomoniasis please notify partner or partners so they may tested as well  Moving forward, it is recommended you use some form of protection against the transmission of sti infections  such as condoms or dental dams with each sexual encounter     In addition: Avoid baths, hot tubs and whirlpool spas.  Don't use scented or harsh soaps Avoid irritants. These include scented tampons and pads. Wipe from front to back after using the toilet. Don't douche. Your vagina doesn't require cleansing other than normal bathing.  Use a condom.  Wear cotton underwear, this fabric absorbs some moisture. \  Your pregnancy test today is positive.   Your will need evaluation from a women's health provider to determine gestational age( how far along you are) and estimated due date. This is necessary whether you determine to move forward with pregnancy or not.    If moving forward with pregnancy, you may start taking prenatal vitamin daily.   If you do, please stop drinking alcohol or  smoking nicotine substances (cigarettes, vaping and hookah)  please stop any illicit drug use including marijuana, if this is a struggle for you, please notify for your women's health providers so they may help you   Inside your packet is a list of medications that are safe for you to use.

## 2023-04-17 LAB — CERVICOVAGINAL ANCILLARY ONLY
Bacterial Vaginitis (gardnerella): NEGATIVE
Candida Glabrata: NEGATIVE
Candida Vaginitis: NEGATIVE
Chlamydia: NEGATIVE
Comment: NEGATIVE
Comment: NEGATIVE
Comment: NEGATIVE
Comment: NEGATIVE
Comment: NEGATIVE
Comment: NORMAL
Neisseria Gonorrhea: NEGATIVE
Trichomonas: NEGATIVE

## 2023-05-28 ENCOUNTER — Ambulatory Visit: Payer: Medicaid Other | Admitting: Family Medicine

## 2023-05-30 ENCOUNTER — Ambulatory Visit
Admission: EM | Admit: 2023-05-30 | Discharge: 2023-05-30 | Disposition: A | Discharge status: Home / Self Care | Payer: Medicaid Other | Attending: Physician Assistant | Admitting: Physician Assistant

## 2023-05-30 ENCOUNTER — Ambulatory Visit (HOSPITAL_COMMUNITY)
Admission: EM | Admit: 2023-05-30 | Discharge: 2023-05-30 | Disposition: A | Discharge status: Home / Self Care | Payer: Medicaid Other

## 2023-05-30 DIAGNOSIS — Z113 Encounter for screening for infections with a predominantly sexual mode of transmission: Secondary | ICD-10-CM | POA: Insufficient documentation

## 2023-05-30 NOTE — ED Triage Notes (Signed)
Pt c/o chronic abdominal pain since 2021. States had STD 2 months ago and was having abdominal. States had an abortion 3 wks ago and the cramping is more tense. States having worse abdominal cramping and vaginal odor x1.5 wks. Requesting STD testing.

## 2023-05-30 NOTE — ED Provider Notes (Signed)
[facility]    CSN: [id number] Arrival date & time: 05/30/23  1033      History   Chief Complaint Chief Complaint  Patient presents with   Abdominal Pain   SEXUALLY TRANSMITTED DISEASE    HPI Ebony Snyder is a 27 y.o. female.   Patient here today for evaluation of pelvic pain and cramping in her groin area that has been ongoing for several years  but seemed to worsen after an abortion 3 weeks ago. She has had vaginal odor but no vaginal discharge. She would like STD screening if possible- she declines blood work as she has had this recently. She has not had any genital lesions or rashes.   The history is provided by the patient.  Abdominal Pain Associated symptoms: no chills, no dysuria, no fever, no nausea, no vaginal discharge and no vomiting     Past Medical History:  Diagnosis Date   Anxiety    Concussion    Depression    denies   Diabetes mellitus without complication (HCC)    Dysmenorrhea    Migraine without aura    STD (sexually transmitted disease)     Patient Active Problem List   Diagnosis Date Noted   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   Acute upper respiratory infection 02/12/2023   Lump in lower inner quadrant of right breast 01/15/2022   Anemia, postpartum 04/05/2021   History of cesarean delivery 02/21/2021   History of gestational hypertension 02/20/2021   LGA (large for gestational age) fetus affecting management of mother 01/07/2021   History of gestational diabetes 12/16/2020   Genetic carrier 11/16/2020    Past Surgical History:  Procedure Laterality Date   CESAREAN SECTION N/A 02/21/2021   Procedure: CESAREAN SECTION;  Surgeon: Sharon Bing, MD;  Location: [facility];  Service: Obstetrics;  Laterality: N/A;  Arrest of Dilation   MOUTH SURGERY     NO PAST SURGERIES     WISDOM TOOTH EXTRACTION      OB History     Gravida  1   Para  1   Term  1   Preterm      AB      Living  1      SAB       IAB      Ectopic      Multiple  0   Live Births  1            Home Medications    Prior to Admission medications   Medication Sig Start Date End Date Taking? Authorizing Provider  clotrimazole (GYNE-LOTRIMIN) 1 % vaginal cream Place 1 Applicatorful vaginally at bedtime. 04/16/23   White, Elita Boone, NP  albuterol (PROVENTIL HFA;VENTOLIN HFA) 108 (90 Base) MCG/ACT inhaler Inhale 2 puffs into the lungs every 4 (four) hours as needed for wheezing or shortness of breath (cough, shortness of breath or wheezing.). Patient not taking: Reported on 06/02/2018 08/23/16 10/20/19  Elvina Sidle, MD    Family History Family History  Problem Relation Age of Onset   Hypertension Mother    Hypertension Father    Asthma Sister    Diabetes Maternal Grandmother    Early death Maternal Grandmother     Social History Social History   Tobacco Use   Smoking status: Never   Smokeless tobacco: Never  Vaping Use   Vaping status: Never Used  Substance Use Topics   Alcohol use: Not Currently    Comment: occ   Drug  use: No     Allergies   Patient has no known allergies.   Review of Systems Review of Systems  Constitutional:  Negative for chills and fever.  Eyes:  Negative for discharge and redness.  Gastrointestinal:  Negative for abdominal pain, nausea and vomiting.  Genitourinary:  Negative for dysuria and vaginal discharge.     Physical Exam Triage Vital Signs ED Triage Vitals  Encounter Vitals Group     BP 05/30/23 1041 (!) 141/98     Systolic BP Percentile --      Diastolic BP Percentile --      Pulse Rate 05/30/23 1041 66     Resp 05/30/23 1041 18     Temp 05/30/23 1041 98.6 F (37 C)     Temp Source 05/30/23 1041 Oral     SpO2 05/30/23 1041 97 %     Weight --      Height --      Head Circumference --      Peak Flow --      Pain Score 05/30/23 1042 9     Pain Loc --      Pain Education --      Exclude from Growth Chart --    No data found.  Updated  Vital Signs BP (!) 141/98 (BP Location: Left Arm)   Pulse 66   Temp 98.6 F (37 C) (Oral)   Resp 18   SpO2 97%      Physical Exam Vitals and nursing note reviewed.  Constitutional:      General: She is not in acute distress.    Appearance: Normal appearance. She is not ill-appearing.  HENT:     Head: Normocephalic and atraumatic.  Eyes:     Conjunctiva/sclera: Conjunctivae normal.  Cardiovascular:     Rate and Rhythm: Normal rate and regular rhythm.  Pulmonary:     Effort: Pulmonary effort is normal. No respiratory distress.     Breath sounds: Normal breath sounds. No wheezing, rhonchi or rales.  Abdominal:     General: Abdomen is flat. Bowel sounds are normal.     Palpations: Abdomen is soft.     Tenderness: There is no abdominal tenderness.  Neurological:     Mental Status: She is alert.  Psychiatric:        Mood and Affect: Mood normal.        Behavior: Behavior normal.        Thought Content: Thought content normal.      UC Treatments / Results  Labs (all labs ordered are listed, but only abnormal results are displayed) Labs Reviewed  CERVICOVAGINAL ANCILLARY ONLY    EKG   Radiology No results found.  Procedures Procedures (including critical care time)  Medications Ordered in UC Medications - No data to display  Initial Impression / Assessment and Plan / UC Course  I have reviewed the triage vital signs and the nursing notes.  Pertinent labs & imaging results that were available during my care of the patient were reviewed by me and considered in my medical decision making (see chart for details).    Will order STD screening. Recommended further evaluation by GYN given chronicity of symptoms but did recommend further evaluation in the ED if cramping worsens in any way. Patient expresses understanding.   Final Clinical Impressions(s) / UC Diagnoses   Final diagnoses:  Screening for STD (sexually transmitted disease)   Discharge Instructions    None    ED Prescriptions   None  PDMP not reviewed this encounter.   Tomi Bamberger, PA-C 05/30/23 1133

## 2023-05-31 LAB — CERVICOVAGINAL ANCILLARY ONLY: Chlamydia: NEGATIVE

## 2023-06-03 ENCOUNTER — Telehealth: Payer: Self-pay

## 2023-06-03 MED ORDER — METRONIDAZOLE 500 MG PO TABS: 500.0000 mg | ORAL_TABLET | Freq: Two times a day (BID) | ORAL | 0 refills | Status: AC

## 2023-06-03 NOTE — Telephone Encounter (Signed)
Per protocol pt requires tx with metronidazole. Attempted to reach patient x1. LVM. Rx sent to pharmacy on file.

## 2023-07-04 ENCOUNTER — Other Ambulatory Visit: Payer: Self-pay | Admitting: *Deleted

## 2023-07-04 ENCOUNTER — Telehealth: Payer: Self-pay | Admitting: Family Medicine

## 2023-07-04 DIAGNOSIS — B009 Herpesviral infection, unspecified: Secondary | ICD-10-CM

## 2023-07-04 MED ORDER — VALACYCLOVIR HCL 1 G PO TABS
1000.0000 mg | ORAL_TABLET | Freq: Two times a day (BID) | ORAL | 0 refills | Status: DC
Start: 2023-07-04 — End: 2023-07-19

## 2023-07-04 NOTE — Telephone Encounter (Signed)
Medication sent.

## 2023-07-04 NOTE — Telephone Encounter (Signed)
Copied from CRM [id number]. Topic: General - Other >> Jul 03, 2023 11:12 AM Phill Myron wrote: Please call Ms Paolo, regarding a previous medication she had taken but does not see on her med list. Please advise

## 2023-07-19 ENCOUNTER — Telehealth: Payer: Self-pay | Admitting: Family Medicine

## 2023-07-19 ENCOUNTER — Other Ambulatory Visit: Payer: Self-pay | Admitting: *Deleted

## 2023-07-19 DIAGNOSIS — B009 Herpesviral infection, unspecified: Secondary | ICD-10-CM

## 2023-07-19 MED ORDER — VALACYCLOVIR HCL 1 G PO TABS
1000.0000 mg | ORAL_TABLET | Freq: Two times a day (BID) | ORAL | 0 refills | Status: AC
Start: 2023-07-19 — End: 2023-07-29

## 2023-07-19 NOTE — Telephone Encounter (Signed)
done

## 2023-07-19 NOTE — Telephone Encounter (Signed)
Patient called stated she need provider to call her back about a refill but she doesn't know the name of the medication and she is not having any symptoms. No other info given. Please f/u with patient

## 2023-08-28 ENCOUNTER — Encounter: Payer: Self-pay | Admitting: Family

## 2023-08-28 ENCOUNTER — Other Ambulatory Visit: Payer: Self-pay | Admitting: Family

## 2023-08-28 ENCOUNTER — Ambulatory Visit (INDEPENDENT_AMBULATORY_CARE_PROVIDER_SITE_OTHER): Payer: Medicaid Other | Admitting: Family

## 2023-08-28 ENCOUNTER — Other Ambulatory Visit (HOSPITAL_COMMUNITY)
Admission: RE | Admit: 2023-08-28 | Discharge: 2023-08-28 | Disposition: A | Discharge status: Home / Self Care | Payer: Medicaid Other | Source: Ambulatory Visit | Attending: Family | Admitting: Family

## 2023-08-28 VITALS — BP 122/84 | HR 72 | Temp 98.8°F | Ht 62.0 in | Wt 189.4 lb

## 2023-08-28 DIAGNOSIS — Z30016 Encounter for initial prescription of transdermal patch hormonal contraceptive device: Secondary | ICD-10-CM

## 2023-08-28 DIAGNOSIS — N898 Other specified noninflammatory disorders of vagina: Secondary | ICD-10-CM | POA: Diagnosis present

## 2023-08-28 DIAGNOSIS — Z114 Encounter for screening for human immunodeficiency virus [HIV]: Secondary | ICD-10-CM

## 2023-08-28 DIAGNOSIS — N949 Unspecified condition associated with female genital organs and menstrual cycle: Secondary | ICD-10-CM

## 2023-08-28 DIAGNOSIS — R102 Pelvic and perineal pain: Secondary | ICD-10-CM | POA: Insufficient documentation

## 2023-08-28 LAB — POCT URINALYSIS DIP (CLINITEK)
Bilirubin, UA: NEGATIVE
Blood, UA: NEGATIVE
Glucose, UA: NEGATIVE mg/dL
Ketones, POC UA: NEGATIVE mg/dL
Leukocytes, UA: NEGATIVE
Nitrite, UA: NEGATIVE
POC PROTEIN,UA: NEGATIVE
Spec Grav, UA: 1.02 (ref 1.010–1.025)
Urobilinogen, UA: 2 U/dL — AB
pH, UA: 7.5 (ref 5.0–8.0)

## 2023-08-28 LAB — POCT URINE PREGNANCY: Preg Test, Ur: NEGATIVE

## 2023-08-28 MED ORDER — NORELGESTROMIN-ETH ESTRADIOL 150-35 MCG/24HR TD PTWK
1.0000 | MEDICATED_PATCH | TRANSDERMAL | 1 refills | Status: DC
Start: 2023-08-28 — End: 2023-10-24

## 2023-08-28 NOTE — Progress Notes (Signed)
Patient states she feels ike she has BV and yeast infection again.  She's trying to get put on birth control.  Declined Flu vacc

## 2023-08-28 NOTE — Progress Notes (Signed)
Patient ID: Ebony Snyder, female    DOB: 11-Nov-1995  MRN: [id number]  CC: Birth Control  Subjective: Ebony Snyder is a 27 y.o. female who presents for birth control.   Her concerns today include:  Reports established with [facility] and would like new referral due to states unpleasant patient experience. States she was established with [facility] for pelvic cramping. States currently has vaginal odor and vaginal discomfort. Reports frequent history of bacterial vaginitis. Reports she would like to begin birth control patch. States she took a Plan B pill earlier this month.  Patient Active Problem List   Diagnosis Date Noted   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   Acute upper respiratory infection 02/12/2023   Lump in lower inner quadrant of right breast 01/15/2022   Anemia, postpartum 04/05/2021   History of cesarean delivery 02/21/2021   History of gestational hypertension 02/20/2021   LGA (large for gestational age) fetus affecting management of mother 01/07/2021   History of gestational diabetes 12/16/2020   Genetic carrier 11/16/2020     Current Outpatient Medications on File Prior to Visit  Medication Sig Dispense Refill   [DISCONTINUED] albuterol (PROVENTIL HFA;VENTOLIN HFA) 108 (90 Base) MCG/ACT inhaler Inhale 2 puffs into the lungs every 4 (four) hours as needed for wheezing or shortness of breath (cough, shortness of breath or wheezing.). (Patient not taking: Reported on 06/02/2018) 1 Inhaler 5   Current Facility-Administered Medications on File Prior to Visit  Medication Dose Route Frequency Provider Last Rate Last Admin   cefTRIAXone (ROCEPHIN) injection 250 mg  250 mg Intramuscular Once Georganna Skeans, MD        No Known Allergies  Social History   Socioeconomic History   Marital status: Single    Spouse name: Not on file   Number of children: Not on file   Years of education: Not on file   Highest education level: Not on file   Occupational History   Not on file  Tobacco Use   Smoking status: Never   Smokeless tobacco: Never  Vaping Use   Vaping status: Never Used  Substance and Sexual Activity   Alcohol use: Not Currently    Comment: occ   Drug use: No   Sexual activity: Yes    Birth control/protection: None    Comment: condoms 50% of the time  Other Topics Concern   Not on file  Social History Narrative   Not on file   Social Determinants of Health   Financial Resource Strain: Not on file  Food Insecurity: Not on file  Transportation Needs: Not on file  Physical Activity: Not on file  Stress: Not on file  Social Connections: Not on file  Intimate Partner Violence: Not on file    Family History  Problem Relation Age of Onset   Hypertension Mother    Hypertension Father    Asthma Sister    Diabetes Maternal Grandmother    Early death Maternal Grandmother     Past Surgical History:  Procedure Laterality Date   CESAREAN SECTION N/A 02/21/2021   Procedure: CESAREAN SECTION;  Surgeon: Martelle Bing, MD;  Location: [facility];  Service: Obstetrics;  Laterality: N/A;  Arrest of Dilation   MOUTH SURGERY     NO PAST SURGERIES     WISDOM TOOTH EXTRACTION      ROS: Review of Systems Negative except as stated above  PHYSICAL EXAM: BP 122/84   Pulse 72   Temp 98.8 F (37.1 C) (Oral)  Ht 5\' 2"  (1.575 m)   Wt 189 lb 6.4 oz (85.9 kg)   LMP 08/16/2023 (Exact Date)   SpO2 97%   BMI 34.64 kg/m   Physical Exam HENT:     Head: Normocephalic and atraumatic.     Nose: Nose normal.     Mouth/Throat:     Mouth: Mucous membranes are moist.     Pharynx: Oropharynx is clear.  Eyes:     Extraocular Movements: Extraocular movements intact.     Conjunctiva/sclera: Conjunctivae normal.     Pupils: Pupils are equal, round, and reactive to light.  Cardiovascular:     Rate and Rhythm: Normal rate and regular rhythm.     Pulses: Normal pulses.     Heart sounds: Normal heart sounds.   Pulmonary:     Effort: Pulmonary effort is normal.     Breath sounds: Normal breath sounds.  Musculoskeletal:        General: Normal range of motion.     Cervical back: Normal range of motion and neck supple.  Neurological:     General: No focal deficit present.     Mental Status: She is alert and oriented to person, place, and time.  Psychiatric:        Mood and Affect: Mood normal.        Behavior: Behavior normal.     ASSESSMENT AND PLAN: 1. Vaginal odor 2. Vaginal discomfort 3. Pelvic cramping - Routine screening.  - Referral to Gynecology for evaluation/management.  - POCT URINALYSIS DIP (CLINITEK); Future - Cervicovaginal ancillary only - Ambulatory referral to Gynecology  4. Encounter for initial prescription of transdermal patch hormonal contraceptive device - Routine screening.  - Will prescribed transdermal patch once results return. - Referral to Gynecology for evaluation/management. During the interim follow-up with primary provider as scheduled until established with referral. - POCT urine pregnancy; Future - Ambulatory referral to Gynecology  5. Encounter for screening for HIV - Routine screening.  - HIV antibody (with reflex)    Patient was given the opportunity to ask questions.  Patient verbalized understanding of the plan and was able to repeat key elements of the plan. Patient was given clear instructions to go to Emergency Department or return to medical center if symptoms don't improve, worsen, or new problems develop.The patient verbalized understanding.   Orders Placed This Encounter  Procedures   HIV antibody (with reflex)   Ambulatory referral to Gynecology   POCT urine pregnancy   POCT URINALYSIS DIP (CLINITEK)     Return in about 4 weeks (around 09/25/2023) for Follow-Up or next available Georganna Skeans, MD .  Rema Fendt, NP

## 2023-08-29 LAB — CERVICOVAGINAL ANCILLARY ONLY
Bacterial Vaginitis (gardnerella): POSITIVE — AB
Candida Glabrata: NEGATIVE
Candida Vaginitis: NEGATIVE
Chlamydia: NEGATIVE
Comment: NEGATIVE
Comment: NEGATIVE
Comment: NEGATIVE
Comment: NEGATIVE
Comment: NEGATIVE
Comment: NORMAL
Neisseria Gonorrhea: NEGATIVE
Trichomonas: NEGATIVE

## 2023-09-02 ENCOUNTER — Encounter: Payer: Self-pay | Admitting: Family Medicine

## 2023-09-02 ENCOUNTER — Other Ambulatory Visit: Payer: Self-pay | Admitting: Family Medicine

## 2023-09-02 MED ORDER — METRONIDAZOLE 500 MG PO TABS: 500.0000 mg | ORAL_TABLET | Freq: Two times a day (BID) | ORAL | 0 refills | Status: AC

## 2023-09-13 ENCOUNTER — Other Ambulatory Visit: Payer: Self-pay | Admitting: Family Medicine

## 2023-09-13 MED ORDER — METRONIDAZOLE 1.3 % VA GEL: 1.0000 | Freq: Every evening | VAGINAL | 0 refills | Status: DC

## 2023-10-24 ENCOUNTER — Other Ambulatory Visit: Payer: Self-pay | Admitting: *Deleted

## 2023-10-24 ENCOUNTER — Encounter: Payer: Self-pay | Admitting: Radiology

## 2023-10-24 ENCOUNTER — Ambulatory Visit (INDEPENDENT_AMBULATORY_CARE_PROVIDER_SITE_OTHER): Payer: Medicaid Other | Admitting: Radiology

## 2023-10-24 VITALS — BP 108/72 | HR 72

## 2023-10-24 DIAGNOSIS — R102 Pelvic and perineal pain: Secondary | ICD-10-CM | POA: Diagnosis not present

## 2023-10-24 DIAGNOSIS — N760 Acute vaginitis: Secondary | ICD-10-CM

## 2023-10-24 DIAGNOSIS — Z113 Encounter for screening for infections with a predominantly sexual mode of transmission: Secondary | ICD-10-CM | POA: Diagnosis not present

## 2023-10-24 DIAGNOSIS — B9689 Other specified bacterial agents as the cause of diseases classified elsewhere: Secondary | ICD-10-CM

## 2023-10-24 LAB — URINALYSIS, COMPLETE W/RFL CULTURE
Bacteria, UA: NONE SEEN /[HPF]
Bilirubin Urine: NEGATIVE
Glucose, UA: NEGATIVE
Hgb urine dipstick: NEGATIVE
Hyaline Cast: NONE SEEN /[LPF]
Ketones, ur: NEGATIVE
Leukocyte Esterase: NEGATIVE
Nitrites, Initial: NEGATIVE
Protein, ur: NEGATIVE
RBC / HPF: NONE SEEN /[HPF] (ref 0–2)
Specific Gravity, Urine: 1.02 (ref 1.001–1.035)
WBC, UA: NONE SEEN /[HPF] (ref 0–5)
pH: 8.5 — ABNORMAL HIGH (ref 5.0–8.0)

## 2023-10-24 LAB — WET PREP FOR TRICH, YEAST, CLUE

## 2023-10-24 LAB — NO CULTURE INDICATED

## 2023-10-24 LAB — PREGNANCY, URINE: Preg Test, Ur: NEGATIVE

## 2023-10-24 MED ORDER — FLUCONAZOLE 150 MG PO TABS
150.0000 mg | ORAL_TABLET | ORAL | 0 refills | Status: DC
Start: 2023-10-24 — End: 2024-05-25

## 2023-10-24 MED ORDER — NUVESSA 1.3 % VA GEL
1.0000 | Freq: Once | VAGINAL | 0 refills | Status: AC
Start: 2023-10-24 — End: 2023-10-24

## 2023-10-24 NOTE — Progress Notes (Signed)
      Subjective: Ebony Snyder is a 27 y.o. female who complains of recurrent BV, has been treated with flagyl and clinadmycin po and vaginal with no relief. C/o vaginal odor and cramping     Review of Systems  All other systems reviewed and are negative.   Past Medical History:  Diagnosis Date   Anxiety    Concussion    Depression    denies   Diabetes mellitus without complication (HCC)    Dysmenorrhea    Migraine without aura    STD (sexually transmitted disease)       Objective:  Today's Vitals   10/24/23 0935  BP: 108/72  Pulse: 72  SpO2: 96%   There is no height or weight on file to calculate BMI.   Physical Exam Vitals and nursing note reviewed. Exam conducted with a chaperone present.  Constitutional:      Appearance: Normal appearance. She is well-developed.  Pulmonary:     Effort: Pulmonary effort is normal.  Abdominal:     General: Abdomen is flat.     Palpations: Abdomen is soft.  Genitourinary:    General: Normal vulva.     Vagina: Vaginal discharge present. No erythema, bleeding or lesions.     Cervix: Normal. No discharge, friability, lesion or erythema.     Uterus: Normal.      Adnexa: Right adnexa normal and left adnexa normal.  Neurological:     Mental Status: She is alert.  Psychiatric:        Mood and Affect: Mood normal.        Thought Content: Thought content normal.        Judgment: Judgment normal.      Microscopic wet-mount exam shows clue cells.   Raynelle Fanning, CMA present for exam  Assessment:/Plan:   1. Pelvic pain (Primary) - Urinalysis,Complete w/RFL Culture - Pregnancy, urine  2. BV (bacterial vaginosis) Requests fluconazole with BV treatment - NUVESSA 1.3 % GEL; Place 1 Dose vaginally once for 1 dose.  Dispense: 5 g; Refill: 0 - fluconazole (DIFLUCAN) 150 MG tablet; Take 1 tablet (150 mg total) by mouth every 3 (three) days.  Dispense: 2 tablet; Refill: 0 - Mycoplasma / ureaplasma culture  3. Screening for  STDs (sexually transmitted diseases) - SURESWAB CT/NG/T. vaginalis    Will contact patient with results of testing completed today. Avoid intercourse until symptoms are resolved. Safe sex encouraged. Avoid the use of soaps or perfumed products in the peri area. Avoid tub baths and sitting in sweaty or wet clothing for prolonged periods of time.   No follow-ups on file.   Kama Cammarano B, NP 10:13 AM

## 2023-10-24 NOTE — Addendum Note (Signed)
Addended by: Arlie Solomons on: 10/24/2023 11:13 AM   Modules accepted: Orders

## 2023-10-25 LAB — SURESWAB CT/NG/T. VAGINALIS
C. trachomatis RNA, TMA: NOT DETECTED
N. gonorrhoeae RNA, TMA: NOT DETECTED
Trichomonas vaginalis RNA: NOT DETECTED

## 2023-10-31 LAB — MYCOPLASMA / UREAPLASMA CULTURE

## 2023-11-05 ENCOUNTER — Other Ambulatory Visit: Payer: Self-pay

## 2023-11-05 DIAGNOSIS — A493 Mycoplasma infection, unspecified site: Secondary | ICD-10-CM

## 2023-11-05 DIAGNOSIS — Z2239 Carrier of other specified bacterial diseases: Secondary | ICD-10-CM

## 2023-11-05 MED ORDER — DOXYCYCLINE HYCLATE 100 MG PO CAPS
100.0000 mg | ORAL_CAPSULE | Freq: Two times a day (BID) | ORAL | 0 refills | Status: DC
Start: 2023-11-05 — End: 2023-11-12

## 2023-11-10 ENCOUNTER — Emergency Department (HOSPITAL_COMMUNITY)
Admission: EM | Admit: 2023-11-10 | Discharge: 2023-11-11 | Discharge status: Against Medical Advice | Payer: Medicaid Other | Attending: Emergency Medicine | Admitting: Emergency Medicine

## 2023-11-10 ENCOUNTER — Emergency Department (HOSPITAL_COMMUNITY): Payer: Medicaid Other

## 2023-11-10 ENCOUNTER — Encounter: Payer: Self-pay | Admitting: Obstetrics and Gynecology

## 2023-11-10 ENCOUNTER — Encounter (HOSPITAL_COMMUNITY): Payer: Self-pay | Admitting: Emergency Medicine

## 2023-11-10 ENCOUNTER — Ambulatory Visit (HOSPITAL_COMMUNITY)
Admission: EM | Admit: 2023-11-10 | Discharge: 2023-11-10 | Disposition: A | Discharge status: Other Acute Inpatient Hospital | Payer: Medicaid Other | Attending: Internal Medicine | Admitting: Internal Medicine

## 2023-11-10 DIAGNOSIS — R0689 Other abnormalities of breathing: Secondary | ICD-10-CM | POA: Diagnosis not present

## 2023-11-10 DIAGNOSIS — Z20822 Contact with and (suspected) exposure to covid-19: Secondary | ICD-10-CM | POA: Diagnosis not present

## 2023-11-10 DIAGNOSIS — R109 Unspecified abdominal pain: Secondary | ICD-10-CM | POA: Insufficient documentation

## 2023-11-10 DIAGNOSIS — R5381 Other malaise: Secondary | ICD-10-CM | POA: Diagnosis not present

## 2023-11-10 DIAGNOSIS — R112 Nausea with vomiting, unspecified: Secondary | ICD-10-CM | POA: Insufficient documentation

## 2023-11-10 DIAGNOSIS — T7840XA Allergy, unspecified, initial encounter: Secondary | ICD-10-CM

## 2023-11-10 DIAGNOSIS — Z5321 Procedure and treatment not carried out due to patient leaving prior to being seen by health care provider: Secondary | ICD-10-CM | POA: Insufficient documentation

## 2023-11-10 DIAGNOSIS — B9789 Other viral agents as the cause of diseases classified elsewhere: Secondary | ICD-10-CM | POA: Insufficient documentation

## 2023-11-10 DIAGNOSIS — R0602 Shortness of breath: Secondary | ICD-10-CM | POA: Diagnosis present

## 2023-11-10 DIAGNOSIS — M791 Myalgia, unspecified site: Secondary | ICD-10-CM | POA: Diagnosis not present

## 2023-11-10 DIAGNOSIS — J392 Other diseases of pharynx: Secondary | ICD-10-CM

## 2023-11-10 DIAGNOSIS — J069 Acute upper respiratory infection, unspecified: Secondary | ICD-10-CM | POA: Diagnosis not present

## 2023-11-10 LAB — URINALYSIS, ROUTINE W REFLEX MICROSCOPIC
Bilirubin Urine: NEGATIVE
Glucose, UA: NEGATIVE mg/dL
Hgb urine dipstick: NEGATIVE
Ketones, ur: 20 mg/dL — AB
Leukocytes,Ua: NEGATIVE
Nitrite: NEGATIVE
Protein, ur: NEGATIVE mg/dL
Specific Gravity, Urine: 1.006 (ref 1.005–1.030)
pH: 7 (ref 5.0–8.0)

## 2023-11-10 LAB — COMPREHENSIVE METABOLIC PANEL
ALT: 25 U/L (ref 0–44)
AST: 19 U/L (ref 15–41)
Albumin: 4.1 g/dL (ref 3.5–5.0)
Alkaline Phosphatase: 55 U/L (ref 38–126)
Anion gap: 10 (ref 5–15)
BUN: 7 mg/dL (ref 6–20)
CO2: 23 mmol/L (ref 22–32)
Calcium: 9.9 mg/dL (ref 8.9–10.3)
Chloride: 104 mmol/L (ref 98–111)
Creatinine, Ser: 0.89 mg/dL (ref 0.44–1.00)
GFR, Estimated: >60 mL/min (ref >60)
Glucose, Bld: 146 mg/dL — ABNORMAL HIGH (ref 70–99)
Potassium: 3.7 mmol/L (ref 3.5–5.1)
Sodium: 137 mmol/L (ref 135–145)
Total Bilirubin: 0.7 mg/dL (ref 0.0–1.2)
Total Protein: 7.7 g/dL (ref 6.5–8.1)

## 2023-11-10 LAB — CBC
HCT: 41.8 % (ref 36.0–46.0)
Hemoglobin: 13.8 g/dL (ref 12.0–15.0)
MCH: 29.2 pg (ref 26.0–34.0)
MCHC: 33 g/dL (ref 30.0–36.0)
MCV: 88.4 fL (ref 80.0–100.0)
Platelets: 355 10*3/uL (ref 150–400)
RBC: 4.73 MIL/uL (ref 3.87–5.11)
RDW: 12.8 % (ref 11.5–15.5)
WBC: 10 10*3/uL (ref 4.0–10.5)
nRBC: 0 % (ref 0.0–0.2)

## 2023-11-10 LAB — LIPASE, BLOOD: Lipase: 27 U/L (ref 11–51)

## 2023-11-10 LAB — HCG, SERUM, QUALITATIVE: Preg, Serum: NEGATIVE

## 2023-11-10 MED ORDER — EPINEPHRINE 0.3 MG/0.3ML IJ SOAJ
0.3000 mg | Freq: Once | INTRAMUSCULAR | Status: AC
  Administered 2023-11-10: 0.3 mg via INTRAMUSCULAR

## 2023-11-10 MED ORDER — ALBUTEROL SULFATE HFA 108 (90 BASE) MCG/ACT IN AERS: 2.0000 | INHALATION_SPRAY | RESPIRATORY_TRACT | Status: DC | PRN

## 2023-11-10 MED ORDER — EPINEPHRINE 0.3 MG/0.3ML IJ SOAJ
INTRAMUSCULAR | Status: AC
  Filled 2023-11-10: qty 0.3

## 2023-11-10 NOTE — ED Triage Notes (Signed)
 Pt took last dose of doxycycline last dose Friday. Since then she has not been able to get comfortable, sob, weakness, throat feels tingly, and not able to sleep

## 2023-11-10 NOTE — Progress Notes (Signed)
 Returned call from GYN answering service and confirmed [id number]. Patient reports being prescribed doxycycline  and has since stop taking it due to having nausea, vomiting, dizziness, and difficulty breathing. States that she has to take deep breaths to feel comfortable. Denies facial or throat swelling. Noted that doxycycline  can cause GI side effects but should not cause breathing difficulties and combined with dizziness, recommend presentation to urgent care/ED for in person evaluation. Patient agreed with plan.

## 2023-11-10 NOTE — ED Triage Notes (Signed)
 Pt states that she's been seen frequently for abd cramping secondary to BV. Pt had mycoplasma tested and was started on doxycycline  on Wednesday 1/1. On Friday pt reports starting to feel unwell with N/V, SOB, malaise. Pt presented to UC for these symptoms as well as having her tongue feeling sensitive that started today. Pt states she had globus sensation since Friday. At [facility] she was given epi pen injection for anaphylaxis. Pt states symptoms were not any better post administration. She states that her main complaint is her breathing. Denies trouble breathing currently or SOB, but endorses pressure when taking a deep breath.

## 2023-11-10 NOTE — ED Notes (Signed)
 CALLED CHARGE RN AT [place] ED. LEFT ANOTHER VOICEMAIL.

## 2023-11-10 NOTE — ED Provider Notes (Addendum)
 [facility]    CSN: [id number] Arrival date & time: 11/10/23  1740      History   Chief Complaint Chief Complaint  Patient presents with   Allergic Reaction    possible   Fatigue   restless    HPI Ebony Snyder is a 28 y.o. female.   28 year old female who presents to urgent care with concerns of throat swelling, tongue swelling and difficulty breathing after taking doxycycline .  Her last dose was Friday night and she reports starting to have symptoms then.  The symptoms have continued to worsen.  She reports that she is having significant difficulty with comfort and has a very bad feeling.  She reports that she has a tingling sensation also in her throat and tongue.  She feels she cannot take a deep breath in and she feels that she is having pressure on her chest.  She has never had doxycycline  in the past.   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   Acute upper respiratory infection 02/12/2023   Lump in lower inner quadrant of right breast 01/15/2022   Anemia, postpartum 04/05/2021   History of cesarean delivery 02/21/2021   History of gestational hypertension 02/20/2021   LGA (large for gestational age) fetus affecting management of mother 01/07/2021   History of gestational diabetes 12/16/2020   Genetic carrier 11/16/2020    Past Surgical History:  Procedure Laterality Date   CESAREAN SECTION N/A 02/21/2021   Procedure: CESAREAN SECTION;  Surgeon: Izell Harari, MD;  Location: [facility];  Service: Obstetrics;  Laterality: N/A;  Arrest of Dilation   MOUTH SURGERY     NO PAST SURGERIES     WISDOM TOOTH EXTRACTION      OB History     Gravida  2    Para  1   Term  1   Preterm      AB  1   Living  1      SAB      IAB      Ectopic      Multiple  0   Live Births  1            Home Medications    Prior to Admission medications   Medication Sig Start Date End Date Taking? Authorizing Provider  doxycycline  (VIBRAMYCIN ) 100 MG capsule Take 1 capsule (100 mg total) by mouth 2 (two) times daily for 7 days. 11/05/23 11/12/23  Chrzanowski, Jami B, NP  fluconazole  (DIFLUCAN ) 150 MG tablet Take 1 tablet (150 mg total) by mouth every 3 (three) days. Patient not taking: Reported on 11/10/2023 10/24/23   Chrzanowski, Jami B, NP  albuterol  (PROVENTIL  HFA;VENTOLIN  HFA) 108 (90 Base) MCG/ACT inhaler Inhale 2 puffs into the lungs every 4 (four) hours as needed for wheezing or shortness of breath (cough, shortness of breath or wheezing.). Patient not taking: Reported on 06/02/2018 08/23/16 10/20/19  Mario Million, MD    Family History Family History  Problem Relation Age of Onset   Hypertension Mother    Hypertension Father    Asthma Sister    Diabetes Maternal Grandmother  Early death Maternal Grandmother     Social History Social History   Tobacco Use   Smoking status: Never    Passive exposure: Current   Smokeless tobacco: Never  Vaping Use   Vaping status: Never Used  Substance Use Topics   Alcohol use: Yes    Comment: socially   Drug use: No     Allergies   Patient has no known allergies.   Review of Systems Review of Systems   Physical Exam Triage Vital Signs ED Triage Vitals  Encounter Vitals Group     BP 11/10/23 1745 (!) 148/96     Systolic BP Percentile --      Diastolic BP Percentile --      Pulse Rate 11/10/23 1745 61     Resp 11/10/23 1745 18     Temp 11/10/23 1745 (!) 97.2 F (36.2 C)     Temp Source 11/10/23 1745 Oral     SpO2 11/10/23 1745 97 %     Weight --      Height --      Head Circumference --      Peak Flow --      Pain Score 11/10/23 1749 0     Pain Loc --       Pain Education --      Exclude from Growth Chart --    No data found.  Updated Vital Signs BP (!) 148/96   Pulse 61   Temp (!) 97.2 F (36.2 C) (Oral)   Resp 18   LMP 09/30/2023 (Exact Date)   SpO2 97%   Visual Acuity Right Eye Distance:   Left Eye Distance:   Bilateral Distance:    Right Eye Near:   Left Eye Near:    Bilateral Near:     Physical Exam Vitals and nursing note reviewed.  Constitutional:      General: She is not in acute distress.    Appearance: She is well-developed.  HENT:     Head: Normocephalic and atraumatic.     Mouth/Throat:     Pharynx: Pharyngeal swelling present. No oropharyngeal exudate, posterior oropharyngeal erythema or uvula swelling.     Tonsils: No tonsillar exudate.  Eyes:     Conjunctiva/sclera: Conjunctivae normal.  Cardiovascular:     Rate and Rhythm: Normal rate and regular rhythm.     Heart sounds: No murmur heard. Pulmonary:     Effort: Pulmonary effort is normal. No respiratory distress.     Breath sounds: Normal breath sounds.  Abdominal:     Palpations: Abdomen is soft.     Tenderness: There is no abdominal tenderness.  Musculoskeletal:        General: No swelling.     Cervical back: Neck supple.  Skin:    General: Skin is warm and dry.     Capillary Refill: Capillary refill takes less than 2 seconds.  Neurological:     Mental Status: She is alert.  Psychiatric:        Mood and Affect: Mood normal.      UC Treatments / Results  Labs (all labs ordered are listed, but only abnormal results are displayed) Labs Reviewed - No data to display  EKG   Radiology No results found.  Procedures Procedures (including critical care time)  Medications Ordered in UC Medications  EPINEPHrine  (EPI-PEN) injection 0.3 mg (has no administration in time range)    Initial Impression / Assessment and Plan / UC Course  I have reviewed the triage  vital signs and the nursing notes.  Pertinent labs & imaging results that  were available during my care of the patient were reviewed by me and considered in my medical decision making (see chart for details).     Allergic reaction, initial encounter  Swelling of pharynx  Difficulty breathing   Concern for allergic reaction with possible airway swelling with both subjective and objective findings of some edema in the pharynx.  Although her oxygen levels are normal, my concern is that the patient is having worsening symptoms of throat and oral swelling along with significant difficulty taking breaths in and inability to get comfortable.  Physical exam does appear to show some pharyngeal swelling.  Given this we have given epinephrine  and advised transport to the emergency room where airway monitoring and management is more reasonable.  Discussed this with the patient who is in agreement.   Final Clinical Impressions(s) / UC Diagnoses   Final diagnoses:  Allergic reaction, initial encounter  Swelling of pharynx  Difficulty breathing     Discharge Instructions      Concern for allergic reaction with possible airway swelling with both subjective and objective findings of some edema in the pharynx.  Given this we have given epinephrine  and advised transport to the emergency room where airway monitoring and management is more reasonable.  Discussed this with the patient.    ED Prescriptions   None    PDMP not reviewed this encounter.   Teresa Almarie LABOR, PA-C 11/10/23 1820    Teresa Almarie LABOR, PA-C 11/10/23 1821    Teresa Almarie LABOR, PA-C 11/11/23 1646

## 2023-11-10 NOTE — ED Notes (Signed)
 Patient is being discharged from the Urgent Care and sent to the Emergency Department via [facility] . Per Almarie Pizza PA, patient is in need of higher level of care due to Allergic reaction with airway swelling. Patient is aware and verbalizes understanding of plan of care.  Vitals:   11/10/23 1745  BP: (!) 148/96  Pulse: 61  Resp: 18  Temp: (!) 97.2 F (36.2 C)  SpO2: 97%

## 2023-11-10 NOTE — Discharge Instructions (Addendum)
 Concern for allergic reaction with possible airway swelling with both subjective and objective findings of some edema in the pharynx.  Given this we have given epinephrine  and advised transport to the emergency room where airway monitoring and management is more reasonable.  Discussed this with the patient.

## 2023-11-10 NOTE — ED Notes (Addendum)
 [facility] CALLED AND IN ROUTE.  CALLED [facility] CHARGE RN AND LEFT DETAILED VOICEMAIL THAT PATIENT IS BEING TRANSPORTED FOR AN ALLERGIC REACTION WITH EPI ON BOARD.

## 2023-11-11 ENCOUNTER — Emergency Department (HOSPITAL_BASED_OUTPATIENT_CLINIC_OR_DEPARTMENT_OTHER)
Admission: EM | Admit: 2023-11-11 | Discharge: 2023-11-11 | Disposition: A | Discharge status: Home / Self Care | Payer: Medicaid Other | Source: Home / Self Care | Attending: Emergency Medicine | Admitting: Emergency Medicine

## 2023-11-11 ENCOUNTER — Encounter (HOSPITAL_BASED_OUTPATIENT_CLINIC_OR_DEPARTMENT_OTHER): Payer: Self-pay | Admitting: Emergency Medicine

## 2023-11-11 ENCOUNTER — Ambulatory Visit: Payer: Self-pay | Admitting: *Deleted

## 2023-11-11 ENCOUNTER — Other Ambulatory Visit (HOSPITAL_BASED_OUTPATIENT_CLINIC_OR_DEPARTMENT_OTHER): Payer: Self-pay

## 2023-11-11 ENCOUNTER — Emergency Department (HOSPITAL_BASED_OUTPATIENT_CLINIC_OR_DEPARTMENT_OTHER): Payer: Medicaid Other | Admitting: Radiology

## 2023-11-11 DIAGNOSIS — J069 Acute upper respiratory infection, unspecified: Secondary | ICD-10-CM | POA: Insufficient documentation

## 2023-11-11 DIAGNOSIS — M791 Myalgia, unspecified site: Secondary | ICD-10-CM | POA: Insufficient documentation

## 2023-11-11 DIAGNOSIS — E119 Type 2 diabetes mellitus without complications: Secondary | ICD-10-CM

## 2023-11-11 DIAGNOSIS — R112 Nausea with vomiting, unspecified: Secondary | ICD-10-CM | POA: Insufficient documentation

## 2023-11-11 DIAGNOSIS — Z20822 Contact with and (suspected) exposure to covid-19: Secondary | ICD-10-CM | POA: Insufficient documentation

## 2023-11-11 LAB — COMPREHENSIVE METABOLIC PANEL
ALT: 29 U/L (ref 0–44)
AST: 21 U/L (ref 15–41)
Albumin: 4.8 g/dL (ref 3.5–5.0)
Alkaline Phosphatase: 52 U/L (ref 38–126)
Anion gap: 10 (ref 5–15)
BUN: 9 mg/dL (ref 6–20)
CO2: 26 mmol/L (ref 22–32)
Calcium: 9.8 mg/dL (ref 8.9–10.3)
Chloride: 103 mmol/L (ref 98–111)
Creatinine, Ser: 0.7 mg/dL (ref 0.44–1.00)
GFR, Estimated: >60 mL/min (ref >60)
Glucose, Bld: 116 mg/dL — ABNORMAL HIGH (ref 70–99)
Potassium: 3.6 mmol/L (ref 3.5–5.1)
Sodium: 139 mmol/L (ref 135–145)
Total Bilirubin: 0.6 mg/dL (ref 0.0–1.2)
Total Protein: 8.1 g/dL (ref 6.5–8.1)

## 2023-11-11 LAB — CBC
HCT: 40.2 % (ref 36.0–46.0)
Hemoglobin: 13.6 g/dL (ref 12.0–15.0)
MCH: 29.5 pg (ref 26.0–34.0)
MCHC: 33.8 g/dL (ref 30.0–36.0)
MCV: 87.2 fL (ref 80.0–100.0)
Platelets: 349 10*3/uL (ref 150–400)
RBC: 4.61 MIL/uL (ref 3.87–5.11)
RDW: 12.8 % (ref 11.5–15.5)
WBC: 9.5 10*3/uL (ref 4.0–10.5)
nRBC: 0 % (ref 0.0–0.2)

## 2023-11-11 LAB — RESP PANEL BY RT-PCR (RSV, FLU A&B, COVID)  RVPGX2
Influenza A by PCR: NEGATIVE
Influenza B by PCR: NEGATIVE
Resp Syncytial Virus by PCR: NEGATIVE
SARS Coronavirus 2 by RT PCR: NEGATIVE

## 2023-11-11 LAB — URINALYSIS, ROUTINE W REFLEX MICROSCOPIC
Bilirubin Urine: NEGATIVE
Glucose, UA: NEGATIVE mg/dL
Hgb urine dipstick: NEGATIVE
Ketones, ur: NEGATIVE mg/dL
Leukocytes,Ua: NEGATIVE
Nitrite: NEGATIVE
Protein, ur: NEGATIVE mg/dL
Specific Gravity, Urine: 1.005 (ref 1.005–1.030)
pH: 7 (ref 5.0–8.0)

## 2023-11-11 LAB — PREGNANCY, URINE: Preg Test, Ur: NEGATIVE

## 2023-11-11 MED ORDER — ONDANSETRON 4 MG PO TBDP
4.0000 mg | ORAL_TABLET | Freq: Three times a day (TID) | ORAL | 0 refills | Status: DC | PRN
Start: 2023-11-11 — End: 2024-05-25
  Filled 2023-11-11: qty 20, 7d supply, fill #0

## 2023-11-11 MED ORDER — ONDANSETRON 4 MG PO TBDP
4.0000 mg | ORAL_TABLET | Freq: Once | ORAL | Status: AC
Start: 2023-11-11 — End: 2023-11-11
  Administered 2023-11-11: 4 mg via ORAL
  Filled 2023-11-11: qty 1

## 2023-11-11 MED ORDER — PREDNISONE 10 MG PO TABS
ORAL_TABLET | ORAL | 0 refills | Status: AC
  Filled 2023-11-11: qty 19, 7d supply, fill #0

## 2023-11-11 NOTE — Telephone Encounter (Signed)
  Chief Complaint: possible allergic reaction- SOB Symptoms: SOB, rash, vomiting Frequency: Patient states she went to UC and was directed to ED yesterday- she left after Epipen  dose Pertinent Negatives: Patient denies hives worse- but still has SOB with speaking Disposition: [x] ED /[] Urgent Care (no appt availability in office) / [] Appointment(In office/virtual)/ []  [facility]/ [] Home Care/ [] Refused Recommended Disposition /[] [facility]/ []  Follow-up with PCP Additional Notes: Patient advised return to ED- due to continued SOB.

## 2023-11-11 NOTE — ED Notes (Signed)
 Pt wanted to leave and asked for IV inleft arm to removed. Iv removed and pt seen leaving ED

## 2023-11-11 NOTE — ED Provider Notes (Signed)
 [place] EMERGENCY DEPARTMENT AT [facility] Provider Note   CSN: [id number] Arrival date & time: 11/11/23  1145     History  Chief Complaint  Patient presents with   Shortness of Breath    Ebony Snyder is a 28 y.o. female with diabetes, presents with concern for shortness of breath, body aches, chills, and episodic nausea and vomiting for the past 3 days.  Also reports decreased appetite. She feels like this is related to the doxycycline  she was started on for mycoplasma in her vagina so she stopped taking it about 3 days ago.  Denies any chest pain, abnormal pain.   Shortness of Breath      Home Medications Prior to Admission medications   Medication Sig Start Date End Date Taking? Authorizing Provider  doxycycline  (VIBRAMYCIN ) 100 MG capsule Take 1 capsule (100 mg total) by mouth 2 (two) times daily for 7 days. 11/05/23 11/12/23  Chrzanowski, Jami B, NP  fluconazole  (DIFLUCAN ) 150 MG tablet Take 1 tablet (150 mg total) by mouth every 3 (three) days. Patient not taking: Reported on 11/10/2023 10/24/23   Chrzanowski, Jami B, NP  ondansetron  (ZOFRAN -ODT) 4 MG disintegrating tablet Take 1 tablet (4 mg total) by mouth every 8 (eight) hours as needed for nausea or vomiting. 11/11/23  Yes Veta Palma, PA-C  predniSONE  (DELTASONE ) 10 MG tablet Take 4 tablets (40 mg total) by mouth daily with breakfast for 2 days, THEN 3 tablets (30 mg total) daily with breakfast for 2 days, THEN 2 tablets (20 mg total) daily with breakfast for 2 days, THEN 1 tablet (10 mg total) daily with breakfast for 1 day. 11/11/23 11/18/23 Yes Veta Palma, PA-C  albuterol  (PROVENTIL  HFA;VENTOLIN  HFA) 108 (90 Base) MCG/ACT inhaler Inhale 2 puffs into the lungs every 4 (four) hours as needed for wheezing or shortness of breath (cough, shortness of breath or wheezing.). Patient not taking: Reported on 06/02/2018 08/23/16 10/20/19  Mario Million, MD      Allergies    Patient has no known  allergies.    Review of Systems   Review of Systems  Respiratory:  Positive for shortness of breath.     Physical Exam Updated Vital Signs BP (!) 128/97   Pulse (!) 58   Temp 98.3 F (36.8 C)   Resp 16   Wt 85.3 kg   LMP 10/30/2023 (Exact Date)   SpO2 100%   BMI 34.39 kg/m  Physical Exam Vitals and nursing note reviewed.  Constitutional:      General: She is not in acute distress.    Appearance: She is well-developed.  HENT:     Head: Normocephalic and atraumatic.     Mouth/Throat:     Comments: No edema of the posterior oropharynx, able to tolerate secretions Eyes:     Conjunctiva/sclera: Conjunctivae normal.  Cardiovascular:     Rate and Rhythm: Normal rate and regular rhythm.     Heart sounds: No murmur heard. Pulmonary:     Effort: Pulmonary effort is normal. No respiratory distress.     Breath sounds: Normal breath sounds.     Comments: Breathing comfortably on room air, talking in full sentences Lungs clear to auscultation bilaterally Abdominal:     Palpations: Abdomen is soft.     Tenderness: There is no abdominal tenderness.  Musculoskeletal:        General: No swelling.     Cervical back: Neck supple.  Skin:    General: Skin is warm and dry.  Capillary Refill: Capillary refill takes less than 2 seconds.  Neurological:     Mental Status: She is alert.  Psychiatric:        Mood and Affect: Mood normal.     ED Results / Procedures / Treatments   Labs (all labs ordered are listed, but only abnormal results are displayed) Labs Reviewed  COMPREHENSIVE METABOLIC PANEL - Abnormal; Notable for the following components:      Result Value   Glucose, Bld 116 (*)    All other components within normal limits  RESP PANEL BY RT-PCR (RSV, FLU A&B, COVID)  RVPGX2  CBC  URINALYSIS, ROUTINE W REFLEX MICROSCOPIC  PREGNANCY, URINE    EKG None  Radiology DG Chest 2 View Result Date: 11/10/2023 CLINICAL DATA:  Shortness of breath since yesterday EXAM: CHEST  - 2 VIEW COMPARISON:  None Available. FINDINGS: The heart size and mediastinal contours are within normal limits. Both lungs are clear. The visualized skeletal structures are unremarkable. IMPRESSION: No active cardiopulmonary disease. Electronically Signed   By: Norman Gatlin M.D.   On: 11/10/2023 21:30    Procedures Procedures    Medications Ordered in ED Medications  ondansetron  (ZOFRAN -ODT) disintegrating tablet 4 mg (has no administration in time range)    ED Course/ Medical Decision Making/ A&P                                 Medical Decision Making Amount and/or Complexity of Data Reviewed Labs: ordered.     Differential diagnosis includes but is not limited to COVID, flu, RSV, viral URI, strep pharyngitis, viral pharyngitis, allergic rhinitis, pneumonia, bronchitis   ED Course:  Patient well-appearing, stable vital signs.  She reports shortness of breath, but is stating 100% on room air, talking in full sentences.  Also having chills, body ache, low appetite.  Patient feels like this is related to her doxycycline , but no signs of allergic reaction- no rashes, posterior oropharynx swelling, hypotension, and this has been going on for 3 days without any acute worsening.  I reviewed urgent care note from yesterday.  She was given epinephrine  at urgent care yesterday which she reports did not significantly help symptoms.  She did go to the ER but left prior to being seen.  Flu, COVID, RSV negative.  I reviewed her chest x-ray from yesterday which shows no acute abnormalities.  Do not feel like she needs repeat today as symptoms have not worsened.  I feel like the symptoms are likely due to viral URI. Patient eating yogurt and applesauce in the room.  She request nausea medication and was given Zofran  prior to discharge.  Impression: Viral URI  Disposition:  The patient was discharged home with instructions to the Zofran  as needed at home for her nausea.  Prednisone  taper  prescribed for her shortness of breath and possible lung irritation. OTC medications as needed.  Follow-up with PCP if not improved in the next 5 days.  Instructed her to reach out to the provider who prescribed the doxycycline  to discuss if she needs any different treatment. Return precautions given.           Final Clinical Impression(s) / ED Diagnoses Final diagnoses:  Viral URI    Rx / DC Orders ED Discharge Orders          Ordered    predniSONE  (DELTASONE ) 10 MG tablet  Q breakfast        11/11/23  1703    ondansetron  (ZOFRAN -ODT) 4 MG disintegrating tablet  Every 8 hours PRN        11/11/23 1703              Veta Palma, PA-C 11/11/23 1709    Jerrol Agent, MD 11/11/23 1739

## 2023-11-11 NOTE — Discharge Instructions (Addendum)
 You appear to have an upper respiratory infection (URI). An upper respiratory tract infection, or cold, is a viral infection of the air passages leading to the lungs. It should improve gradually after 5-7 days. You may have a lingering cough that lasts for 2- 4 weeks after the infection.  Your flu, covid, and RSV test were negative today.  Your chest x-ray does not show any signs of pneumonia.  There are no medications, such as antibiotics, that will cure your infection.   You have been prescribed a prednisone  taper to help with inflammation that may be contributing with your shortness of breath.  You have also been prescribed Zofran  to help with your nausea. This medication dissolves under the tongue, you do not have to swallow it. Please use this as prescribed.  Home care instructions:  You can take Tylenol  and/or Ibuprofen  as directed on the packaging for fever reduction and pain relief.    For cough: honey 1/2 to 1 teaspoon (you can dilute the honey in water or another fluid).  You can also use guaifenesin and dextromethorphan for cough which are over-the-counter medications. You can use a humidifier for chest congestion and cough.  If you don't have a humidifier, you can sit in the bathroom with the hot shower running.      For sore throat: try warm salt water gargles, cepacol lozenges, throat spray, warm tea or water with lemon/honey, popsicles or ice, or OTC cold relief medicine for throat discomfort.    For congestion: Flonase  1-2 sprays in each nostril daily.    It is important to stay hydrated: drink plenty of fluids (water, gatorade/powerade/pedialyte, juices, or teas) to keep your throat moisturized and help further relieve irritation/discomfort.   Your illness is contagious and can be spread to others, especially during the first 3 or 4 days. It cannot be cured by antibiotics or other medicines. Take basic precautions such as washing your hands often, covering your mouth when you  cough or sneeze, and avoiding public places where you could spread your illness to others.   Follow-up instructions: Please follow-up with your primary care provider for further evaluation of your symptoms if you are not feeling better within the next 5 days.   Please contact the provider who prescribed your doxycyline to inform them of your symptoms and discuss with them if you need to be on another antibiotic.   Return instructions:  Please return to the Emergency Department if you experience worsening symptoms.  RETURN IMMEDIATELY IF you develop shortness of breath, confusion or altered mental status, a new rash, become dizzy, faint, or poorly responsive, or are unable to be cared for at home. Please return if you have persistent vomiting and cannot keep down fluids or develop a fever that is not controlled by tylenol  or motrin .   Please return if you have any other emergent concerns.

## 2023-11-11 NOTE — Telephone Encounter (Signed)
 Reason for Disposition  [1] MODERATE difficulty breathing (e.g., speaks in phrases, SOB even at rest, pulse 100-120) AND [2] NEW-onset or WORSE than normal  Answer Assessment - Initial Assessment Questions 1. RESPIRATORY STATUS: Describe your breathing? (e.g., wheezing, shortness of breath, unable to speak, severe coughing)      Rx'd Doxycycline - 3rd day- hive and vomiting 2. ONSET: When did this breathing problem begin?      Went to ED last night- was given Epipen  at [facility] 3. PATTERN Does the difficult breathing come and go, or has it been constant since it started?      Constant- worse when speaking 4. SEVERITY: How bad is your breathing? (e.g., mild, moderate, severe)    - MILD: No SOB at rest, mild SOB with walking, speaks normally in sentences, can lie down, no retractions, pulse < 100.    - MODERATE: SOB at rest, SOB with minimal exertion and prefers to sit, cannot lie down flat, speaks in phrases, mild retractions, audible wheezing, pulse 100-120.    - SEVERE: Very SOB at rest, speaks in single words, struggling to breathe, sitting hunched forward, retractions, pulse > 120      moderate 5. RECURRENT SYMPTOM: Have you had difficulty breathing before? If Yes, ask: When was the last time? and What happened that time?      No- possible allergic reaction  8. CAUSE: What do you think is causing the breathing problem?      Possible allergic reaction 9. OTHER SYMPTOMS: Do you have any other symptoms? (e.g., dizziness, runny nose, cough, chest pain, fever)     Chilled, bumps are present on face- not as prevalent  1  Protocols used: Breathing Difficulty-A-AH

## 2023-11-11 NOTE — ED Notes (Signed)
 Discharge instructions, follow up care, and prescriptions reviewed and explained, pt verbalized understanding.

## 2023-11-11 NOTE — ED Triage Notes (Signed)
 Pt c/o chills, shob x x3 days, reports shob worse "when talking". Rec'd epi yesterday at [facility] from [facility] yesterday

## 2023-11-12 ENCOUNTER — Other Ambulatory Visit: Payer: Self-pay | Admitting: Radiology

## 2023-11-12 ENCOUNTER — Ambulatory Visit: Payer: Medicaid Other | Admitting: Radiology

## 2023-11-12 DIAGNOSIS — A493 Mycoplasma infection, unspecified site: Secondary | ICD-10-CM

## 2023-11-12 MED ORDER — MOXIFLOXACIN HCL 400 MG PO TABS: 400.0000 mg | ORAL_TABLET | Freq: Every day | ORAL | 0 refills | Status: DC

## 2023-11-12 MED ORDER — AZITHROMYCIN 500 MG PO TABS: ORAL_TABLET | ORAL | 0 refills | Status: DC

## 2023-11-12 NOTE — Progress Notes (Signed)
 Pt called to report she was seen in ED for reaction to doxycycline that was being used to treat urea/mycoplasma. Pt has stopped antibiotic. Rx sent for azithromycin and moxifloxacin as an alternative. Follow up in 8 weeks.

## 2023-11-29 ENCOUNTER — Telehealth: Payer: Self-pay | Admitting: *Deleted

## 2023-11-29 NOTE — Telephone Encounter (Signed)
Spoke with patient.  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  Tx with doxycycline, changed to Moxifloxacin and azithromycin due to reaction.   Patient completed Moxifloxacin, never took azithromycin, was unsure what to do with the Rx. Symptoms did not resolve. Reports continued pelvic discomfort and cramping in vagina. Denies any other symptoms. She does have azithromycin Rx.   States she is not currently sexually active.   Advised I will review plan of care with Jami since both abx were not taken at the same time and our office will f/u. Patient agreeable.   Jami -please review and advise.

## 2023-11-29 NOTE — Telephone Encounter (Signed)
Call returned to patient. Unable to talk. Advised patient to return call at her convenience.

## 2023-11-29 NOTE — Telephone Encounter (Signed)
Recommend she take the course of azithromycin as directed- instructions should be on the taper. If no improvement return for OV

## 2023-12-02 NOTE — Telephone Encounter (Signed)
Spoke with patient, advised per Jami. Patient verbalizes understanding and is agreeable.   Encounter closed.

## 2023-12-11 NOTE — Telephone Encounter (Signed)
 Recommend retesting to make sure ureaplasma and mycoplasma have resolved with treatment.

## 2023-12-11 NOTE — Telephone Encounter (Signed)
 Pt LVM in triage line stating that she has completed the azithromycin  and does not feel any better, possibly feeling worse.   Spoke w/ the pt and she reported finished abx on Sat 12/07/2023.  Denies relief at all. But over the past few days, feels as if sxs are getting worse. Feels as if she has a trickling d/c and hot and cold sensations in her vagina.   Pt also reports pelvic cramping/discomfort not going away as well along with the vaginal discomfort.   Pt denies any odor or itching/irritation.   Please confirm that pt needs an additional OV.

## 2023-12-19 ENCOUNTER — Ambulatory Visit: Payer: Medicaid Other | Admitting: Nurse Practitioner

## 2023-12-19 VITALS — BP 112/76 | HR 95 | Wt 187.2 lb

## 2023-12-19 DIAGNOSIS — N760 Acute vaginitis: Secondary | ICD-10-CM | POA: Diagnosis not present

## 2023-12-19 DIAGNOSIS — R300 Dysuria: Secondary | ICD-10-CM

## 2023-12-19 DIAGNOSIS — N898 Other specified noninflammatory disorders of vagina: Secondary | ICD-10-CM

## 2023-12-19 DIAGNOSIS — B9689 Other specified bacterial agents as the cause of diseases classified elsewhere: Secondary | ICD-10-CM

## 2023-12-19 LAB — URINALYSIS, COMPLETE W/RFL CULTURE
Bacteria, UA: NONE SEEN /[HPF]
Bilirubin Urine: NEGATIVE
Glucose, UA: NEGATIVE
Hgb urine dipstick: NEGATIVE
Hyaline Cast: NONE SEEN /[LPF]
Ketones, ur: NEGATIVE
Leukocyte Esterase: NEGATIVE
Nitrites, Initial: NEGATIVE
Protein, ur: NEGATIVE
RBC / HPF: NONE SEEN /[HPF] (ref 0–2)
Specific Gravity, Urine: 1.02 (ref 1.001–1.035)
WBC, UA: NONE SEEN /[HPF] (ref 0–5)
pH: 7.5 (ref 5.0–8.0)

## 2023-12-19 LAB — NO CULTURE INDICATED

## 2023-12-19 LAB — WET PREP FOR TRICH, YEAST, CLUE

## 2023-12-19 MED ORDER — NUVESSA 1.3 % VA GEL: 1.0000 | Freq: Once | VAGINAL | 0 refills | Status: AC

## 2023-12-19 NOTE — Progress Notes (Signed)
   Acute Office Visit  Subjective:    Patient ID: Ebony Snyder, female    DOB: 17-May-1996, 28 y.o.   MRN: [id number]   HPI 28 y.o. presents today for vaginal discharge, irritation, burning with urination and pelvic cramping. H/O recurrent BV. Treated for BV 10/23/24, treated for mycoplasma/ureaplasma with azithromycin and Moxifloxacin (had GI reaction to doxy). Finished most recent antibiotic 2/2. Symptoms did not improve. Not currently sexually active.   Patient's last menstrual period was 11/30/2023 (exact date).    Review of Systems  Constitutional: Negative.   Genitourinary:  Positive for dysuria, pelvic pain (Cramping), vaginal discharge and vaginal pain (Irritation/burning). Negative for difficulty urinating, frequency, genital sores, hematuria and urgency.       Objective:    Physical Exam Constitutional:      Appearance: Normal appearance.  Genitourinary:    General: Normal vulva.     Vagina: Vaginal discharge present. No erythema.     Cervix: Normal.     BP 112/76 (BP Location: Left Arm, Patient Position: Sitting, Cuff Size: Large)   Pulse 95   Wt 187 lb 3.2 oz (84.9 kg)   LMP 11/30/2023 (Exact Date)   SpO2 99%   BMI 34.24 kg/m  Wt Readings from Last 3 Encounters:  12/19/23 187 lb 3.2 oz (84.9 kg)  11/11/23 188 lb (85.3 kg)  08/28/23 189 lb 6.4 oz (85.9 kg)        Patient informed chaperone available to be present for breast and/or pelvic exam. Patient has requested no chaperone to be present. Patient has been advised what will be completed during breast and pelvic exam.   Wet prep + clue cells (+ odor)  UA negative  Assessment & Plan:   Problem List Items Addressed This Visit   None Visit Diagnoses       Bacterial vaginosis    -  Primary   Relevant Medications   metroNIDAZOLE (NUVESSA) 1.3 % GEL     Dysuria       Relevant Orders   Urinalysis,Complete w/RFL Culture     Vaginal irritation       Relevant Orders   Mycoplasma / ureaplasma  culture   WET PREP FOR TRICH, YEAST, CLUE      Plan: Wet prep positive for clue cells - Nuvessa gel once. Recommend boric acid twice weekly after treating current infection. Start women's probiotic. Myco/ureaplasma pending.   Return if symptoms worsen or fail to improve.    Olivia Mackie DNP, 11:44 AM 12/19/2023

## 2023-12-26 LAB — MYCOPLASMA / UREAPLASMA CULTURE

## 2023-12-30 ENCOUNTER — Encounter: Payer: Self-pay | Admitting: Nurse Practitioner

## 2024-01-07 ENCOUNTER — Ambulatory Visit: Payer: Medicaid Other | Admitting: Radiology

## 2024-02-26 ENCOUNTER — Ambulatory Visit: Admitting: Radiology

## 2024-03-12 ENCOUNTER — Encounter: Payer: Self-pay | Admitting: Radiology

## 2024-03-12 ENCOUNTER — Ambulatory Visit (INDEPENDENT_AMBULATORY_CARE_PROVIDER_SITE_OTHER): Admitting: Radiology

## 2024-03-12 VITALS — BP 112/76 | HR 74 | Temp 98.7°F | Wt 194.6 lb

## 2024-03-12 DIAGNOSIS — R102 Pelvic and perineal pain: Secondary | ICD-10-CM

## 2024-03-12 NOTE — Progress Notes (Signed)
      Subjective: Ebony Snyder is a 28 y.o. female who complains of chronic pelvic and groin pain, extending into the legs and goes all the way down to her toes. Experiences every day. Not worse with period, is worse after sex. No urinary symptoms, bowel changes or vaginal discharge. Reports she has not been sexually active in 7 months because of the pain. Hx of recurrent BV, no symptoms now. Pain is worse when sitting or driving. Denies any known injury to her back or hips.   Review of Systems  All other systems reviewed and are negative.   Past Medical History:  Diagnosis Date   Anxiety    Concussion    Depression    denies   Diabetes mellitus without complication (HCC)    Dysmenorrhea    Migraine without aura    STD (sexually transmitted disease)       Objective:  Today's Vitals   03/12/24 0924  BP: 112/76  Pulse: 74  Temp: 98.7 F (37.1 C)  TempSrc: Oral  SpO2: 96%  Weight: 194 lb 9.6 oz (88.3 kg)   Body mass index is 35.59 kg/m.   Physical Exam Vitals and nursing note reviewed. Exam conducted with a chaperone present.  Constitutional:      Appearance: Normal appearance. She is well-developed.  Pulmonary:     Effort: Pulmonary effort is normal.  Abdominal:     General: Abdomen is flat.     Palpations: Abdomen is soft.  Genitourinary:    General: Normal vulva.     Vagina: No vaginal discharge, erythema, bleeding or lesions.     Cervix: Normal. No discharge, friability, lesion or erythema.     Uterus: Normal.      Adnexa: Right adnexa normal and left adnexa normal.  Neurological:     Mental Status: She is alert.  Psychiatric:        Mood and Affect: Mood normal.        Thought Content: Thought content normal.        Judgment: Judgment normal.    Urine dipstick shows negative for all components.  Micro exam: few+ bacteria.   Ellis Guys, CMA present for exam  Assessment:/Plan:   1. Pelvic pain (Primary) May be related to low back or hips. Will  r/o gyn cause. - Pregnancy, urine; neg - Urinalysis,Complete w/RFL Culture - US  Transvaginal Non-OB; Future     Return for u/s then TW.   Terrill Alperin B, NP 9:41 AM

## 2024-03-14 LAB — URINALYSIS, COMPLETE W/RFL CULTURE
Bilirubin Urine: NEGATIVE
Glucose, UA: NEGATIVE
Hgb urine dipstick: NEGATIVE
Hyaline Cast: NONE SEEN /LPF
Ketones, ur: NEGATIVE
Leukocyte Esterase: NEGATIVE
Nitrites, Initial: NEGATIVE
Protein, ur: NEGATIVE
RBC / HPF: NONE SEEN /HPF (ref 0–2)
Specific Gravity, Urine: 1.015 (ref 1.001–1.035)
pH: 7.5 (ref 5.0–8.0)

## 2024-03-14 LAB — URINE CULTURE
MICRO NUMBER:: 16430500
Result:: NO GROWTH
SPECIMEN QUALITY:: ADEQUATE

## 2024-03-14 LAB — CULTURE INDICATED

## 2024-03-14 LAB — PREGNANCY, URINE: Preg Test, Ur: NEGATIVE

## 2024-03-16 ENCOUNTER — Telehealth: Payer: Self-pay | Admitting: Family Medicine

## 2024-03-16 NOTE — Telephone Encounter (Signed)
 Copied from CRM [id number]. Topic: Clinical - Medication Refill >> Mar 16, 2024 10:56 AM Ebony Snyder T wrote: Medication: valACYclovir  (VALTREX ) 1000 MG tablet  Has the patient contacted their pharmacy? No  This is the patient's preferred pharmacy:  [id number] - [place], [place] - [address] AT [facility] [address]  [place] [address] Phone: [phone] Fax: [phone]   Is this the correct pharmacy for this prescription? Yes  Has the prescription been filled recently? No  Is the patient out of the medication? Yes  Has the patient been seen for an appointment in the last year OR does the patient have an upcoming appointment? Yes  Can we respond through MyChart? Yes  Agent: Please be advised that Rx refills may take up to 3 business days. We ask that you follow-up with your pharmacy.

## 2024-03-26 ENCOUNTER — Other Ambulatory Visit: Payer: Self-pay | Admitting: Nurse Practitioner

## 2024-03-26 DIAGNOSIS — R102 Pelvic and perineal pain: Secondary | ICD-10-CM

## 2024-04-02 ENCOUNTER — Ambulatory Visit (INDEPENDENT_AMBULATORY_CARE_PROVIDER_SITE_OTHER): Admitting: Nurse Practitioner

## 2024-04-02 ENCOUNTER — Encounter: Payer: Self-pay | Admitting: Nurse Practitioner

## 2024-04-02 ENCOUNTER — Ambulatory Visit (INDEPENDENT_AMBULATORY_CARE_PROVIDER_SITE_OTHER)

## 2024-04-02 VITALS — BP 126/84 | HR 68

## 2024-04-02 DIAGNOSIS — M79661 Pain in right lower leg: Secondary | ICD-10-CM | POA: Diagnosis not present

## 2024-04-02 DIAGNOSIS — R102 Pelvic and perineal pain: Secondary | ICD-10-CM | POA: Diagnosis not present

## 2024-04-02 NOTE — Progress Notes (Signed)
   Acute Office Visit  Subjective:    Patient ID: Ebony Snyder, female    DOB: [birth year], 28 y.o.   MRN: [id number]   HPI 28 y.o. presents today for ultrasound. Seen by Hildy Lowers, NP 03/12/24 for "complaints of chronic pelvic and groin pain, extending into the legs and goes all the way down to her toes. Experiences every day. Not worse with period, is worse after sex and with sitting. No urinary symptoms, bowel changes or vaginal discharge. Reports she has not been sexually active in 7 months because of the pain. Hx of recurrent BV, no symptoms now. Pain is worse when sitting or driving. Denies any known injury to her back or hips." Neg UPT, UA.  Patient's last menstrual period was 03/22/2024 (exact date). Period Cycle (Days): 28 Period Duration (Days): 5 Period Pattern: Regular Menstrual Flow: Heavy Menstrual Control: Maxi pad, Tampon Dysmenorrhea: (!) Severe Dysmenorrhea Symptoms: Cramping, Headache  Review of Systems  Constitutional: Negative.   Genitourinary:  Positive for pelvic pain. Negative for menstrual problem.       Objective:     Physical Exam Constitutional:      Appearance: Normal appearance.   GU: Not indicated  BP 126/84 (BP Location: Left Arm, Patient Position: Sitting)   Pulse 68   LMP 03/22/2024 (Exact Date)   SpO2 94%  Wt Readings from Last 3 Encounters:  03/12/24 194 lb 9.6 oz (88.3 kg)  12/19/23 187 lb 3.2 oz (84.9 kg)  11/11/23 188 lb (85.3 kg)         Assessment & Plan:   Problem List Items Addressed This Visit   None Visit Diagnoses       Pelvic pain    -  Primary     Pain in right lower leg          Vaginal ultrasound (comparison is made to 2023 ultrasound):  Anteverted uterus, normal size and shape, no myometrial masses.  Symmetrical endometrium, no masses or thickening seen.  Both ovaries mobile, normal size with normal follicle pattern and normal perfusion.  No adnexal masses, no free fluid.  Plan: Reviewed normal ultrasound. Does  not appear to be GYN related. Will follow up with PCP. Likely musculoskeletal.   Return if symptoms worsen or fail to improve.    Andee Bamberger DNP, 9:49 AM 04/02/2024

## 2024-04-09 ENCOUNTER — Encounter: Payer: Self-pay | Admitting: Physician Assistant

## 2024-04-09 ENCOUNTER — Ambulatory Visit (INDEPENDENT_AMBULATORY_CARE_PROVIDER_SITE_OTHER): Admitting: Physician Assistant

## 2024-04-09 ENCOUNTER — Ambulatory Visit: Admitting: Physician Assistant

## 2024-04-09 VITALS — BP 133/87 | HR 72 | Wt 196.6 lb

## 2024-04-09 DIAGNOSIS — R102 Pelvic and perineal pain: Secondary | ICD-10-CM

## 2024-04-09 DIAGNOSIS — E559 Vitamin D deficiency, unspecified: Secondary | ICD-10-CM

## 2024-04-09 DIAGNOSIS — H00014 Hordeolum externum left upper eyelid: Secondary | ICD-10-CM

## 2024-04-09 DIAGNOSIS — H1012 Acute atopic conjunctivitis, left eye: Secondary | ICD-10-CM

## 2024-04-09 MED ORDER — NAPROXEN 500 MG PO TABS: 500.0000 mg | ORAL_TABLET | Freq: Two times a day (BID) | ORAL | 3 refills | Status: DC

## 2024-04-09 MED ORDER — BACITRACIN-POLYMYXIN B 500-10000 UNIT/GM OP OINT
1.0000 | TOPICAL_OINTMENT | Freq: Two times a day (BID) | OPHTHALMIC | 0 refills | Status: DC
Start: 2024-04-09 — End: 2024-05-25

## 2024-04-09 MED ORDER — OLOPATADINE HCL 0.1 % OP SOLN: 1.0000 [drp] | Freq: Two times a day (BID) | OPHTHALMIC | 12 refills | Status: DC

## 2024-04-09 MED ORDER — VALACYCLOVIR HCL 1 G PO TABS: 1000.0000 mg | ORAL_TABLET | Freq: Two times a day (BID) | ORAL | 3 refills | Status: DC

## 2024-04-09 NOTE — Progress Notes (Signed)
 Patient ID: Ebony Snyder, female   DOB: Oct 25, 1996, 28 y.o.   MRN: [id number]   Ebony Snyder, is a 28 y.o. female  WUJ:[id number]  [id number]  DOB - February 04, 1996  Chief Complaint  Patient presents with   Medical Management of Chronic Issues    Vit d levels check        Subjective:   Ebony Snyder is a 28 y.o. female here today for a follow up visit after gyn problem R/o by gyn/pelvic ultrasound.  She says she ahs been having this pain now for about 3 years.  It feels like menstrual cramps combined with something pinching/needles in her cervix.  She has not been SA in more than a year bc of it.  She has had STD screenings a couple of times that were negative since stopping SA.  Earlier in the year she tested + for mycoplasma and that was treated but did not resolve the pain issue.  Most recently had US  with gyn and advised to follow up here.  She has not had definitive testing for endometriosis. She says the pain is affecting her quality of life.  Her distracts from her ability to focus or be comforta\ble at [facility].  The pain is constant and does not change with menstrual cycle.  Pain worst with sitting/driving. Bowels moving normally.  No N/V  Also c/o itchy eye for 3 days on L since getting new lashes 3 days ago.  She ended up pulling the lashes on her L eye off.  Her eye has been red and itchy and now the upper lid is tender.    Needs RF valtrex   No problems updated.  ALLERGIES: Allergies  Allergen Reactions   Doxycycline  Hyclate Shortness Of Breath    PAST MEDICAL HISTORY: Past Medical History:  Diagnosis Date   Anxiety    Concussion    Depression    denies   Diabetes mellitus without complication (HCC)    Dysmenorrhea    Migraine without aura    STD (sexually transmitted disease)     MEDICATIONS AT HOME: Prior to Admission medications   Medication Sig Start Date End Date Taking? Authorizing Provider  bacitracin-polymyxin b (POLYSPORIN) ophthalmic ointment Place 1  Application into the left eye every 12 (twelve) hours. apply to eye every 12 hours while awake 04/09/24  Yes Memorie Yokoyama M, PA-C  naproxen  (NAPROSYN ) 500 MG tablet Take 1 tablet (500 mg total) by mouth 2 (two) times daily with a meal. Prn pain 04/09/24  Yes Sutter Ahlgren M, PA-C  olopatadine (PATANOL) 0.1 % ophthalmic solution Place 1 drop into both eyes 2 (two) times daily. 04/09/24  Yes Hassie Lint, PA-C  azithromycin  (ZITHROMAX ) 500 MG tablet Take 2 tablets by mouth once on day 1.  Then take 1 tablet by mouth once daily for 4 days. Patient not taking: Reported on 04/09/2024 11/12/23   Chrzanowski, Jami B, NP  fluconazole  (DIFLUCAN ) 150 MG tablet Take 1 tablet (150 mg total) by mouth every 3 (three) days. Patient not taking: Reported on 04/09/2024 10/24/23   Chrzanowski, Jami B, NP  ondansetron  (ZOFRAN -ODT) 4 MG disintegrating tablet Take 1 tablet (4 mg total) by mouth every 8 (eight) hours as needed for nausea or vomiting. Patient not taking: Reported on 04/09/2024 11/11/23   Franaszek, Amanda, PA-C  valACYclovir  (VALTREX ) 1000 MG tablet Take 1 tablet (1,000 mg total) by mouth 2 (two) times daily. 04/09/24   Hassie Lint, PA-C  albuterol  (PROVENTIL  HFA;VENTOLIN  HFA) 108 (90 Base) MCG/ACT  inhaler Inhale 2 puffs into the lungs every 4 (four) hours as needed for wheezing or shortness of breath (cough, shortness of breath or wheezing.). Patient not taking: Reported on 06/02/2018 08/23/16 10/20/19  Dain Drown, MD    ROS: Neg resp Neg cardiac\ Neg MS Neg psych Neg neuro  Objective:   Vitals:   04/09/24 1354 04/09/24 1355  BP:  133/87  Pulse:  72  SpO2:  95%  Weight: 196 lb 9.6 oz (89.2 kg)    Exam General appearance : Awake, alert, not in any distress. Speech Clear. Not toxic looking HEENT: Atraumatic and Normocephalic, pupils equally reactive to light and accomodation, EOM intact.  L bulbar conjunctivae with pale erythema and mild chemosis, L upper lateral lid with small  hordeolum Neck: Supple, no JVD. No cervical lymphadenopathy.  Chest: Good air entry bilaterally, CTAB.  No rales/rhonchi/wheezing CVS: S1 S2 regular, no murmurs.  Abdomen: Bowel sounds present, Non tender and not distended with no gaurding, rigidity or rebound. Extremities: B/L Lower Ext shows no edema, both legs are warm to touch Neurology: Awake alert, and oriented X 3, CN II-XII intact, Non focal Skin: No Rash  Data Review Lab Results  Component Value Date   HGBA1C 5.4 01/18/2020    Assessment & Plan   1. Pelvic cramping Normal [place]  Gyn deferred to PCP - naproxen  (NAPROSYN ) 500 MG tablet; Take 1 tablet (500 mg total) by mouth 2 (two) times daily with a meal. Prn pain  Dispense: 60 tablet; Refill: 3 - CT ABDOMEN PELVIS WO CONTRAST; Future  2. Pelvic pain (Primary) See #1 - naproxen  (NAPROSYN ) 500 MG tablet; Take 1 tablet (500 mg total) by mouth 2 (two) times daily with a meal. Prn pain  Dispense: 60 tablet; Refill: 3 - CT ABDOMEN PELVIS WO CONTRAST; Future  3. Vitamin D deficiency Per patient request - Vitamin D, 25-hydroxy  4. Hordeolum externum of left upper eyelid - bacitracin-polymyxin b (POLYSPORIN) ophthalmic ointment; Place 1 Application into the left eye every 12 (twelve) hours. apply to eye every 12 hours while awake  Dispense: 3.5 g; Refill: 0  5. Allergic conjunctivitis of left eye - olopatadine (PATANOL) 0.1 % ophthalmic solution; Place 1 drop into both eyes 2 (two) times daily.  Dispense: 5 mL; Refill: 12  Return in about 1 month (around 05/09/2024) for PCP for chronic conditions-recheck with Dr Elvan Hamel.  The patient was given clear instructions to go to ER or return to medical center if symptoms don't improve, worsen or new problems develop. The patient verbalized understanding. The patient was told to call to get lab results if they haven't heard anything in the next week.      Dulce Gibbs, PA-C [facility]  [place], [place] [phone]   04/09/2024, 2:56 PM

## 2024-04-09 NOTE — Patient Instructions (Signed)
Pelvic Pain, Female Pelvic pain is pain in your lower belly (abdomen), below your belly button and between your hips. The pain may: Start all of a sudden (be acute). Keep coming back (be recurring). Last a long time (become chronic). Pelvic pain that lasts longer than 6 months is called chronic pelvic pain. There are many causes of pelvic pain. Sometimes the cause of pelvic pain is not known. Follow these instructions at home:  Take over-the-counter and prescription medicines only as told by your doctor. Rest as told by your doctor. Do not have sex if it hurts. Keep a journal of your pelvic pain. Write down: When the pain started. Where the pain is located. What seems to make the pain better or worse, such as food or your monthly period (menstrual cycle). Any symptoms you have along with the pain. Keep all follow-up visits. Contact a doctor if: Medicine does not help your pain, or your pain comes back. You have new symptoms. You have unusual discharge or bleeding from your vagina. You have a fever or chills. You are having trouble pooping (constipation). You have blood in your pee (urine) or poop (stool). Your pee smells bad. You feel weak or light-headed. Get help right away if: You have sudden pain that is very bad. You have very bad pain and also have any of these symptoms: A fever. Feeling like you may vomit (nauseous). Vomiting. Being very sweaty. You faint. These symptoms may be an emergency. Get help right away. Call your local emergency services (911 in the [place] Do not wait to see if the symptoms will go away. Do not drive yourself to the hospital. Summary Pelvic pain is pain in your lower belly (abdomen), below your belly button and between your hips. There are many causes of pelvic pain. Keep a journal of your pelvic pain. This information is not intended to replace advice given to you by your health care provider. Make sure you discuss any questions you have with  your health care provider. Document Revised: 02/28/2021 Document Reviewed: 02/28/2021 Elsevier Patient Education  2024 Elsevier Inc.  

## 2024-04-10 ENCOUNTER — Other Ambulatory Visit: Payer: Self-pay | Admitting: Physician Assistant

## 2024-04-10 ENCOUNTER — Ambulatory Visit: Payer: Self-pay | Admitting: Physician Assistant

## 2024-04-10 DIAGNOSIS — E559 Vitamin D deficiency, unspecified: Secondary | ICD-10-CM

## 2024-04-10 LAB — VITAMIN D 25 HYDROXY (VIT D DEFICIENCY, FRACTURES): Vit D, 25-Hydroxy: 7 ng/mL — ABNORMAL LOW (ref 30.0–100.0)

## 2024-04-10 MED ORDER — VITAMIN D (ERGOCALCIFEROL) 1.25 MG (50000 UNIT) PO CAPS
50000.0000 [IU] | ORAL_CAPSULE | ORAL | 0 refills | Status: DC
Start: 2024-04-10 — End: 2024-09-24

## 2024-04-15 ENCOUNTER — Telehealth: Payer: Self-pay

## 2024-04-15 NOTE — Telephone Encounter (Signed)
 Copied from CRM [id number]. Topic: General - Other >> Apr 14, 2024 10:49 AM Emylou G wrote: Reason for CRM: Patient called mistakenly took vitamin D  two days in a row?  Is that okay? She said to please respond on her portal on mychart

## 2024-04-17 NOTE — Telephone Encounter (Signed)
 Called patient unable to make contact or leave voicemail,  I sent a mychart message regarding this

## 2024-04-20 ENCOUNTER — Telehealth: Payer: Self-pay

## 2024-04-20 ENCOUNTER — Ambulatory Visit: Payer: Self-pay

## 2024-04-20 NOTE — Telephone Encounter (Signed)
 Copied from CRM [id number]. Topic: Clinical - Medical Advice >> Apr 20, 2024  3:37 PM Hamp Levine R wrote: Reason for CRM: Patient states has been seen for the pain she is experiencing in her groin, internally in her vagina and stomach. Says she was prescribed naproxen  (NAPROSYN ) 500 MG tablet, but it has done nothing to help with the pain. States it makes her drowsy and unable to do her job. Patient is asking if there is anything else to prescribe that can assist with her symptoms.    See triage notes

## 2024-04-20 NOTE — Telephone Encounter (Signed)
 Reason for Disposition  Requesting regular office appointment    Pt would like information sent to PCP  Answer Assessment - Initial Assessment Questions 1. REASON FOR CALL or QUESTION: What is your reason for calling today? or How can I best help you? or What question do you have that I can help answer? Pt stated she was calling in early to request something different for pain.  LOV pt's PCP prescribed naproxen  for pain: pt stated the medication is not helping at all for pain.  Pt stated medication is making her really sleepy and she has trouble staying awake at work. Pt also stated she is waiting to find out her plan of care r/t diagnostic testing to find out what is causing her this pain. Please call pt.  Protocols used: Information Only Call - No Triage-A-AH

## 2024-04-28 ENCOUNTER — Ambulatory Visit: Payer: Self-pay | Admitting: *Deleted

## 2024-04-28 NOTE — Telephone Encounter (Signed)
 fyi

## 2024-04-28 NOTE — Telephone Encounter (Signed)
 FYI Only or Action Required?: Action required by provider: clinical question for provider. Wants to know if CT scan was ordered and wants to report that naproxen  is not working; it makes her tired at work.  Patient was last seen in primary care on 04/09/2024 by Ebony Jon HERO, PA-C. Called Nurse Triage reporting Pelvic Pain. Symptoms began today. Interventions attempted: OTC medications: naproxen . Symptoms are: unchanged.  Triage Disposition: See PCP Within 2 Weeks  Patient/caregiver understands and will follow disposition?: Yes     Reason for Disposition  Leg pain or muscle cramp is a chronic symptom (recurrent or ongoing AND present > 4 weeks)  Answer Assessment - Initial Assessment Questions 1. ONSET: When did the pain start?      Couple of years now 2. LOCATION: Where is the pain located?      Groin area  3. PAIN: How bad is the pain?    (Scale 1-10; or mild, moderate, severe)   -  MILD (1-3): doesn't interfere with normal activities    -  MODERATE (4-7): interferes with normal activities (e.g., work or school) or awakens from sleep, limping    -  SEVERE (8-10): excruciating pain, unable to do any normal activities, unable to walk     9/10 4. WORK OR EXERCISE: Has there been any recent work or exercise that involved this part of the body?      no 5. CAUSE: What do you think is causing the leg pain?     denies 6. OTHER SYMPTOMS: Do you have any other symptoms? (e.g., chest pain, back pain, breathing difficulty, swelling, rash, fever, numbness, weakness)     denies 7. PREGNANCY: Is there any chance you are pregnant? When was your last menstrual period?     na  Protocols used: Leg Pain-A-AH

## 2024-04-28 NOTE — Telephone Encounter (Signed)
 Patient disconnected before call could be transferred- attempted to call patient back for triage- she is at work and unable to talk- she has requested call back at 3pm. Patient advised we would reach out then- or she can call back when she is able.     Copied from CRM [id number]. Topic: Clinical - Red Word Triage >> Apr 28, 2024  2:01 PM Donee H wrote: Red Word that prompted transfer to Nurse Triage: Patient is experiencing severe pain in her groin area. Patient states she was prescribed naproxen  for pain but it's not help. She stated medication is making her really sleepy and she has trouble staying awake at work. She described pain at a 9 when standing and 10 when sitting down.

## 2024-04-29 NOTE — Telephone Encounter (Signed)
**Note De-identified  [place] Obfuscation** Please advise 

## 2024-04-30 NOTE — Telephone Encounter (Signed)
 Called patient and I will send message to provider regarding medication and I will send mychart message with information so she can schedule to ct

## 2024-04-30 NOTE — Telephone Encounter (Signed)
 Patient is wanting a alternative to naproxen 

## 2024-05-04 ENCOUNTER — Encounter: Payer: Self-pay | Admitting: Physician Assistant

## 2024-05-06 ENCOUNTER — Encounter: Payer: Self-pay | Admitting: Physician Assistant

## 2024-05-07 ENCOUNTER — Encounter: Payer: Self-pay | Admitting: Family Medicine

## 2024-05-07 ENCOUNTER — Ambulatory Visit (INDEPENDENT_AMBULATORY_CARE_PROVIDER_SITE_OTHER): Admitting: Family Medicine

## 2024-05-07 ENCOUNTER — Ambulatory Visit
Admission: RE | Admit: 2024-05-07 | Discharge: 2024-05-07 | Disposition: A | Discharge status: Home / Self Care | Source: Ambulatory Visit | Attending: Physician Assistant | Admitting: Physician Assistant

## 2024-05-07 VITALS — BP 113/75 | HR 70 | Temp 98.1°F | Resp 16 | Ht 62.0 in | Wt 201.0 lb

## 2024-05-07 DIAGNOSIS — R102 Pelvic and perineal pain: Secondary | ICD-10-CM

## 2024-05-07 DIAGNOSIS — E559 Vitamin D deficiency, unspecified: Secondary | ICD-10-CM

## 2024-05-07 NOTE — Progress Notes (Signed)
 Established Patient Office Visit  Subjective    Patient ID: Ebony Snyder, female    DOB: [birth year]  Age: 28 y.o. MRN: [id number]  CC:  Chief Complaint  Patient presents with   Medical Management of Chronic Issues    HPI Ebony Snyder presents for follow up of vitamin d  deficiency. Patient also reports that she will be having further imaging for her pelvic pain/spasms   Outpatient Encounter Medications as of 05/07/2024  Medication Sig   azithromycin  (ZITHROMAX ) 500 MG tablet Take 2 tablets by mouth once on day 1.  Then take 1 tablet by mouth once daily for 4 days.   bacitracin -polymyxin b  (POLYSPORIN ) ophthalmic ointment Place 1 Application into the left eye every 12 (twelve) hours. apply to eye every 12 hours while awake   fluconazole  (DIFLUCAN ) 150 MG tablet Take 1 tablet (150 mg total) by mouth every 3 (three) days.   naproxen  (NAPROSYN ) 500 MG tablet Take 1 tablet (500 mg total) by mouth 2 (two) times daily with a meal. Prn pain   olopatadine  (PATANOL) 0.1 % ophthalmic solution Place 1 drop into both eyes 2 (two) times daily.   ondansetron  (ZOFRAN -ODT) 4 MG disintegrating tablet Take 1 tablet (4 mg total) by mouth every 8 (eight) hours as needed for nausea or vomiting.   valACYclovir  (VALTREX ) 1000 MG tablet Take 1 tablet (1,000 mg total) by mouth 2 (two) times daily.   Vitamin D , Ergocalciferol , (DRISDOL ) 1.25 MG (50000 UNIT) CAPS capsule Take 1 capsule (50,000 Units total) by mouth every 7 (seven) days.   [DISCONTINUED] albuterol  (PROVENTIL  HFA;VENTOLIN  HFA) 108 (90 Base) MCG/ACT inhaler Inhale 2 puffs into the lungs every 4 (four) hours as needed for wheezing or shortness of breath (cough, shortness of breath or wheezing.). (Patient not taking: Reported on 06/02/2018)   No facility-administered encounter medications on file as of 05/07/2024.    Past Medical History:  Diagnosis Date   Anxiety    Concussion    Depression    denies   Diabetes mellitus without complication  (HCC)    Dysmenorrhea    Migraine without aura    STD (sexually transmitted disease)     Past Surgical History:  Procedure Laterality Date   CESAREAN SECTION N/A 02/21/2021   Procedure: CESAREAN SECTION;  Surgeon: Ebony Harari, MD;  Location: [facility] LD ORS;  Service: Obstetrics;  Laterality: N/A;  Arrest of Dilation   MOUTH SURGERY     NO PAST SURGERIES     WISDOM TOOTH EXTRACTION      Family History  Problem Relation Age of Onset   Hypertension Mother    Hypertension Father    Asthma Sister    Diabetes Maternal Grandmother    Early death Maternal Grandmother     Social History   Socioeconomic History   Marital status: Single    Spouse name: Not on file   Number of children: Not on file   Years of education: Not on file   Highest education level: Not on file  Occupational History   Not on file  Tobacco Use   Smoking status: Never    Passive exposure: Current   Smokeless tobacco: Never  Vaping Use   Vaping status: Never Used  Substance and Sexual Activity   Alcohol use: Yes    Comment: socially   Drug use: No   Sexual activity: Not Currently    Partners: Male    Birth control/protection: None, Condom    Comment: condoms 50% of the time, menarche  28 yo, sexual debut 28 yo  Other Topics Concern   Not on file  Social History Narrative   Not on file   Social Drivers of Health   Financial Resource Strain: Not on file  Food Insecurity: Not on file  Transportation Needs: Not on file  Physical Activity: Not on file  Stress: Not on file  Social Connections: Not on file  Intimate Partner Violence: Not on file    Review of Systems  All other systems reviewed and are negative.       Objective    BP 113/75   Pulse 70   Temp 98.1 F (36.7 C) (Oral)   Resp 16   Ht 5' 2 (1.575 m)   Wt 201 lb (91.2 kg)   LMP 04/17/2024 (Exact Date)   SpO2 96%   BMI 36.76 kg/m   Physical Exam Vitals and nursing note reviewed.  Constitutional:      General: She is  not in acute distress. Cardiovascular:     Rate and Rhythm: Normal rate and regular rhythm.  Pulmonary:     Effort: Pulmonary effort is normal.     Breath sounds: Normal breath sounds.  Abdominal:     Palpations: Abdomen is soft.     Tenderness: There is no abdominal tenderness.  Neurological:     General: No focal deficit present.     Mental Status: She is alert and oriented to person, place, and time.         Assessment & Plan:   1. Pelvic cramping (Primary) Keeps scheduled imaging appt.   2. Vitamin D  deficiency Patient to complete course of vitamin d  supplements and return for labs.     Return in about 2 months (around 07/08/2024) for follow up.   Ebony Raguel SQUIBB, MD

## 2024-05-12 ENCOUNTER — Telehealth: Payer: Self-pay | Admitting: Family Medicine

## 2024-05-12 NOTE — Telephone Encounter (Signed)
 FYI Only or Action Required?: Action required by provider: would like another medication instead of naprosyn , states makes her tired and does not help the pain.  Patient was last seen in primary care on 05/07/2024 by Tanda Bleacher, MD.  Called Nurse Triage reporting Advice Only.  Symptoms began several days ago.  Interventions attempted: Nothing.  Symptoms are: unchanged.  Triage Disposition: No disposition on file.  Patient/caregiver understands and will follow disposition?:     Would also like a referral to a pelvic specialist.

## 2024-05-12 NOTE — Telephone Encounter (Signed)
 Copied from CRM [id number]. Topic: Clinical - Prescription Issue >> May 12, 2024 10:43 AM Avram MATSU wrote: Reason for CRM: naproxen  (NAPROSYN ) 500 MG tablet [[id number]] patient stated the medication is not working and its making her sleepy. She would like a call back to talk about other options.   [phone] (M) after 3 if possible

## 2024-05-12 NOTE — Telephone Encounter (Signed)
 This encounter was created in error - please disregard.

## 2024-05-13 ENCOUNTER — Other Ambulatory Visit: Payer: Self-pay | Admitting: Family Medicine

## 2024-05-13 MED ORDER — TRAMADOL HCL 50 MG PO TABS: 50.0000 mg | ORAL_TABLET | Freq: Two times a day (BID) | ORAL | 0 refills | Status: DC | PRN

## 2024-05-19 ENCOUNTER — Encounter: Payer: Self-pay | Admitting: Family Medicine

## 2024-05-25 ENCOUNTER — Ambulatory Visit (HOSPITAL_COMMUNITY)

## 2024-05-25 ENCOUNTER — Ambulatory Visit: Payer: Self-pay

## 2024-05-25 ENCOUNTER — Ambulatory Visit
Admission: EM | Admit: 2024-05-25 | Discharge: 2024-05-25 | Disposition: A | Discharge status: Home / Self Care | Attending: Physician Assistant | Admitting: Physician Assistant

## 2024-05-25 ENCOUNTER — Encounter: Payer: Self-pay | Admitting: *Deleted

## 2024-05-25 ENCOUNTER — Other Ambulatory Visit: Payer: Self-pay

## 2024-05-25 DIAGNOSIS — N611 Abscess of the breast and nipple: Secondary | ICD-10-CM | POA: Diagnosis not present

## 2024-05-25 DIAGNOSIS — N644 Mastodynia: Secondary | ICD-10-CM | POA: Diagnosis not present

## 2024-05-25 MED ORDER — CEPHALEXIN 500 MG PO CAPS: 500.0000 mg | ORAL_CAPSULE | Freq: Four times a day (QID) | ORAL | 0 refills | Status: DC

## 2024-05-25 NOTE — ED Triage Notes (Signed)
 Cyst to right breast x 2 weeks, worse over the last 4 days. Not draining. States she has hx of cystic breasts

## 2024-05-25 NOTE — ED Provider Notes (Signed)
 [facility]    CSN: [id number] Arrival date & time: 05/25/24  1648      History   Chief Complaint Chief Complaint  Patient presents with   Breast Pain    HPI Ebony Snyder is a 28 y.o. female.   Patient presents today with a 2-week history of enlarging lesion in her right breast.  She has a history of a cyst to the breast and has required incision and drainage of abscesses/cysts in the past with last episode approximately 1 year ago.  She denies any recent breast trauma and she is not currently breast-feeding.  She reports the pain has gradually been worsening and is currently rated 10 on a 0-10 pain scale, described as throbbing, worse with manipulation or palpation of the areola/nipple.  No alleviating factors identified.  She denies any fever, nausea, vomiting.  She has not been taking any over-the-counter medications.  Denies any nipple discharge, nipple inversion, changes to her skin.  She has seen the breast center in the past but not within the past year and when she contacted them they requested a new referral prompting evaluation here.  She denies any recent antibiotics.  She is confident that she is not pregnant.    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   Acute upper respiratory infection 02/12/2023   Lump in lower inner quadrant of right breast 01/15/2022   Anemia, postpartum 04/05/2021   History of cesarean delivery 02/21/2021   History of gestational hypertension 02/20/2021   LGA (large for gestational age) fetus affecting management of mother 01/07/2021   History of gestational diabetes  12/16/2020   Genetic carrier 11/16/2020    Past Surgical History:  Procedure Laterality Date   CESAREAN SECTION N/A 02/21/2021   Procedure: CESAREAN SECTION;  Surgeon: Izell Harari, MD;  Location: [facility];  Service: Obstetrics;  Laterality: N/A;  Arrest of Dilation   MOUTH SURGERY     NO PAST SURGERIES     WISDOM TOOTH EXTRACTION      OB History     Gravida  2   Para  1   Term  1   Preterm      AB  1   Living  1      SAB      IAB      Ectopic      Multiple  0   Live Births  1            Home Medications    Prior to Admission medications   Medication Sig Start Date End Date Taking? Authorizing Provider  cephALEXin  (KEFLEX ) 500 MG capsule Take 1 capsule (500 mg total) by mouth 4 (four) times daily. 05/25/24  Yes Micharl Helmes K, PA-C  valACYclovir  (VALTREX ) 1000 MG tablet Take 1 tablet (1,000 mg total) by mouth 2 (two) times daily. 04/09/24  Yes McClung, Jon HERO, PA-C  Vitamin D , Ergocalciferol , (DRISDOL ) 1.25 MG (50000 UNIT) CAPS capsule Take 1 capsule (50,000 Units total) by mouth every 7 (seven) days. 04/10/24  Yes Danton Jon HERO, PA-C  albuterol  (PROVENTIL  HFA;VENTOLIN  HFA) 108 (90 Base) MCG/ACT inhaler Inhale 2 puffs into the lungs every 4 (four) hours as needed for wheezing or shortness of breath (cough, shortness of breath or wheezing.). Patient not taking: Reported on 06/02/2018 08/23/16 10/20/19  Mario Million, MD    Family History Family History  Problem Relation Age of Onset   Hypertension Mother    Hypertension Father    Asthma Sister    Diabetes Maternal Grandmother    Early death Maternal Grandmother     Social History Social History   Tobacco Use   Smoking status: Never    Passive exposure: Current   Smokeless tobacco: Never  Vaping Use   Vaping status: Never Used  Substance Use Topics   Alcohol use: Yes    Comment: socially   Drug use: No     Allergies   Doxycycline  hyclate   Review of Systems Review of Systems   Constitutional:  Negative for activity change, appetite change, fatigue and fever.  Respiratory:  Negative for shortness of breath.   Cardiovascular:  Negative for chest pain.  Gastrointestinal:  Negative for nausea and vomiting.  Skin:  Positive for color change. Negative for wound.  Neurological:  Negative for dizziness, light-headedness and headaches.     Physical Exam Triage Vital Signs ED Triage Vitals  Encounter Vitals Group     BP 05/25/24 1805 111/76     Girls Systolic BP Percentile --      Girls Diastolic BP Percentile --      Boys Systolic BP Percentile --      Boys Diastolic BP Percentile --      Pulse Rate 05/25/24 1805 68     Resp 05/25/24 1805 16     Temp 05/25/24 1805 98.2 F (36.8 C)     Temp Source 05/25/24 1805 Oral     SpO2 05/25/24 1805 95 %     Weight --      Height --      Head Circumference --      Peak Flow --      Pain Score 05/25/24 1803 10     Pain Loc --      Pain Education --      Exclude from Growth Chart --    No data found.  Updated Vital Signs BP 111/76 (BP Location: Left Arm)   Pulse 68   Temp 98.2 F (36.8 C) (Oral)   Resp 16   LMP 05/17/2024 (Exact Date)   SpO2 95%   Visual Acuity Right Eye Distance:   Left Eye Distance:   Bilateral Distance:    Right Eye Near:   Left Eye Near:    Bilateral Near:     Physical Exam Vitals reviewed.  Constitutional:      General: She is awake. She is not in acute distress.    Appearance: Normal appearance. She is well-developed. She is not ill-appearing.     Comments: Very pleasant female appears stated age in no acute distress holding her right breast obviously uncomfortable  HENT:     Head: Normocephalic and atraumatic.  Cardiovascular:     Rate and Rhythm: Normal rate and regular rhythm.     Heart sounds: Normal heart sounds, S1 normal and S2 normal. No murmur heard. Pulmonary:     Effort: Pulmonary effort is normal.     Breath sounds: Normal breath sounds. No wheezing, rhonchi  or rales.     Comments: Clear to auscultation bilaterally Chest:  Breasts:  Right: Mass and tenderness present. No inverted nipple, nipple discharge or skin change.     Left: No swelling, inverted nipple, mass, skin change or tenderness.     Comments: Indurated well-defined mass with associated tender to palpation behind right nipple.  No nipple inversion or discharge.  No lymphadenopathy. Lymphadenopathy:     Upper Body:     Right upper body: No supraclavicular or axillary adenopathy.     Left upper body: No supraclavicular or axillary adenopathy.  Psychiatric:        Behavior: Behavior is cooperative.      UC Treatments / Results  Labs (all labs ordered are listed, but only abnormal results are displayed) Labs Reviewed - No data to display  EKG   Radiology No results found.  Procedures Procedures (including critical care time)  Medications Ordered in UC Medications - No data to display  Initial Impression / Assessment and Plan / UC Course  I have reviewed the triage vital signs and the nursing notes.  Pertinent labs & imaging results that were available during my care of the patient were reviewed by me and considered in my medical decision making (see chart for details).     Patient is well-appearing, afebrile, nontoxic, nontachycardic.  No drainable fluid collection on exam.  Will start cephalexin  4 times daily for 7 days.  No indication for dose adjustment based on metabolic panel from 11/11/2023 with creatinine of 0.7 and calculated creatinine clearance of 170.68 mL/min.  She was encouraged use warm compresses and over-the-counter analgesics for pain relief.  Stat mammogram and ultrasound were ordered today we discussed that if she does not hear from someone to schedule this within a few days that she is to contact us  so we can check on the status of this.  We will establish her with the breast center as she may require another interventions to drain abscess if it does  not respond to antibiotics.  We discussed that if anything worsens and she has increasing pain, erythema that spreads rapidly, nausea, vomiting, fever she needs to be seen immediately.  Strict return precautions given.  Excuse note provided.  Final Clinical Impressions(s) / UC Diagnoses   Final diagnoses:  Subareolar breast abscess  Breast pain, right     Discharge Instructions      Start cephalexin  4 times daily for 1 week to cover for infection.  Continue with warm compresses and use over-the-counter medication including Tylenol  and ibuprofen  for pain relief.  I have placed a referral for a mammogram and ultrasound.  Someone should call you within the next several days and if you do not hear from them please let us  know.  If anything worsens and you have increasing pain, fever, nausea, vomiting you should be seen immediately.     ED Prescriptions     Medication Sig Dispense Auth. Provider   cephALEXin  (KEFLEX ) 500 MG capsule Take 1 capsule (500 mg total) by mouth 4 (four) times daily. 28 capsule Eura Mccauslin K, PA-C      PDMP not reviewed this encounter.   Sherrell Rocky POUR, PA-C 05/25/24 1823

## 2024-05-25 NOTE — Telephone Encounter (Signed)
 Pt scheduled with UC.

## 2024-05-25 NOTE — Discharge Instructions (Addendum)
 Start cephalexin  4 times daily for 1 week to cover for infection.  Continue with warm compresses and use over-the-counter medication including Tylenol  and ibuprofen  for pain relief.  I have placed a referral for a mammogram and ultrasound.  Someone should call you within the next several days and if you do not hear from them please let us  know.  If anything worsens and you have increasing pain, fever, nausea, vomiting you should be seen immediately.

## 2024-05-25 NOTE — Telephone Encounter (Signed)
 Please schedule appointment for breast pain

## 2024-05-25 NOTE — Telephone Encounter (Signed)
 FYI Only or Action Required?: FYI only for provider.  Patient was last seen in primary care on 05/07/2024 by Tanda Bleacher, MD.  Called Nurse Triage reporting Breast Problem.  Symptoms began several weeks ago.  Interventions attempted: Nothing.  Symptoms are: gradually worsening.  Triage Disposition: See HCP Within 4 Hours (Or PCP Triage)  Patient/caregiver understands and will follow disposition?: Yes   Copied from CRM [id number]. Topic: Clinical - Red Word Triage >> May 25, 2024 10:18 AM Emylou G wrote: [facility] that prompted transfer to Nurse Triage: Large Lump near her nipple.. hard to touch.. cyst ( unsure if there is fluid ) can't wear a bra 10/10 pain level >> May 25, 2024 10:24 AM Emylou G wrote: Patient dropped off.. tried to call her back.. no answer.  Reason for Disposition  [1] Breast looks infected (spreading redness, feels hot or painful to touch) AND [2] fever  Answer Assessment - Initial Assessment Questions 1. SYMPTOM: What's the main symptom you're concerned about?  (e.g., lump, nipple discharge, pain, rash)     Cyst on Breast  2. LOCATION: Where is the cyst located?     Right Breast  3.  ONSET: When did symptoms  start?      2 Weeks ago  4. PRIOR HISTORY: Do you have any history of prior problems with your breasts? (e.g., breast cancer, breast implant, fibrocystic breast disease)     Yes, has had this issue before  5. CAUSE: What do you think is causing this symptom?     History of Cystic Breasts  6. OTHER SYMPTOMS: Do you have any other symptoms? (e.g., breast pain, fever, nipple discharge, redness or rash)     Tender, Hardnened  7. PREGNANCY-BREASTFEEDING: Is there any chance you are pregnant? When was your last menstrual period? Are you breastfeeding?     No  Protocols used: Breast Symptoms-[facility]

## 2024-05-26 ENCOUNTER — Other Ambulatory Visit: Payer: Self-pay | Admitting: Physician Assistant

## 2024-05-26 DIAGNOSIS — N6001 Solitary cyst of right breast: Secondary | ICD-10-CM

## 2024-05-26 NOTE — Telephone Encounter (Signed)
 Form has been faxed to breast center

## 2024-05-26 NOTE — Telephone Encounter (Signed)
 Copied from CRM [id number]. Topic: General - Call Back - No Documentation >> May 26, 2024  8:50 AM Precious C wrote: Reason for CRM: Gwenn from the Breast Center called in on behalf of a patient. She stated the patient was seen in the ED yesterday and has a breast abscess. Gwenn needed to send a fax to the patient's provider. Dr. Tanda provided her with the appropriate fax number. Gwenn also requested that the provider sign and fax the form back once completed.

## 2024-05-27 ENCOUNTER — Inpatient Hospital Stay
Admission: RE | Admit: 2024-05-27 | Discharge: 2024-05-27 | Discharge status: Home / Self Care | Source: Ambulatory Visit | Attending: Physician Assistant | Admitting: Physician Assistant

## 2024-05-27 ENCOUNTER — Telehealth: Payer: Self-pay

## 2024-05-27 DIAGNOSIS — N6001 Solitary cyst of right breast: Secondary | ICD-10-CM

## 2024-05-27 NOTE — Telephone Encounter (Signed)
 Copied from CRM [id number]. Topic: Clinical - Request for Lab/Test Order >> May 27, 2024  9:52 AM Everette C wrote: Reason for CRM: The patient has been directed by staff at [facility] to please ask for their imaging order to be signed off by their PCP prior to their appointment at 1 PM today

## 2024-05-28 ENCOUNTER — Other Ambulatory Visit: Payer: Self-pay | Admitting: Physician Assistant

## 2024-05-28 ENCOUNTER — Ambulatory Visit (HOSPITAL_COMMUNITY): Payer: Self-pay

## 2024-05-28 DIAGNOSIS — N611 Abscess of the breast and nipple: Secondary | ICD-10-CM

## 2024-05-29 ENCOUNTER — Other Ambulatory Visit

## 2024-05-30 LAB — BODY FLUID CULTURE W GRAM STAIN

## 2024-06-01 ENCOUNTER — Telehealth: Payer: Self-pay

## 2024-06-01 NOTE — Telephone Encounter (Signed)
 Copied from CRM [id number]. Topic: General - Other >> Jun 01, 2024  3:25 PM Elle L wrote: Reason for CRM: Rock from [facility] called requesting to speak Dr. Luigi nurse regarding the patient's breast abscess test results and antibiotics. Their call back number is [phone]. Please call in the morning after 9.

## 2024-06-02 NOTE — Telephone Encounter (Signed)
 Per Rock at [facility] -  Patient had labs done on breast cyst: Staphylococcus lugdunensis  RARE STAPHYLOCOCCUS LUGDUNENSIS    Please review sensitivity report and prescribe appropriate antibiotics. Thank you!

## 2024-06-04 ENCOUNTER — Other Ambulatory Visit: Payer: Self-pay | Admitting: Family Medicine

## 2024-06-04 MED ORDER — SULFAMETHOXAZOLE-TRIMETHOPRIM 800-160 MG PO TABS: 1.0000 | ORAL_TABLET | Freq: Two times a day (BID) | ORAL | 0 refills | Status: DC

## 2024-06-05 ENCOUNTER — Ambulatory Visit
Admission: RE | Admit: 2024-06-05 | Discharge: 2024-06-05 | Disposition: A | Discharge status: Home / Self Care | Source: Ambulatory Visit | Attending: Physician Assistant | Admitting: Physician Assistant

## 2024-06-05 ENCOUNTER — Other Ambulatory Visit: Payer: Self-pay | Admitting: Physician Assistant

## 2024-06-05 DIAGNOSIS — N611 Abscess of the breast and nipple: Secondary | ICD-10-CM

## 2024-06-08 ENCOUNTER — Encounter: Payer: Self-pay | Admitting: Nurse Practitioner

## 2024-06-08 ENCOUNTER — Other Ambulatory Visit: Payer: Self-pay | Admitting: Family Medicine

## 2024-06-08 DIAGNOSIS — R102 Pelvic and perineal pain: Secondary | ICD-10-CM

## 2024-06-09 ENCOUNTER — Ambulatory Visit (INDEPENDENT_AMBULATORY_CARE_PROVIDER_SITE_OTHER): Admitting: Nurse Practitioner

## 2024-06-09 ENCOUNTER — Encounter: Payer: Self-pay | Admitting: Nurse Practitioner

## 2024-06-09 VITALS — BP 120/82 | HR 72

## 2024-06-09 DIAGNOSIS — Z30011 Encounter for initial prescription of contraceptive pills: Secondary | ICD-10-CM

## 2024-06-09 DIAGNOSIS — B9689 Other specified bacterial agents as the cause of diseases classified elsewhere: Secondary | ICD-10-CM

## 2024-06-09 DIAGNOSIS — N760 Acute vaginitis: Secondary | ICD-10-CM

## 2024-06-09 DIAGNOSIS — R102 Pelvic and perineal pain: Secondary | ICD-10-CM

## 2024-06-09 DIAGNOSIS — N9489 Other specified conditions associated with female genital organs and menstrual cycle: Secondary | ICD-10-CM

## 2024-06-09 DIAGNOSIS — Z113 Encounter for screening for infections with a predominantly sexual mode of transmission: Secondary | ICD-10-CM

## 2024-06-09 LAB — WET PREP FOR TRICH, YEAST, CLUE

## 2024-06-09 MED ORDER — METRONIDAZOLE 0.75 % VA GEL: 1.0000 | Freq: Every day | VAGINAL | 0 refills | Status: AC

## 2024-06-09 MED ORDER — IBUPROFEN 800 MG PO TABS: 800.0000 mg | ORAL_TABLET | Freq: Three times a day (TID) | ORAL | 1 refills | Status: AC | PRN

## 2024-06-09 MED ORDER — METRONIDAZOLE 0.75 % VA GEL: 1.0000 | VAGINAL | 0 refills | Status: DC

## 2024-06-09 MED ORDER — NORETHIN ACE-ETH ESTRAD-FE 1-20 MG-MCG PO TABS: 1.0000 | ORAL_TABLET | Freq: Every day | ORAL | 0 refills | Status: DC

## 2024-06-09 NOTE — Progress Notes (Addendum)
 Acute Office Visit  Subjective:    Patient ID: Ebony Snyder, female    DOB: 08-Sep-1996, 28 y.o.   MRN: [id number]   HPI 28 y.o. presents today for vaginal burning and pelvic pain. This has been ongoing for years. Began prior to having daughter in 2021. Pain occurs daily, after intercourse and when sitting for long periods. Pain radiates down legs all the way to toes. Does not feel it runs along buttock and down back of legs. Starting to interfere with her daily life. Has to leave work sometimes due to discomfort after sitting. Was sexually active recently for the first time in a year. Denies history of back injury. Cycles are regular, pain is not related to cycles. Vaginal burning is constant, internal and external. Denies discharge or odor. Negative US  04/02/24, CT 05/07/24 showed constipation, otherwise unremarkable. Feels constipation resolved. Emotional today. H/O recurrent BV. + mycoplasma/ureaplasma January 2025, symptoms did not resolve, neg retest 12/2023 but treated for BV at that time. Does feel symptoms get better while doing BV treatment but then comes right back. PCP sent referral to urogynecology but patient has not heard from anyone. H/O T2DM, migraine without aura. Was prescribed OCPs in the past for possible endo but did not start because she became pregnant.   Patient's last menstrual period was 05/17/2024 (exact date). Period Duration (Days): 5 Period Pattern: Regular Menstrual Flow: Heavy Menstrual Control: Maxi pad, Tampon Dysmenorrhea: (!) Severe Dysmenorrhea Symptoms: Cramping, Nausea, Headache  Review of Systems  Constitutional: Negative.   Genitourinary:  Positive for pelvic pain and vaginal pain. Negative for vaginal discharge.       Objective:    Physical Exam Constitutional:      Appearance: Normal appearance.  Genitourinary:    General: Normal vulva.     Vagina: Normal.     Cervix: Normal.     Uterus: Normal.      Adnexa: Right adnexa normal and left  adnexa normal.     Comments: Negative Q-tip test    BP 120/82   Pulse 72   LMP 05/17/2024 (Exact Date)   SpO2 98%  Wt Readings from Last 3 Encounters:  05/07/24 201 lb (91.2 kg)  04/09/24 196 lb 9.6 oz (89.2 kg)  03/12/24 194 lb 9.6 oz (88.3 kg)       Elenor Mole, NP student present as chaperone.   Wet prep + clue cells (+ odor)  Assessment & Plan:   Problem List Items Addressed This Visit   None Visit Diagnoses       Pelvic pain    -  Primary   Relevant Medications   ibuprofen  (ADVIL ) 800 MG tablet   norethindrone-ethinyl estradiol -FE (LOESTRIN FE) 1-20 MG-MCG tablet   Other Relevant Orders   Ambulatory referral to Physical Therapy     Vaginal burning       Relevant Orders   WET PREP FOR TRICH, YEAST, CLUE     Recurrent vaginitis       Relevant Medications   metroNIDAZOLE  (METROGEL ) 0.75 % vaginal gel (Start on 06/11/2024)     Screening examination for STD (sexually transmitted disease)       Relevant Orders   C. trachomatis/N. gonorrhoeae RNA     Bacterial vaginosis       Relevant Medications   metroNIDAZOLE  (METROGEL ) 0.75 % vaginal gel     Encounter for initial prescription of contraceptive pills       Relevant Medications   norethindrone-ethinyl estradiol -FE (LOESTRIN FE) 1-20 MG-MCG tablet  Plan: Wet prep positive for clue cells - Metrogel  0.75% nightly x 5 nights. Beginning one week later start Metrogel  twice weekly x 16 weeks for suppression. GC/CT pending. Will send referral to pelvic floor PT. Will also try a low dose birth control pill for 2-3 months. Ibuprofen  800 mg as needed for pain. Aware not to overuse due to GI risk. If no improvement pelvic floor specialist recommended.   Return in 2 months (on 08/09/2024) for Med follow up, pelvic pain.    [name] DNP, 12:45 PM 06/09/2024

## 2024-06-09 NOTE — Telephone Encounter (Signed)
 Spoke with patient, offered OV today at 1130, patient agreeable. OV cancelled for 8/14.   Routing to provider for final review. Patient is agreeable to disposition. Will close encounter.

## 2024-06-10 LAB — C. TRACHOMATIS/N. GONORRHOEAE RNA
C. trachomatis RNA, TMA: NOT DETECTED
N. gonorrhoeae RNA, TMA: NOT DETECTED

## 2024-06-11 ENCOUNTER — Ambulatory Visit: Payer: Self-pay | Admitting: Nurse Practitioner

## 2024-06-15 ENCOUNTER — Telehealth: Payer: Self-pay

## 2024-06-15 NOTE — Telephone Encounter (Signed)
 Patient called & left voicemail on triage line stating that all of her meds were thrown away & she wanted to see what she needed to do to get more.   I called her back X 2 & both times the call was answered & hungup without either one of us  getting to speak. Try call again later.

## 2024-06-18 ENCOUNTER — Ambulatory Visit: Admitting: Nurse Practitioner

## 2024-06-18 ENCOUNTER — Inpatient Hospital Stay: Admission: RE | Admit: 2024-06-18 | Source: Ambulatory Visit

## 2024-06-18 ENCOUNTER — Other Ambulatory Visit

## 2024-06-22 ENCOUNTER — Encounter: Payer: Self-pay | Admitting: Nurse Practitioner

## 2024-06-30 NOTE — Telephone Encounter (Signed)
 No callback from patient. Encounter closed. Routing to [place], NP.

## 2024-07-01 ENCOUNTER — Ambulatory Visit: Payer: Self-pay | Admitting: Family Medicine

## 2024-07-01 ENCOUNTER — Ambulatory Visit: Payer: Self-pay

## 2024-07-01 NOTE — Telephone Encounter (Signed)
 FYI Only or Action Required?: FYI only for provider.  Patient was last seen in primary care on 05/07/2024 by Tanda Bleacher, MD.  Called Nurse Triage reporting Migraine.  Symptoms began several days ago.  Interventions attempted: OTC medications: Excedrin Migraine.  Symptoms are: stable.  Triage Disposition: See PCP When Office is Open (Within 3 Days)  Patient/caregiver understands and will follow disposition?: Yes   Copied from CRM #[id number]. Topic: Clinical - Red Word Triage >> Jul 01, 2024 12:52 PM Winona R wrote: Migraine and weakness Reason for Disposition  [1] MILD-MODERATE headache AND [2] present > 3 days (72 hours) AND [3] no improvement after using Care Advice  Answer Assessment - Initial Assessment Questions Gets migraines often, Taking Excedrin Migraine with mild relief. Pt stated she also feels weak and fatigued, like she could have something viral or be pregnant. Denies neck stiffness, fever, eye pain.   1. LOCATION: Where does it hurt?      Middle of the head  2. ONSET: When did the headache start? (e.g., minutes, hours, days)      5 days ago 3. PATTERN: Does the pain come and go, or has it been constant since it started?     Constant  4. RECURRENT SYMPTOM: Have you ever had headaches before? If Yes, ask: When was the last time? and What happened that time?      Yes, but the weakness no  5. CAUSE: What do you think is causing the headache?     Unknown  6. MIGRAINE: Have you been diagnosed with migraine headaches? If Yes, ask: Is this headache similar?      Yes  9. OTHER SYMPTOMS: Do you have any other symptoms? (e.g., fever, stiff neck, eye pain, sore throat, cold symptoms)     Feels weak, fatigued, nausea, feels slightly congested.  Protocols used: [facility]

## 2024-07-01 NOTE — Telephone Encounter (Signed)
 Patient no showed appt that was made for her today

## 2024-08-03 ENCOUNTER — Ambulatory Visit: Attending: Nurse Practitioner | Admitting: Physical Therapy

## 2024-08-03 ENCOUNTER — Encounter: Payer: Self-pay | Admitting: Physical Therapy

## 2024-08-03 ENCOUNTER — Other Ambulatory Visit: Payer: Self-pay

## 2024-08-03 DIAGNOSIS — M545 Low back pain, unspecified: Secondary | ICD-10-CM | POA: Diagnosis present

## 2024-08-03 DIAGNOSIS — R1030 Lower abdominal pain, unspecified: Secondary | ICD-10-CM | POA: Diagnosis present

## 2024-08-03 DIAGNOSIS — R102 Pelvic and perineal pain: Secondary | ICD-10-CM | POA: Diagnosis not present

## 2024-08-03 DIAGNOSIS — R252 Cramp and spasm: Secondary | ICD-10-CM | POA: Insufficient documentation

## 2024-08-03 NOTE — Therapy (Signed)
 OUTPATIENT PHYSICAL THERAPY FEMALE PELVIC EVALUATION   Patient Name: Ebony Snyder MRN: [id number] DOB:21-Apr-1996, 28 y.o., female Today's Date: 08/03/2024  END OF SESSION:  PT End of Session - 08/03/24 0853     Visit Number 1    Date for Recertification  01/31/25    Authorization Type [employer] medicaid    PT Start Time 0845    PT Stop Time 0925    PT Time Calculation (min) 40 min    Activity Tolerance Patient tolerated treatment well    Behavior During Therapy WFL for tasks assessed/performed          Past Medical History:  Diagnosis Date   Anxiety    Concussion    Depression    denies   Diabetes mellitus without complication (HCC)    gestational diabetes per pt   Dysmenorrhea    Migraine without aura    STD (sexually transmitted disease)    Past Surgical History:  Procedure Laterality Date   CESAREAN SECTION N/A 02/21/2021   Procedure: CESAREAN SECTION;  Surgeon: Izell Harari, MD;  Location: [facility];  Service: Obstetrics;  Laterality: N/A;  Arrest of Dilation   MOUTH SURGERY     NO PAST SURGERIES     WISDOM TOOTH EXTRACTION     Patient Active Problem List   Diagnosis Date Noted   Diabetes mellitus without complication (HCC)    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   Acute upper respiratory infection 02/12/2023   Lump in lower inner quadrant of right breast 01/15/2022   Anemia, postpartum 04/05/2021   History of cesarean delivery 02/21/2021   History of gestational hypertension 02/20/2021   LGA (large for gestational age) fetus affecting management of mother 01/07/2021   History of gestational diabetes 12/16/2020   Genetic carrier 11/16/2020    PCP: Tanda Bleacher, MD  REFERRING PROVIDER: Prentiss Annabella LABOR, NP   REFERRING DIAG: R10.2 (ICD-10-CM) - Pelvic pain   THERAPY DIAG:  Cramp and spasm  Midline low back pain without sciatica, unspecified chronicity  Lower abdominal pain  Rationale for Evaluation and Treatment:  Rehabilitation  ONSET DATE: 09/2020  SUBJECTIVE:                                                                                                                                                                                           SUBJECTIVE STATEMENT: Patient had onset of pain that was like a deep burning feeling in the vagina. Her cramps feel clenching and is all day everyday.    FUNCTIONAL LIMITATIONS: none  PERTINENT HISTORY:  Surgeries: Cesarean section 02/21/2021 Sexual abuse: No  PAIN:  Are you  having pain? Yes NPRS scale: 8/10 Pain location: Vaginal and deep  Pain type: burning and cramping Pain description: constant   Aggravating factors: sitting  Relieving factors: none  PAIN:  Are you having pain? Yes: NPRS scale: 8 Pain location: lumbar Pain description: dull, achy,sharp; intermittent Aggravating factors: lifting, bending down Relieving factors: time   PRECAUTIONS: None  RED FLAGS: None   WEIGHT BEARING RESTRICTIONS: No  FALLS:  Has patient fallen in last 6 months? 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[employer]  ACTIVITY LEVEL : walking  PLOF: Independent  PATIENT GOALS: locate where the pain is coming from, reduce pain   BOWEL MOVEMENT: no issues Pain with bowel movement: No Type of bowel movement:Strain no Fully empty rectum: Yes:   Leakage: No                                                   Pads: No Fiber supplement/laxative No  URINATION: Pain with urination: No Fully empty bladder: Yes:                                  Post-void dribble: No Stream: Strong Urgency: No Frequency:during the day every couple of hours                                                         Leakage: none Pads/briefs: No  INTERCOURSE:  Ability to have vaginal penetration No  Pain with intercourse: none Dryness: No Climax: yes Marinoff Scale: 0/3 Lubricant:no  PREGNANCY: Vaginal deliveries 0 C-section deliveries 1 Currently  pregnant No  PROLAPSE: None   OBJECTIVE:  Note: Objective measures were completed at Evaluation unless otherwise noted.  DIAGNOSTIC FINDINGS:  none  PATIENT SURVEYS:  PFIQ-7: 56 POPIQ-7: 62  COGNITION: Overall cognitive status: Within functional limits for tasks assessed     SENSATION: Light touch: Appears intact    FUNCTIONAL TESTS:  Single leg stance:  Mu:ozjw to the left  Ou:ozjw to the left Squat:able to squat without difficulty Bed mobility:good   POSTURE: rounded shoulders and forward head   LUMBARAROM/PROM:  A/PROM A/PROM  Eval (% available)  Flexion Full flexion and while she comes up she will lean to the left  Right rotation 75%   (Blank rows = not tested)  LOWER EXTREMITY MNF:uphyuwzdd at endrange of left hip flexion and External rotation   LOWER EXTREMITY MMT:  MMT Right eval Left eval  Hip flexion 4/5 5/5  Hip extension 4/5 4/5  Hip abduction 5/5 3+/5  Hip adduction 4/5 4/5   (Blank rows = not tested) PALPATION:  Abdominal: tenderness located in the lower abdomen; bulges the lower abdomen when contracting  Diastasis: No Distortion: tightness just above the C-section scar  Breathing: no issues Scar tissue: Yes: restrictions along the C-section scar                External Perineal Exam: no tenderness along the muscles                             Internal Pelvic Floor:  no tenderness   Patient confirms identification and approves PT to assess internal pelvic floor and treatment Yes No emotional/communication barriers or cognitive limitation. Patient is motivated to learn. Patient understands and agrees with treatment goals and plan. PT explains patient will be examined in standing, sitting, and lying down to see how their muscles and joints work. When they are ready, they will be asked to remove their underwear so PT can examine their perineum. The patient is also given the option of providing their own chaperone as one is not provided in  our facility. The patient also has the right and is explained the right to defer or refuse any part of the evaluation or treatment including the internal exam. With the patient's consent, PT will use one gloved finger to gently assess the muscles of the pelvic floor, seeing how well it contracts and relaxes and if there is muscle symmetry. After, the patient will get dressed and PT and patient will discuss exam findings and plan of care. PT and patient discuss plan of care, schedule, attendance policy and HEP activities.   PELVIC MMT:   MMT eval  Vaginal 4/5; 10 s  (Blank rows = not tested)         TODAY'S TREATMENT:                                                                                                                              DATE: 08/03/24  EVAL Examination completed, findings reviewed, pt educated on POC, HEP, and female pelvic floor anatomy, reasoning with pelvic floor assessment internally with pt consent. Pt motivated to participate in PT and agreeable to attempt recommendations.     PATIENT EDUCATION:  08/03/24 Education details: educated patient on abdominal scar massage to do at home Person educated: Patient Education method: Explanation, Demonstration, Tactile cues, Verbal cues, and Handouts Education comprehension: verbalized understanding, returned demonstration, verbal cues required, tactile cues required, and needs further education  HOME EXERCISE PROGRAM: See above.    For all possible CPT codes, reference the Planned Interventions line above.     Check all conditions that are expected to impact treatment: {Conditions expected to impact treatment:None of these apply   If treatment provided at initial evaluation, no treatment charged due to lack of authorization.      ASSESSMENT:  CLINICAL IMPRESSION: Patient is a 28 y.o. female who was seen today for physical therapy evaluation and treatment for Pelvic pain. Patient reports constant vaginal pain at level  8/10 that is worse with sitting. Lumbar pain is intermittent at level 8/10 and is worse with sitting and lifting. Patient is able to flex her spine but will deviate to the left going back to neutral. She has tenderness in the lower abdomen. She has restrictions in the C-section scar. She will bulge her abdomen when asked to contract the abdomen. She has weakness in her hips. Pelvic floor strength is 4/5 with no tenderness throughout the pelvic floor  muscles. Patient will benefit from skilled therapy to reduce pain and improve quality of life.   OBJECTIVE IMPAIRMENTS: decreased ROM, decreased strength, increased fascial restrictions, increased muscle spasms, and pain.   ACTIVITY LIMITATIONS: lifting and sitting  PARTICIPATION LIMITATIONS: driving and occupation  PERSONAL FACTORS: Time since onset of injury/illness/exacerbation are also affecting patient's functional outcome.   REHAB POTENTIAL: Excellent  CLINICAL DECISION MAKING: Stable/uncomplicated  EVALUATION COMPLEXITY: Low   GOALS: Goals reviewed with patient? Yes  SHORT TERM GOALS: Target date: 08/31/24  Patient educated on scar massage for the lower abdomen to improve tissue mobility.  Baseline:not educated yet Goal status: INITIAL   LONG TERM GOALS: Target date: 01/31/2025  Patient independent with advanced HEP  Baseline: not educated yet Goal status: INITIAL  2.  Patient is able to contract her abdominals without bulging so she is able to lift items with back pain decreased >/= 1-2/10.  Baseline: pain level 8/10 Goal status: INITIAL  3.  Patient reports her lower abdominal and vaginal pain decreased </= 1-2/10 due to reduction of restrictions.  Baseline: Pain level 8/10 Goal status: INITIAL  4.  Patient is able to flex forward then come to neutral without deviating due to reduction  of lumbar pain </= 1-2/10 Baseline:  Goal status: INITIAL   PLAN:  PT FREQUENCY: 1-2x/week  PT DURATION: 6 months  PLANNED  INTERVENTIONS: 97110-Therapeutic exercises, 97530- Therapeutic activity, 97112- Neuromuscular re-education, 97535- Self Care, 02859- Manual therapy, 20560 (1-2 muscles), 20561 (3+ muscles)- Dry Needling, Patient/Family education, Taping, Joint mobilization, Spinal mobilization, Scar mobilization, Cryotherapy, Moist heat, and Biofeedback  PLAN FOR NEXT SESSION: manual work to the lower abdomen work on the c-section scar, diaphragmatic breathing, core engagement, show how to lift correctly   Channing Pereyra, PT 08/03/24 9:43 AM

## 2024-08-13 ENCOUNTER — Ambulatory Visit: Payer: Self-pay | Attending: Nurse Practitioner | Admitting: Physical Therapy

## 2024-08-13 DIAGNOSIS — M545 Low back pain, unspecified: Secondary | ICD-10-CM | POA: Insufficient documentation

## 2024-08-13 DIAGNOSIS — M6281 Muscle weakness (generalized): Secondary | ICD-10-CM | POA: Diagnosis present

## 2024-08-13 DIAGNOSIS — R279 Unspecified lack of coordination: Secondary | ICD-10-CM | POA: Diagnosis present

## 2024-08-13 DIAGNOSIS — R1030 Lower abdominal pain, unspecified: Secondary | ICD-10-CM | POA: Insufficient documentation

## 2024-08-13 DIAGNOSIS — R252 Cramp and spasm: Secondary | ICD-10-CM | POA: Insufficient documentation

## 2024-08-13 NOTE — Therapy (Signed)
 OUTPATIENT PHYSICAL THERAPY FEMALE PELVIC EVALUATION   Patient Name: Ebony Snyder MRN: [id number] DOB:25-Feb-1996, 28 y.o., female Today's Date: 08/13/2024  END OF SESSION:  PT End of Session - 08/13/24 0758     Visit Number 2    Date for Recertification  01/31/25    Authorization Type [employer] medicaid    PT Start Time [id number]    PT Stop Time 0758    PT Time Calculation (min) 42 min    Activity Tolerance Patient tolerated treatment well    Behavior During Therapy WFL for tasks assessed/performed           Past Medical History:  Diagnosis Date   Anxiety    Concussion    Depression    denies   Diabetes mellitus without complication (HCC)    gestational diabetes per pt   Dysmenorrhea    Migraine without aura    STD (sexually transmitted disease)    Past Surgical History:  Procedure Laterality Date   CESAREAN SECTION N/A 02/21/2021   Procedure: CESAREAN SECTION;  Surgeon: Izell Harari, MD;  Location: [facility];  Service: Obstetrics;  Laterality: N/A;  Arrest of Dilation   MOUTH SURGERY     NO PAST SURGERIES     WISDOM TOOTH EXTRACTION     Patient Active Problem List   Diagnosis Date Noted   Diabetes mellitus without complication (HCC)    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   Acute upper respiratory infection 02/12/2023   Lump in lower inner quadrant of right breast 01/15/2022   Anemia, postpartum 04/05/2021   History of cesarean delivery 02/21/2021   History of gestational hypertension 02/20/2021   LGA (large for gestational age) fetus affecting management of mother 01/07/2021   History of gestational diabetes 12/16/2020   Genetic carrier 11/16/2020    PCP: Tanda Bleacher, MD  REFERRING PROVIDER: Prentiss Annabella LABOR, NP   REFERRING DIAG: R10.2 (ICD-10-CM) - Pelvic pain   THERAPY DIAG:  Cramp and spasm  Midline low back pain without sciatica, unspecified chronicity  Lower abdominal pain  Rationale for Evaluation and Treatment:  Rehabilitation  ONSET DATE: 09/2020  SUBJECTIVE:                                                                                                                                                                                           SUBJECTIVE STATEMENT: Patient had onset of pain that was like a deep burning feeling in the vagina. Her cramps feel clenching and is all day everyday.    FUNCTIONAL LIMITATIONS: none  PERTINENT HISTORY:  Surgeries: Cesarean section 02/21/2021 Sexual abuse: No   PAIN:  Are you having pain? Yes: NPRS scale: 9 Pain location: lumbar Pain description: dull, achy,sharp; intermittent Aggravating factors: lifting, bending down Relieving factors: time   PRECAUTIONS: None  RED FLAGS: None   WEIGHT BEARING RESTRICTIONS: No  FALLS:  Has patient fallen in last 6 months? 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[employer]  ACTIVITY LEVEL : walking  PLOF: Independent  PATIENT GOALS: locate where the pain is coming from, reduce pain   BOWEL MOVEMENT: no issues Pain with bowel movement: No Type of bowel movement:Strain no Fully empty rectum: Yes:   Leakage: No                                                   Pads: No Fiber supplement/laxative No  URINATION: Pain with urination: No Fully empty bladder: Yes:                                  Post-void dribble: No Stream: Strong Urgency: No Frequency:during the day every couple of hours                                                         Leakage: none Pads/briefs: No  INTERCOURSE:  Ability to have vaginal penetration No  Pain with intercourse: none Dryness: No Climax: yes Marinoff Scale: 0/3 Lubricant:no  PREGNANCY: Vaginal deliveries 0 C-section deliveries 1 Currently pregnant No  PROLAPSE: None   OBJECTIVE:  Note: Objective measures were completed at Evaluation unless otherwise noted.  DIAGNOSTIC FINDINGS:  none  PATIENT SURVEYS:  PFIQ-7: 31 POPIQ-7:  62  COGNITION: Overall cognitive status: Within functional limits for tasks assessed     SENSATION: Light touch: Appears intact    FUNCTIONAL TESTS:  Single leg stance:  Mu:ozjw to the left  Ou:ozjw to the left Squat:able to squat without difficulty Bed mobility:good   POSTURE: rounded shoulders and forward head   LUMBARAROM/PROM:  A/PROM A/PROM  Eval (% available)  Flexion Full flexion and while she comes up she will lean to the left  Right rotation 75%   (Blank rows = not tested)  LOWER EXTREMITY MNF:uphyuwzdd at endrange of left hip flexion and External rotation   LOWER EXTREMITY MMT:  MMT Right eval Left eval  Hip flexion 4/5 5/5  Hip extension 4/5 4/5  Hip abduction 5/5 3+/5  Hip adduction 4/5 4/5   (Blank rows = not tested) PALPATION:  Abdominal: tenderness located in the lower abdomen; bulges the lower abdomen when contracting  Diastasis: No Distortion: tightness just above the C-section scar  Breathing: no issues Scar tissue: Yes: restrictions along the C-section scar                External Perineal Exam: no tenderness along the muscles                             Internal Pelvic Floor: no tenderness   Patient confirms identification and approves PT to assess internal pelvic floor and treatment Yes No emotional/communication barriers or cognitive limitation. Patient is motivated to learn. Patient understands and  agrees with treatment goals and plan. PT explains patient will be examined in standing, sitting, and lying down to see how their muscles and joints work. When they are ready, they will be asked to remove their underwear so PT can examine their perineum. The patient is also given the option of providing their own chaperone as one is not provided in our facility. The patient also has the right and is explained the right to defer or refuse any part of the evaluation or treatment including the internal exam. With the patient's consent, PT will use  one gloved finger to gently assess the muscles of the pelvic floor, seeing how well it contracts and relaxes and if there is muscle symmetry. After, the patient will get dressed and PT and patient will discuss exam findings and plan of care. PT and patient discuss plan of care, schedule, attendance policy and HEP activities.   PELVIC MMT:   MMT eval  Vaginal 4/5; 10 s  (Blank rows = not tested)         TODAY'S TREATMENT:                                                                                                                              DATE:  08/03/24  EVAL Examination completed, findings reviewed, pt educated on POC, HEP, and female pelvic floor anatomy, reasoning with pelvic floor assessment internally with pt consent. Pt motivated to participate in PT and agreeable to attempt recommendations.    08/13/24: Supine diaphragmatic breathing x20 with moist heat pad at abdomen for decreased pain and improved tissue mobility, x4 layers between skin and pack - no adverse reactions post removal.  Lumbar rotations x15 Windshield wipers x15 Single to chest 3x30s each Butterfly stretch 3x30s Pudenudal nerve glides in standing (squat and stand with head flexion/extension) x5 no change in symptoms Seated pelvic tilts x10 2x10 transverse abdominis activation - needs max cues for technique Manual at lower abdomen with fascial release and scar massage complete for decreased tension in lower abdomen and promote improved mobility at pelvis  PATIENT EDUCATION:  08/03/24 Education details: educated patient on abdominal scar massage to do at home, Access Code [email or web address] Person educated: Patient Education method: Explanation, Demonstration, Tactile cues, Verbal cues, and Handouts Education comprehension: verbalized understanding, returned demonstration, verbal cues required, tactile cues required, and needs further education  HOME EXERCISE PROGRAM: [email or web address]      ASSESSMENT:  CLINICAL  IMPRESSION: Patient is a 28 y.o. female who was seen today for physical therapy treatment for Pelvic pain. Pt demonstrated decreased mobility with tension at low back and abdomen session focused on mobility here with stretching and diaphragmatic breathing with pelvic drops as needed. Progressed to beginning core strengthening with pt having weak transverse abdominis and poor mechanics without max cues. Also has tension at c-section scar and mobility completed here with moderate release felt. Pt did report decreased pain to 6/10 at end of session. Patient will benefit  from skilled therapy to reduce pain and improve quality of life.   OBJECTIVE IMPAIRMENTS: decreased ROM, decreased strength, increased fascial restrictions, increased muscle spasms, and pain.   ACTIVITY LIMITATIONS: lifting and sitting  PARTICIPATION LIMITATIONS: driving and occupation  PERSONAL FACTORS: Time since onset of injury/illness/exacerbation are also affecting patient's functional outcome.   REHAB POTENTIAL: Excellent  CLINICAL DECISION MAKING: Stable/uncomplicated  EVALUATION COMPLEXITY: Low   GOALS: Goals reviewed with patient? Yes  SHORT TERM GOALS: Target date: 08/31/24  Patient educated on scar massage for the lower abdomen to improve tissue mobility.  Baseline:not educated yet Goal status: INITIAL   LONG TERM GOALS: Target date: 01/31/2025  Patient independent with advanced HEP  Baseline: not educated yet Goal status: INITIAL  2.  Patient is able to contract her abdominals without bulging so she is able to lift items with back pain decreased >/= 1-2/10.  Baseline: pain level 8/10 Goal status: INITIAL  3.  Patient reports her lower abdominal and vaginal pain decreased </= 1-2/10 due to reduction of restrictions.  Baseline: Pain level 8/10 Goal status: INITIAL  4.  Patient is able to flex forward then come to neutral without deviating due to reduction  of lumbar pain </= 1-2/10 Baseline:  Goal  status: INITIAL   PLAN:  PT FREQUENCY: 1-2x/week  PT DURATION: 6 months  PLANNED INTERVENTIONS: 97110-Therapeutic exercises, 97530- Therapeutic activity, 97112- Neuromuscular re-education, 97535- Self Care, 02859- Manual therapy, 20560 (1-2 muscles), 20561 (3+ muscles)- Dry Needling, Patient/Family education, Taping, Joint mobilization, Spinal mobilization, Scar mobilization, Cryotherapy, Moist heat, and Biofeedback  PLAN FOR NEXT SESSION: manual work to the lower abdomen work on the c-section scar, diaphragmatic breathing, core engagement, show how to lift correctly   Darryle Navy, PT, DPT 10/09/258:02 AM  [facility] [address] [place], [place] [address] Phone # [phone] Fax [phone]

## 2024-08-17 ENCOUNTER — Ambulatory Visit: Admitting: Physical Therapy

## 2024-08-17 DIAGNOSIS — R252 Cramp and spasm: Secondary | ICD-10-CM

## 2024-08-17 DIAGNOSIS — M545 Low back pain, unspecified: Secondary | ICD-10-CM

## 2024-08-17 DIAGNOSIS — R1030 Lower abdominal pain, unspecified: Secondary | ICD-10-CM

## 2024-08-17 NOTE — Therapy (Signed)
 OUTPATIENT PHYSICAL THERAPY FEMALE PELVIC EVALUATION   Patient Name: Ebony Snyder MRN: [id number] DOB:1996-09-30, 28 y.o., female Today's Date: 08/17/2024  END OF SESSION:  PT End of Session - 08/17/24 1620     Visit Number 3    Date for Recertification  01/31/25    Authorization Type [employer] medicaid    Authorization Time Period No author req until 09/05/24    PT Start Time 1620    PT Stop Time 1659    PT Time Calculation (min) 39 min    Activity Tolerance Patient tolerated treatment well    Behavior During Therapy WFL for tasks assessed/performed           Past Medical History:  Diagnosis Date   Anxiety    Concussion    Depression    denies   Diabetes mellitus without complication (HCC)    gestational diabetes per pt   Dysmenorrhea    Migraine without aura    STD (sexually transmitted disease)    Past Surgical History:  Procedure Laterality Date   CESAREAN SECTION N/A 02/21/2021   Procedure: CESAREAN SECTION;  Surgeon: Izell Harari, MD;  Location: [facility];  Service: Obstetrics;  Laterality: N/A;  Arrest of Dilation   MOUTH SURGERY     NO PAST SURGERIES     WISDOM TOOTH EXTRACTION     Patient Active Problem List   Diagnosis Date Noted   Diabetes mellitus without complication (HCC)    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   Acute upper respiratory infection 02/12/2023   Lump in lower inner quadrant of right breast 01/15/2022   Anemia, postpartum 04/05/2021   History of cesarean delivery 02/21/2021   History of gestational hypertension 02/20/2021   LGA (large for gestational age) fetus affecting management of mother 01/07/2021   History of gestational diabetes 12/16/2020   Genetic carrier 11/16/2020    PCP: Tanda Bleacher, MD  REFERRING PROVIDER: Prentiss Annabella LABOR, NP   REFERRING DIAG: R10.2 (ICD-10-CM) - Pelvic pain   THERAPY DIAG:  Cramp and spasm  Midline low back pain without sciatica, unspecified chronicity  Lower abdominal  pain  Rationale for Evaluation and Treatment: Rehabilitation  ONSET DATE: 09/2020  SUBJECTIVE:                                                                                                                                                                                           SUBJECTIVE STATEMENT: Started period to day and pain is very high    FUNCTIONAL LIMITATIONS: none  PERTINENT HISTORY:  Surgeries: Cesarean section 02/21/2021 Sexual abuse: No   PAIN:  Are you having pain?  Yes: NPRS scale: 10 Pain location: lumbar, vaginal, abdomen Pain description: dull, achy,sharp; intermittent Aggravating factors: lifting, bending down Relieving factors: time   PRECAUTIONS: None  RED FLAGS: None   WEIGHT BEARING RESTRICTIONS: No  FALLS:  Has patient fallen in last 6 months? 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[employer]  ACTIVITY LEVEL : walking  PLOF: Independent  PATIENT GOALS: locate where the pain is coming from, reduce pain   BOWEL MOVEMENT: no issues Pain with bowel movement: No Type of bowel movement:Strain no Fully empty rectum: Yes:   Leakage: No                                                   Pads: No Fiber supplement/laxative No  URINATION: Pain with urination: No Fully empty bladder: Yes:                                  Post-void dribble: No Stream: Strong Urgency: No Frequency:during the day every couple of hours                                                         Leakage: none Pads/briefs: No  INTERCOURSE:  Ability to have vaginal penetration No  Pain with intercourse: none Dryness: No Climax: yes Marinoff Scale: 0/3 Lubricant:no  PREGNANCY: Vaginal deliveries 0 C-section deliveries 1 Currently pregnant No  PROLAPSE: None   OBJECTIVE:  Note: Objective measures were completed at Evaluation unless otherwise noted.  DIAGNOSTIC FINDINGS:  none  PATIENT SURVEYS:  PFIQ-7: 60 POPIQ-7: 62  COGNITION: Overall  cognitive status: Within functional limits for tasks assessed     SENSATION: Light touch: Appears intact    FUNCTIONAL TESTS:  Single leg stance:  Mu:ozjw to the left  Ou:ozjw to the left Squat:able to squat without difficulty Bed mobility:good   POSTURE: rounded shoulders and forward head   LUMBARAROM/PROM:  A/PROM A/PROM  Eval (% available)  Flexion Full flexion and while she comes up she will lean to the left  Right rotation 75%   (Blank rows = not tested)  LOWER EXTREMITY MNF:uphyuwzdd at endrange of left hip flexion and External rotation   LOWER EXTREMITY MMT:  MMT Right eval Left eval  Hip flexion 4/5 5/5  Hip extension 4/5 4/5  Hip abduction 5/5 3+/5  Hip adduction 4/5 4/5   (Blank rows = not tested) PALPATION:  Abdominal: tenderness located in the lower abdomen; bulges the lower abdomen when contracting  Diastasis: No Distortion: tightness just above the C-section scar  Breathing: no issues Scar tissue: Yes: restrictions along the C-section scar                External Perineal Exam: no tenderness along the muscles                             Internal Pelvic Floor: no tenderness   Patient confirms identification and approves PT to assess internal pelvic floor and treatment Yes No emotional/communication barriers or cognitive limitation. Patient is motivated to learn. Patient understands and agrees with  treatment goals and plan. PT explains patient will be examined in standing, sitting, and lying down to see how their muscles and joints work. When they are ready, they will be asked to remove their underwear so PT can examine their perineum. The patient is also given the option of providing their own chaperone as one is not provided in our facility. The patient also has the right and is explained the right to defer or refuse any part of the evaluation or treatment including the internal exam. With the patient's consent, PT will use one gloved finger to gently  assess the muscles of the pelvic floor, seeing how well it contracts and relaxes and if there is muscle symmetry. After, the patient will get dressed and PT and patient will discuss exam findings and plan of care. PT and patient discuss plan of care, schedule, attendance policy and HEP activities.   PELVIC MMT:   MMT eval  Vaginal 4/5; 10 s  (Blank rows = not tested)         TODAY'S TREATMENT:                                                                                                                              DATE:    08/13/24: Supine diaphragmatic breathing x20 with moist heat pad at abdomen for decreased pain and improved tissue mobility, x4 layers between skin and pack - no adverse reactions post removal.  Lumbar rotations x15 Windshield wipers x15 Single to chest 3x30s each Butterfly stretch 3x30s Pudenudal nerve glides in standing (squat and stand with head flexion/extension) x5 no change in symptoms Seated pelvic tilts x10 2x10 transverse abdominis activation - needs max cues for technique Manual at lower abdomen with fascial release and scar massage complete for decreased tension in lower abdomen and promote improved mobility at pelvis  08/17/24: Supine diaphragmatic breathing x20 with moist heat pad at abdomen for decreased pain and improved tissue mobility, x4 layers between skin and pack - no adverse reactions post removal.  Hooklying lumbar rotations 2x10 Sitting on ball pelvic rotations 2x10 each Cat cows 2x10 sitting on ball  Transverse abdominis activation in hooklying 2x10 but pain increasing while laying down - returned to ball sitting  Sitting on ball hip flexion with transverse abdominis activation 2x10 each Sitting on ball blue band 2x10 hip abduction  Lumbar roll outs x5 then 30s hold     PATIENT EDUCATION:  08/03/24 Education details: educated patient on abdominal scar massage to do at home, Access Code [email or web address] Person educated: Patient Education  method: Explanation, Demonstration, Tactile cues, Verbal cues, and Handouts Education comprehension: verbalized understanding, returned demonstration, verbal cues required, tactile cues required, and needs further education  HOME EXERCISE PROGRAM: [email or web address]      ASSESSMENT:  CLINICAL IMPRESSION: Patient is a 28 y.o. female who was seen today for physical therapy treatment for Pelvic pain. Pt having high pain levels today. Mobility exercises completed at start of session  with moist heat pack at abdomen for <5 mins but then removed for in sitting exercises. Pt tolerated well with decreased pain to 7/10 overall compared to 10/10 at start. Pt reports most of her pain is period pain related but all other pain still there.  Patient will benefit from skilled therapy to reduce pain and improve quality of life.   OBJECTIVE IMPAIRMENTS: decreased ROM, decreased strength, increased fascial restrictions, increased muscle spasms, and pain.   ACTIVITY LIMITATIONS: lifting and sitting  PARTICIPATION LIMITATIONS: driving and occupation  PERSONAL FACTORS: Time since onset of injury/illness/exacerbation are also affecting patient's functional outcome.   REHAB POTENTIAL: Excellent  CLINICAL DECISION MAKING: Stable/uncomplicated  EVALUATION COMPLEXITY: Low   GOALS: Goals reviewed with patient? Yes  SHORT TERM GOALS: Target date: 08/31/24  Patient educated on scar massage for the lower abdomen to improve tissue mobility.  Baseline:not educated yet Goal status: INITIAL   LONG TERM GOALS: Target date: 01/31/2025  Patient independent with advanced HEP  Baseline: not educated yet Goal status: INITIAL  2.  Patient is able to contract her abdominals without bulging so she is able to lift items with back pain decreased >/= 1-2/10.  Baseline: pain level 8/10 Goal status: INITIAL  3.  Patient reports her lower abdominal and vaginal pain decreased </= 1-2/10 due to reduction of restrictions.   Baseline: Pain level 8/10 Goal status: INITIAL  4.  Patient is able to flex forward then come to neutral without deviating due to reduction  of lumbar pain </= 1-2/10 Baseline:  Goal status: INITIAL   PLAN:  PT FREQUENCY: 1-2x/week  PT DURATION: 6 months  PLANNED INTERVENTIONS: 97110-Therapeutic exercises, 97530- Therapeutic activity, 97112- Neuromuscular re-education, 97535- Self Care, 02859- Manual therapy, 20560 (1-2 muscles), 20561 (3+ muscles)- Dry Needling, Patient/Family education, Taping, Joint mobilization, Spinal mobilization, Scar mobilization, Cryotherapy, Moist heat, and Biofeedback  PLAN FOR NEXT SESSION: manual work to the lower abdomen work on the c-section scar, diaphragmatic breathing, core engagement, show how to lift correctly   Darryle Navy, PT, DPT 10/13/255:01 PM  [facility] [address], [address] [place], [place] [address] Phone # [phone] Fax [phone]

## 2024-08-26 ENCOUNTER — Ambulatory Visit: Admitting: Physical Therapy

## 2024-09-03 ENCOUNTER — Encounter (HOSPITAL_COMMUNITY): Payer: Self-pay

## 2024-09-03 ENCOUNTER — Other Ambulatory Visit: Payer: Self-pay

## 2024-09-03 ENCOUNTER — Ambulatory Visit: Admitting: Physical Therapy

## 2024-09-03 ENCOUNTER — Ambulatory Visit: Payer: Self-pay

## 2024-09-03 ENCOUNTER — Emergency Department (HOSPITAL_COMMUNITY)
Admission: EM | Admit: 2024-09-03 | Discharge: 2024-09-03 | Disposition: A | Discharge status: Home / Self Care | Attending: Student in an Organized Health Care Education/Training Program | Admitting: Student in an Organized Health Care Education/Training Program

## 2024-09-03 ENCOUNTER — Emergency Department (HOSPITAL_COMMUNITY)

## 2024-09-03 DIAGNOSIS — R0602 Shortness of breath: Secondary | ICD-10-CM | POA: Insufficient documentation

## 2024-09-03 DIAGNOSIS — R279 Unspecified lack of coordination: Secondary | ICD-10-CM

## 2024-09-03 DIAGNOSIS — R252 Cramp and spasm: Secondary | ICD-10-CM | POA: Diagnosis not present

## 2024-09-03 DIAGNOSIS — R0789 Other chest pain: Secondary | ICD-10-CM | POA: Insufficient documentation

## 2024-09-03 DIAGNOSIS — R079 Chest pain, unspecified: Secondary | ICD-10-CM

## 2024-09-03 DIAGNOSIS — M6281 Muscle weakness (generalized): Secondary | ICD-10-CM

## 2024-09-03 LAB — BASIC METABOLIC PANEL WITH GFR
Anion gap: 12 (ref 5–15)
BUN: 12 mg/dL (ref 6–20)
CO2: 23 mmol/L (ref 22–32)
Calcium: 9.4 mg/dL (ref 8.9–10.3)
Chloride: 103 mmol/L (ref 98–111)
Creatinine, Ser: 0.82 mg/dL (ref 0.44–1.00)
GFR, Estimated: >60 mL/min (ref >60)
Glucose, Bld: 89 mg/dL (ref 70–99)
Potassium: 3.8 mmol/L (ref 3.5–5.1)
Sodium: 138 mmol/L (ref 135–145)

## 2024-09-03 LAB — TROPONIN I (HIGH SENSITIVITY)
Troponin I (High Sensitivity): 3 ng/L (ref <18)
Troponin I (High Sensitivity): 3 ng/L (ref <18)

## 2024-09-03 LAB — CBC
HCT: 38.2 % (ref 36.0–46.0)
Hemoglobin: 12.4 g/dL (ref 12.0–15.0)
MCH: 29.1 pg (ref 26.0–34.0)
MCHC: 32.5 g/dL (ref 30.0–36.0)
MCV: 89.7 fL (ref 80.0–100.0)
Platelets: 310 K/uL (ref 150–400)
RBC: 4.26 MIL/uL (ref 3.87–5.11)
RDW: 12.9 % (ref 11.5–15.5)
WBC: 8.4 K/uL (ref 4.0–10.5)
nRBC: 0 % (ref 0.0–0.2)

## 2024-09-03 LAB — HCG, SERUM, QUALITATIVE: Preg, Serum: NEGATIVE

## 2024-09-03 MED ORDER — ALBUTEROL SULFATE HFA 108 (90 BASE) MCG/ACT IN AERS
1.0000 | INHALATION_SPRAY | Freq: Once | RESPIRATORY_TRACT | Status: AC
  Administered 2024-09-03: 2 via RESPIRATORY_TRACT
  Filled 2024-09-03: qty 6.7

## 2024-09-03 NOTE — Discharge Instructions (Signed)
 You were evaluated in the emergency department today for SOB.  Based on your evaluation, there is no evidence of a serious or life-threatening condition at this time.  However, we recommend that you continue closely monitoring your symptoms and arrange close follow-up with your primary care provider for reevaluation.  Your symptoms may improve on their own, but we recommend follow-up or return to the emergency department if they continue/worsen.  Follow-up: - Follow-up with your primary care provider or a specialist if your symptoms persist, worsen, or you have additional concerns.   - We typically recommend follow-up with your PCP within the next 1-3 days, unless directed otherwise by your medical provider. - If a follow-up appointment has already been scheduled, we recommend you continue to keep that appointment to discuss your recent ED visit  Return to the emergency department if you experience: - Worsening or new symptoms - New fever or fever that persists - Difficulty breathing, chest pain, severe pain, or weakness. - Signs of infection at any wound site (increased redness, warmth, discharge) - You have any other concerns or unexpected changes  Medications: - New medications prescribed by the ED: albuterol  - Pain relief: Use over-the-counter pain relievers like ibuprofen  or Tylenol  as directed on the package.  Talk to your medical provider if you have certain medical conditions like kidney disease or liver disease to make sure these medications are safe for you to use. - Continue to take all your medications as directed.  Contact your primary care physician (or seek medical care from a medical provider) if you have any side effects or questions  Other instructions: - Drink plenty of fluids to stay hydrated (we recommend water or fluids that contain electrolytes including Gatorade/Powerade/Pedialyte) - Continue to rest and care for yourself at home - Avoid strenuous activity if feeling  unwell - If applicable: consider applying ice packs or a heating pad to the affected area of injury for 20 minutes at a time, 4-5 times a day, for the next 24-48 hours.   Once again, we highly encourage you to contact your primary care provider or return to the emergency department if you have any further concerns or questions.

## 2024-09-03 NOTE — Telephone Encounter (Signed)
 noted

## 2024-09-03 NOTE — ED Provider Notes (Signed)
  EMERGENCY DEPARTMENT AT [facility] Provider Note   CSN: [id number] Arrival date & time: 09/03/24  [id number]     Patient presents with: Chest Pain   Ebony Snyder is a 28 y.o. female.   28 year old female presenting to the emergency department for evaluation of her shortness of breath and intermittent chest discomfort for the last 2 to 3 weeks.  She denies any past medical history.  She denies any recent surgeries, leg swelling, birth control, fevers, blood clotting disorders, or heart palpitations.  She reports intermittent shortness of breath worse with rest.  She called her primary care physician to schedule an appointment but was told to come to the ED for evaluation.  No current medications   Chest Pain      Prior to Admission medications   Medication Sig Start Date End Date Taking? Authorizing Provider  ibuprofen  (ADVIL ) 800 MG tablet Take 1 tablet (800 mg total) by mouth every 8 (eight) hours as needed. 06/09/24   Prentiss Riggs A, NP  metroNIDAZOLE  (METROGEL ) 0.75 % vaginal gel Place 1 Applicatorful vaginally 2 (two) times a week for 32 doses. Begin 1 week after completing initial 5-day course. 06/11/24 09/29/24  Prentiss Riggs A, NP  norethindrone-ethinyl estradiol -FE (LOESTRIN FE) 1-20 MG-MCG tablet Take 1 tablet by mouth daily. 06/09/24   Prentiss Riggs A, NP  sulfamethoxazole -trimethoprim  (BACTRIM  DS) 800-160 MG tablet Take 1 tablet by mouth 2 (two) times daily. 06/04/24   Tanda Bleacher, MD  Vitamin D , Ergocalciferol , (DRISDOL ) 1.25 MG (50000 UNIT) CAPS capsule Take 1 capsule (50,000 Units total) by mouth every 7 (seven) days. 04/10/24   Danton Jon HERO, PA-C  albuterol  (PROVENTIL  HFA;VENTOLIN  HFA) 108 (90 Base) MCG/ACT inhaler Inhale 2 puffs into the lungs every 4 (four) hours as needed for wheezing or shortness of breath (cough, shortness of breath or wheezing.). Patient not taking: Reported on 06/02/2018 08/23/16 10/20/19  Mario Million, MD     Allergies: Doxycycline  hyclate    Review of Systems  Cardiovascular:  Positive for chest pain.  All other systems reviewed and are negative.   Updated Vital Signs BP 131/77 (BP Location: Left Arm)   Pulse 63   Temp 98 F (36.7 C) (Oral)   Resp 20   SpO2 100%   Physical Exam Vitals and nursing note reviewed.  Constitutional:      General: She is not in acute distress. HENT:     Head: Normocephalic.  Cardiovascular:     Rate and Rhythm: Normal rate.     Heart sounds: Normal heart sounds.  Pulmonary:     Effort: Pulmonary effort is normal. No tachypnea or respiratory distress.     Breath sounds: No decreased breath sounds.  Abdominal:     Palpations: Abdomen is soft.  Musculoskeletal:        General: Normal range of motion.     Right lower leg: No edema.     Left lower leg: No edema.  Skin:    General: Skin is warm.     Capillary Refill: Capillary refill takes less than 2 seconds.  Neurological:     Mental Status: She is alert and oriented to person, place, and time.     (all labs ordered are listed, but only abnormal results are displayed) Labs Reviewed  BASIC METABOLIC PANEL WITH GFR  CBC  HCG, SERUM, QUALITATIVE  TROPONIN I (HIGH SENSITIVITY)  TROPONIN I (HIGH SENSITIVITY)    EKG: EKG Interpretation Date/Time:  Thursday September 03 2024 18:35:42 EDT  Ventricular Rate:  64 PR Interval:  146 QRS Duration:  74 QT Interval:  358 QTC Calculation: 369 R Axis:   119  Text Interpretation: Normal sinus rhythm Lateral infarct , age undetermined Cannot rule out Inferior infarct , age undetermined Abnormal ECG When compared with ECG of 11-Nov-2023 11:57, PREVIOUS ECG IS PRESENT Confirmed by Corinthia No ([id number]) on 09/03/2024 9:59:28 PM  Radiology: [facility] Chest 2 View Result Date: 09/03/2024 EXAM: 2 VIEW(S) XRAY OF THE CHEST 09/03/2024 07:04:00 PM COMPARISON: Chest x-ray 11/10/2023. CLINICAL HISTORY: SOB. FINDINGS: LUNGS AND PLEURA: No focal pulmonary opacity. No  pulmonary edema. No pleural effusion. No pneumothorax. HEART AND MEDIASTINUM: No acute abnormality of the cardiac and mediastinal silhouettes. BONES AND SOFT TISSUES: No acute osseous abnormality. IMPRESSION: 1. No acute cardiopulmonary process. Electronically signed by: Morgane Naveau MD 09/03/2024 07:08 PM EDT RP Workstation: [id number]     Procedures   Medications Ordered in the ED  albuterol  (VENTOLIN  HFA) 108 (90 Base) MCG/ACT inhaler 1-2 puff (has no administration in time range)                                    Medical Decision Making 28 year old female presenting for evaluation of her intermittent shortness of breath and chest discomfort over the last 3 weeks.  Differential includes but not limited to asthma, pneumonia, PE, and others.  Wells criteria negative for PE.  Vitals are stable here in the ED and she is 100% on room air.  Ambulatory trial was successful without any respiratory distress.  Lung sounds are clear bilaterally.  Workup including cardiac evaluation was unremarkable. Chest x-ray showed no acute abnormalities including pneumonia or pleural effusions. Patient encouraged to follow-up with their primary care physician regarding the symptoms.  We will trial albuterol  to see if that improves her intermittent shortness of breath.  Return precautions discussed  Amount and/or Complexity of Data Reviewed Labs: ordered. Radiology: ordered.  Risk Prescription drug management.     Final diagnoses:  Nonspecific chest pain  SOB (shortness of breath)    ED Discharge Orders     None          Kelee Cunningham, DO 09/03/24 2243

## 2024-09-03 NOTE — Telephone Encounter (Signed)
 FYI Only or Action Required?: FYI only for provider: ED advised.  Patient was last seen in primary care on 05/07/2024 by Tanda Bleacher, MD.  Called Nurse Triage reporting Chest Pain.  Symptoms began several weeks ago.  Interventions attempted: Rest, hydration, or home remedies.  Symptoms are: gradually worsening.  Triage Disposition: Call EMS 911 Now- taking self to ED  Patient/caregiver understands and will follow disposition?: Yes  Copied from CRM #[id number]. Topic: Clinical - Red Word Triage >> Sep 03, 2024  1:13 PM Yolanda T wrote: [facility] that prompted transfer to Nurse Triage: patient says for 3 weeks she has been feeling a heaviness in her chest but today it has been worse. She also needs an antibiotic for possible VB as she has been cramping Reason for Disposition  [1] Chest pain lasts > 5 minutes AND [2] described as crushing, pressure-like, or heavy  Answer Assessment - Initial Assessment Questions Patient with ongoing chest heaviness for 3 weeks. Constant and not fluctuating. Denies SOB, fever, or abdominal pain. Patient reports feels heaviest in the center of her chest and up a little. Had one episode of left arm pain a few days ago that only lasted a few minutes.   Has really bad anxiety- felt like it was higher a couple weeks ago but the heaviness is present even when anxiety is not.  Denies birth control use, hx of DVT.  Reports symptoms of recurring BV. Unsure if contributing.wanting ABX   Pt agrees to go to ED for evaluation and will reach back out to schedule HFU appt.   1. LOCATION: Where does it hurt?       in the middle of her breast but up a little.  2. RADIATION: Does the pain go anywhere else? (e.g., into neck, jaw, arms, back)     Thinks it radiated to her left shoulder once.  3. ONSET: When did the chest pain begin? (Minutes, hours or days)      3 weeks  4. PATTERN: Does the pain come and go, or has it been constant since it started?  Does it get  worse with exertion?      Constant-  5. DURATION: How long does it last (e.g., seconds, minutes, hours)     Constant for 3 weeks  6. SEVERITY: How bad is the pain?  (e.g., Scale 1-10; mild, moderate, or severe)     Heaviness- 3/10  7. CARDIAC RISK FACTORS: Do you have any history of heart problems or risk factors for heart disease? (e.g., angina, prior heart attack; diabetes, high blood pressure, high cholesterol, smoker, or strong family history of heart disease)     Denies  8. PULMONARY RISK FACTORS: Do you have any history of lung disease?  (e.g., blood clots in lung, asthma, emphysema, birth control pills)     denies 9. CAUSE: What do you think is causing the chest pain?     Unsure if anxiety contributing.  10. OTHER SYMPTOMS: Do you have any other symptoms? (e.g., dizziness, nausea, vomiting, sweating, fever, difficulty breathing, cough)       Denies  11. PREGNANCY: Is there any chance you are pregnant? When was your last menstrual period?       denies  Protocols used: Chest Pain-A-AH

## 2024-09-03 NOTE — ED Triage Notes (Signed)
 QUICK TRIAGE: Pt ambulatory to ER with c/o chest pressure for last 2-3 weeks.  States today had a sharp pain.  Reports [facility].

## 2024-09-03 NOTE — Therapy (Addendum)
 OUTPATIENT PHYSICAL THERAPY FEMALE PELVIC EVALUATION   Patient Name: Ebony Snyder MRN: [id number] DOB:Jul 03, 1996, 28 y.o., female Today's Date: 09/03/2024  END OF SESSION:  PT End of Session - 09/03/24 0721     Visit Number 4    Date for Recertification  01/31/25    Authorization Type UHC medicaid    PT Start Time [phone]    PT Stop Time 0755    PT Time Calculation (min) 38 min    Activity Tolerance Patient tolerated treatment well    Behavior During Therapy WFL for tasks assessed/performed           Past Medical History:  Diagnosis Date   Anxiety    Concussion    Depression    denies   Diabetes mellitus without complication (HCC)    gestational diabetes per pt   Dysmenorrhea    Migraine without aura    STD (sexually transmitted disease)    Past Surgical History:  Procedure Laterality Date   CESAREAN SECTION N/A 02/21/2021   Procedure: CESAREAN SECTION;  Surgeon: Izell Harari, MD;  Location: [facility];  Service: Obstetrics;  Laterality: N/A;  Arrest of Dilation   MOUTH SURGERY     NO PAST SURGERIES     WISDOM TOOTH EXTRACTION     Patient Active Problem List   Diagnosis Date Noted   Diabetes mellitus without complication (HCC)    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   Acute upper respiratory infection 02/12/2023   Lump in lower inner quadrant of right breast 01/15/2022   Anemia, postpartum 04/05/2021   History of cesarean delivery 02/21/2021   History of gestational hypertension 02/20/2021   LGA (large for gestational age) fetus affecting management of mother 01/07/2021   History of gestational diabetes 12/16/2020   Genetic carrier 11/16/2020    PCP: Tanda Bleacher, MD  REFERRING PROVIDER: Prentiss Annabella LABOR, NP   REFERRING DIAG: R10.2 (ICD-10-CM) - Pelvic pain   THERAPY DIAG:  Muscle weakness (generalized)  Unspecified lack of coordination  Cramp and spasm  Rationale for Evaluation and Treatment: Rehabilitation  ONSET DATE:  09/2020  SUBJECTIVE:                                                                                                                                                                                           SUBJECTIVE STATEMENT: Started period to day and pain is very high    FUNCTIONAL LIMITATIONS: none  PERTINENT HISTORY:  Surgeries: Cesarean section 02/21/2021 Sexual abuse: No   PAIN:  Are you having pain? Yes: NPRS scale: 10 Pain location: lumbar, vaginal, abdomen Pain description: dull, achy,sharp; intermittent Aggravating  factors: lifting, bending down Relieving factors: time   PRECAUTIONS: None  RED FLAGS: None   WEIGHT BEARING RESTRICTIONS: No  FALLS:  Has patient fallen in last 6 months? 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[employer]  ACTIVITY LEVEL : walking  PLOF: Independent  PATIENT GOALS: locate where the pain is coming from, reduce pain   BOWEL MOVEMENT: no issues Pain with bowel movement: No Type of bowel movement:Strain no Fully empty rectum: Yes:   Leakage: No                                                   Pads: No Fiber supplement/laxative No  URINATION: Pain with urination: No Fully empty bladder: Yes:                                  Post-void dribble: No Stream: Strong Urgency: No Frequency:during the day every couple of hours                                                         Leakage: none Pads/briefs: No  INTERCOURSE:  Ability to have vaginal penetration No  Pain with intercourse: none Dryness: No Climax: yes Marinoff Scale: 0/3 Lubricant:no  PREGNANCY: Vaginal deliveries 0 C-section deliveries 1 Currently pregnant No  PROLAPSE: None   OBJECTIVE:  Note: Objective measures were completed at Evaluation unless otherwise noted.  DIAGNOSTIC FINDINGS:  none  PATIENT SURVEYS:  PFIQ-7: 62 POPIQ-7: 62  10/30/25PFIQ-7 (58)  COGNITION: Overall cognitive status: Within functional limits for tasks  assessed     SENSATION: Light touch: Appears intact    FUNCTIONAL TESTS:  Single leg stance:  Mu:ozjw to the left  Ou:ozjw to the left Squat:able to squat without difficulty Bed mobility:good   POSTURE: rounded shoulders and forward head   LUMBARAROM/PROM:  A/PROM A/PROM  Eval (% available)  Flexion Full flexion and while she comes up she will lean to the left  Right rotation 75%   (Blank rows = not tested)  LOWER EXTREMITY MNF:uphyuwzdd at endrange of left hip flexion and External rotation   LOWER EXTREMITY MMT:  MMT Right eval Left eval  Hip flexion 4/5 5/5  Hip extension 4/5 4/5  Hip abduction 5/5 3+/5  Hip adduction 4/5 4/5   (Blank rows = not tested) PALPATION:  Abdominal: tenderness located in the lower abdomen; bulges the lower abdomen when contracting  Diastasis: No Distortion: tightness just above the C-section scar  Breathing: no issues Scar tissue: Yes: restrictions along the C-section scar                External Perineal Exam: no tenderness along the muscles                             Internal Pelvic Floor: no tenderness   Patient confirms identification and approves PT to assess internal pelvic floor and treatment Yes No emotional/communication barriers or cognitive limitation. Patient is motivated to learn. Patient understands and agrees with treatment goals and plan. PT explains patient will be examined in standing,  sitting, and lying down to see how their muscles and joints work. When they are ready, they will be asked to remove their underwear so PT can examine their perineum. The patient is also given the option of providing their own chaperone as one is not provided in our facility. The patient also has the right and is explained the right to defer or refuse any part of the evaluation or treatment including the internal exam. With the patient's consent, PT will use one gloved finger to gently assess the muscles of the pelvic floor, seeing how  well it contracts and relaxes and if there is muscle symmetry. After, the patient will get dressed and PT and patient will discuss exam findings and plan of care. PT and patient discuss plan of care, schedule, attendance policy and HEP activities.   PELVIC MMT:   MMT eval  Vaginal 4/5; 10 s  (Blank rows = not tested)         TODAY'S TREATMENT:                                                                                                                              DATE:    08/13/24: Supine diaphragmatic breathing x20 with moist heat pad at abdomen for decreased pain and improved tissue mobility, x4 layers between skin and pack - no adverse reactions post removal.  Lumbar rotations x15 Windshield wipers x15 Single to chest 3x30s each Butterfly stretch 3x30s Pudenudal nerve glides in standing (squat and stand with head flexion/extension) x5 no change in symptoms Seated pelvic tilts x10 2x10 transverse abdominis activation - needs max cues for technique Manual at lower abdomen with fascial release and scar massage complete for decreased tension in lower abdomen and promote improved mobility at pelvis  08/17/24: Supine diaphragmatic breathing x20 with moist heat pad at abdomen for decreased pain and improved tissue mobility, x4 layers between skin and pack - no adverse reactions post removal.  Hooklying lumbar rotations 2x10 Sitting on ball pelvic rotations 2x10 each Cat cows 2x10 sitting on ball  Transverse abdominis activation in hooklying 2x10 but pain increasing while laying down - returned to ball sitting  Sitting on ball hip flexion with transverse abdominis activation 2x10 each Sitting on ball blue band 2x10 hip abduction  Lumbar roll outs x5 then 30s hold  09/03/24: Quad rocking with alt hip IR/ER x15 Adductor rocks x15 each Pigeon pose 3x30s each Happy baby 3x30s with slight rocking laterally Unilateral happy baby 3x30s each with slight lateral rocking Prone with tennis  ball at lateral low abdomen x15 diaphragmatic breathing then switched sides x15 again Sitting with tennis ball at obturator internus 1 min each side Deep squat 3x30s Reverse clams x30s +10 reps each side Educated on continuing scar massage pt agreed   PATIENT EDUCATION:  08/03/24 Education details: educated patient on abdominal scar massage to do at home, Access Code [email or web address] Person educated: Patient Education method: Explanation, Demonstration, Tactile cues, Verbal  cues, and Handouts Education comprehension: verbalized understanding, returned demonstration, verbal cues required, tactile cues required, and needs further education  HOME EXERCISE PROGRAM: [email or web address]      ASSESSMENT:  CLINICAL IMPRESSION: Patient is a 28 y.o. female who was seen today for physical therapy treatment for Pelvic pain. Pt does continue to have pain but reports pelvic pain (not vaginal burning) is slightly improving. Session today pt reports she felt like treatment today had good response and felt better at end, updated HEP and educated pt on tennis ball use for home. Pt is progressing toward goals but still limited with pain. Patient will benefit from skilled therapy to reduce pain and improve quality of life.   OBJECTIVE IMPAIRMENTS: decreased ROM, decreased strength, increased fascial restrictions, increased muscle spasms, and pain.   ACTIVITY LIMITATIONS: lifting and sitting  PARTICIPATION LIMITATIONS: driving and occupation  PERSONAL FACTORS: Time since onset of injury/illness/exacerbation are also affecting patient's functional outcome.   REHAB POTENTIAL: Excellent  CLINICAL DECISION MAKING: Stable/uncomplicated  EVALUATION COMPLEXITY: Low   GOALS: Goals reviewed with patient? Yes  SHORT TERM GOALS: Target date: 08/31/24  Patient educated on scar massage for the lower abdomen to improve tissue mobility.  Baseline:   Goal status: met    LONG TERM GOALS: Target date: 01/31/2025  Patient  independent with advanced HEP  Baseline:  Goal status: met   2.  Patient is able to contract her abdominals without bulging so she is able to lift items with back pain decreased >/= 1-2/10.  Baseline: pain level 8/10 Goal status: not met yet  3.  Patient reports her lower abdominal and vaginal pain decreased </= 1-2/10 due to reduction of restrictions.  Baseline: Pain level 8/10 Goal status: not met yet  4.  Patient is able to flex forward then come to neutral without deviating due to reduction  of lumbar pain </= 1-2/10 Baseline:  Goal status: not met yet   PLAN:  PT FREQUENCY: 1-2x/week  PT DURATION: 6 months  PLANNED INTERVENTIONS: 97110-Therapeutic exercises, 97530- Therapeutic activity, 97112- Neuromuscular re-education, 97535- Self Care, 02859- Manual therapy, 20560 (1-2 muscles), 20561 (3+ muscles)- Dry Needling, Patient/Family education, Taping, Joint mobilization, Spinal mobilization, Scar mobilization, Cryotherapy, Moist heat, and Biofeedback  PLAN FOR NEXT SESSION: manual work to the lower abdomen work on the c-section scar, diaphragmatic breathing, core engagement, show how to lift correctly   Darryle Navy, PT, DPT 10/30/258:59 AM  [facility] [address] [place], [place] [address] Phone # [phone] Fax [phone]

## 2024-09-14 ENCOUNTER — Encounter: Payer: Self-pay | Admitting: Physical Therapy

## 2024-09-14 ENCOUNTER — Ambulatory Visit: Attending: Nurse Practitioner | Admitting: Physical Therapy

## 2024-09-14 DIAGNOSIS — M545 Low back pain, unspecified: Secondary | ICD-10-CM | POA: Diagnosis present

## 2024-09-14 DIAGNOSIS — R1030 Lower abdominal pain, unspecified: Secondary | ICD-10-CM | POA: Diagnosis present

## 2024-09-14 DIAGNOSIS — R252 Cramp and spasm: Secondary | ICD-10-CM | POA: Insufficient documentation

## 2024-09-14 DIAGNOSIS — M6281 Muscle weakness (generalized): Secondary | ICD-10-CM | POA: Diagnosis present

## 2024-09-14 DIAGNOSIS — R279 Unspecified lack of coordination: Secondary | ICD-10-CM | POA: Insufficient documentation

## 2024-09-14 NOTE — Therapy (Signed)
 OUTPATIENT PHYSICAL THERAPY FEMALE PELVIC EVALUATION   Patient Name: Ebony Snyder MRN: [id number] DOB:02/04/1996, 28 y.o., female Today's Date: 09/14/2024  END OF SESSION:  PT End of Session - 09/14/24 1235     Visit Number 5    Date for Recertification  01/31/25    Authorization Type UHC medicaid    Authorization Time Period No author req until 09/05/24    PT Start Time 1230    PT Stop Time 1310    PT Time Calculation (min) 40 min    Activity Tolerance Patient tolerated treatment well    Behavior During Therapy WFL for tasks assessed/performed           Past Medical History:  Diagnosis Date   Anxiety    Concussion    Depression    denies   Diabetes mellitus without complication (HCC)    gestational diabetes per pt   Dysmenorrhea    Migraine without aura    STD (sexually transmitted disease)    Past Surgical History:  Procedure Laterality Date   CESAREAN SECTION N/A 02/21/2021   Procedure: CESAREAN SECTION;  Surgeon: Izell Harari, MD;  Location: [facility];  Service: Obstetrics;  Laterality: N/A;  Arrest of Dilation   MOUTH SURGERY     NO PAST SURGERIES     WISDOM TOOTH EXTRACTION     Patient Active Problem List   Diagnosis Date Noted   Diabetes mellitus without complication (HCC)    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   Acute upper respiratory infection 02/12/2023   Lump in lower inner quadrant of right breast 01/15/2022   Anemia, postpartum 04/05/2021   History of cesarean delivery 02/21/2021   History of gestational hypertension 02/20/2021   LGA (large for gestational age) fetus affecting management of mother 01/07/2021   History of gestational diabetes 12/16/2020   Genetic carrier 11/16/2020    PCP: Tanda Bleacher, MD  REFERRING PROVIDER: Prentiss Annabella LABOR, NP   REFERRING DIAG: R10.2 (ICD-10-CM) - Pelvic pain   THERAPY DIAG:  Muscle weakness (generalized)  Unspecified lack of coordination  Cramp and spasm  Midline low back  pain without sciatica, unspecified chronicity  Lower abdominal pain  Rationale for Evaluation and Treatment: Rehabilitation  ONSET DATE: 09/2020  SUBJECTIVE:                                                                                                                                                                                           SUBJECTIVE STATEMENT: I still feel the same from the last visit. No changes in pain at this time.   FUNCTIONAL LIMITATIONS: none  PERTINENT HISTORY:  Surgeries: Cesarean section 02/21/2021 Sexual abuse: No   PAIN:  Are you having pain? Yes: NPRS scale: 10 Pain location: lumbar, vaginal, abdomen Pain description: dull, achy,sharp; intermittent Aggravating factors: lifting, bending down Relieving factors: time 09/14/24: pain level 9/10 and due to more vaginal burning   PRECAUTIONS: None  RED FLAGS: None   WEIGHT BEARING RESTRICTIONS: No  FALLS:  Has patient fallen in last 6 months? 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[employer]  ACTIVITY LEVEL : walking  PLOF: Independent  PATIENT GOALS: locate where the pain is coming from, reduce pain   BOWEL MOVEMENT: no issues Pain with bowel movement: No Type of bowel movement:Strain no Fully empty rectum: Yes:   Leakage: No                                                   Pads: No Fiber supplement/laxative No  URINATION: Pain with urination: No Fully empty bladder: Yes:                                  Post-void dribble: No Stream: Strong Urgency: No Frequency:during the day every couple of hours                                                         Leakage: none Pads/briefs: No  INTERCOURSE:  Ability to have vaginal penetration No  Pain with intercourse: none Dryness: No Climax: yes Marinoff Scale: 0/3 Lubricant:no  PREGNANCY: Vaginal deliveries 0 C-section deliveries 1 Currently pregnant No  PROLAPSE: None   OBJECTIVE:  Note: Objective measures were  completed at Evaluation unless otherwise noted.  DIAGNOSTIC FINDINGS:  none  PATIENT SURVEYS:  PFIQ-7: 62 POPIQ-7: 62  10/30/25PFIQ-7 (58)  COGNITION: Overall cognitive status: Within functional limits for tasks assessed     SENSATION: Light touch: Appears intact    FUNCTIONAL TESTS:  Single leg stance:  Mu:ozjw to the left  Ou:ozjw to the left Squat:able to squat without difficulty Bed mobility:good   POSTURE: rounded shoulders and forward head   LUMBARAROM/PROM:  A/PROM A/PROM  Eval (% available) 09/14/24  Flexion Full flexion and while she comes up she will lean to the left full  Right rotation 75% full   (Blank rows = not tested)  LOWER EXTREMITY MNF:uphyuwzdd at endrange of left hip flexion and External rotation   LOWER EXTREMITY MMT:  MMT Right eval Left eval Right 09/14/24 Left  09/14/24  Hip flexion 4/5 5/5 5/5 5/5  Hip extension 4/5 4/5 5/5 5/5  Hip abduction 5/5 3+/5 5/5 5/5  Hip adduction 4/5 4/5 5/5 5/5   (Blank rows = not tested) PALPATION:  Abdominal: tenderness located in the lower abdomen; bulges the lower abdomen when contracting  Diastasis: No Distortion: tightness just above the C-section scar  Breathing: no issues Scar tissue: Yes: restrictions along the C-section scar                External Perineal Exam: no tenderness along the muscles  Internal Pelvic Floor: no tenderness   Patient confirms identification and approves PT to assess internal pelvic floor and treatment Yes No emotional/communication barriers or cognitive limitation. Patient is motivated to learn. Patient understands and agrees with treatment goals and plan. PT explains patient will be examined in standing, sitting, and lying down to see how their muscles and joints work. When they are ready, they will be asked to remove their underwear so PT can examine their perineum. The patient is also given the option of providing their own chaperone  as one is not provided in our facility. The patient also has the right and is explained the right to defer or refuse any part of the evaluation or treatment including the internal exam. With the patient's consent, PT will use one gloved finger to gently assess the muscles of the pelvic floor, seeing how well it contracts and relaxes and if there is muscle symmetry. After, the patient will get dressed and PT and patient will discuss exam findings and plan of care. PT and patient discuss plan of care, schedule, attendance policy and HEP activities.   PELVIC MMT:   MMT eval  Vaginal 4/5; 10 s  (Blank rows = not tested)         TODAY'S TREATMENT:                                                                                                                              DATE:    08/13/24: Supine diaphragmatic breathing x20 with moist heat pad at abdomen for decreased pain and improved tissue mobility, x4 layers between skin and pack - no adverse reactions post removal.  Lumbar rotations x15 Windshield wipers x15 Single to chest 3x30s each Butterfly stretch 3x30s Pudenudal nerve glides in standing (squat and stand with head flexion/extension) x5 no change in symptoms Seated pelvic tilts x10 2x10 transverse abdominis activation - needs max cues for technique Manual at lower abdomen with fascial release and scar massage complete for decreased tension in lower abdomen and promote improved mobility at pelvis  08/17/24: Supine diaphragmatic breathing x20 with moist heat pad at abdomen for decreased pain and improved tissue mobility, x4 layers between skin and pack - no adverse reactions post removal.  Hooklying lumbar rotations 2x10 Sitting on ball pelvic rotations 2x10 each Cat cows 2x10 sitting on ball  Transverse abdominis activation in hooklying 2x10 but pain increasing while laying down - returned to ball sitting  Sitting on ball hip flexion with transverse abdominis activation 2x10  each Sitting on ball blue band 2x10 hip abduction  Lumbar roll outs x5 then 30s hold  09/03/24: Quad rocking with alt hip IR/ER x15 Adductor rocks x15 each Pigeon pose 3x30s each Happy baby 3x30s with slight rocking laterally Unilateral happy baby 3x30s each with slight lateral rocking Prone with tennis ball at lateral low abdomen x15 diaphragmatic breathing then switched sides x15 again Sitting with tennis ball at obturator internus 1 min each  side Deep squat 3x30s Reverse clams x30s +10 reps each side Educated on continuing scar massage pt agreed  09/14/24 Discussed goals Discussed doing the c-section scar   PATIENT EDUCATION:  08/03/24 Education details: educated patient on abdominal scar massage to do at home, Access Code [email or web address] Person educated: Patient Education method: Explanation, Demonstration, Tactile cues, Verbal cues, and Handouts Education comprehension: verbalized understanding, returned demonstration, verbal cues required, tactile cues required, and needs further education  HOME EXERCISE PROGRAM: [email or web address]      ASSESSMENT:  CLINICAL IMPRESSION: Patient is a 28 y.o. female who was seen today for physical therapy treatment for Pelvic pain. Patient reports her pain is more with vaginal burning now. She feels like something is going on with the nerves and could be true due to the burning pain. She has improved lumbar ROM and strength of hips. She is consistent with her exercises but at this time does not feel physical therapy has helped.   OBJECTIVE IMPAIRMENTS: decreased ROM, decreased strength, increased fascial restrictions, increased muscle spasms, and pain.   ACTIVITY LIMITATIONS: lifting and sitting  PARTICIPATION LIMITATIONS: driving and occupation  PERSONAL FACTORS: Time since onset of injury/illness/exacerbation are also affecting patient's functional outcome.   REHAB POTENTIAL: Excellent  CLINICAL DECISION MAKING: Stable/uncomplicated  EVALUATION  COMPLEXITY: Low   GOALS: Goals reviewed with patient? Yes  SHORT TERM GOALS: Target date: 08/31/24  Patient educated on scar massage for the lower abdomen to improve tissue mobility.  Baseline:   Goal status: met    LONG TERM GOALS: Target date: 01/31/2025  Patient independent with advanced HEP  Baseline:  Goal status: Not met 09/14/24  2.  Patient is able to contract her abdominals without bulging so she is able to lift items with back pain decreased >/= 1-2/10.  Baseline: pain level 8/10 Goal status: not met 09/14/24  3.  Patient reports her lower abdominal and vaginal pain decreased </= 1-2/10 due to reduction of restrictions.  Baseline: Pain level 8/10 Goal status: not met 09/14/24  4.  Patient is able to flex forward then come to neutral without deviating due to reduction  of lumbar pain </= 1-2/10 Baseline:  Goal status: not met 09/14/24   PLAN:Discharge to HEP this visit    Channing Pereyra, PT 09/14/24 12:48 PM  [facility] [address] [place], [place] [address] Phone # [phone] Fax [phone]   PHYSICAL THERAPY DISCHARGE SUMMARY  Visits from Start of Care: 5  Current functional level related to goals / functional outcomes: See above. Patient has not made any progress in 5 visits.    Remaining deficits: See above    Education / Equipment: HEP   Patient agrees to discharge. Patient goals were not met. Patient is being discharged due to did not respond to therapy.  Thank you for the referral.   Channing Pereyra, PT 09/14/24 12:50 PM

## 2024-09-23 ENCOUNTER — Ambulatory Visit: Payer: Self-pay | Admitting: Physical Therapy

## 2024-09-24 ENCOUNTER — Ambulatory Visit: Admitting: Obstetrics and Gynecology

## 2024-09-24 ENCOUNTER — Encounter: Payer: Self-pay | Admitting: Obstetrics and Gynecology

## 2024-09-24 VITALS — BP 131/82 | HR 87 | Ht 61.75 in | Wt 195.0 lb

## 2024-09-24 DIAGNOSIS — M545 Low back pain, unspecified: Secondary | ICD-10-CM | POA: Diagnosis not present

## 2024-09-24 DIAGNOSIS — N94819 Vulvodynia, unspecified: Secondary | ICD-10-CM | POA: Diagnosis not present

## 2024-09-24 DIAGNOSIS — R102 Pelvic and perineal pain unspecified side: Secondary | ICD-10-CM

## 2024-09-24 MED ORDER — NONFORMULARY OR COMPOUNDED ITEM: 5 refills | Status: DC

## 2024-09-24 NOTE — Patient Instructions (Addendum)
 Please start miralax daily for your bowels. I would like you going daily.   Please use a squatty potty or some sort of stool when you use the restroom to bring the knees closer to the belly button.   We will fax the script for compounded cream to the pharmacy to see if this improves your symptoms. Use it nightly in the vagina for the next month.    Today we talked about ways to manage bladder urgency such as altering your diet to avoid irritative beverages and foods (bladder diet) as well as attempting to decrease stress and other exacerbating factors.    The Most Bothersome Foods* The Least Bothersome Foods*  Coffee - Regular & Decaf Tea - caffeinated Carbonated beverages - cola, non-colas, diet & caffeine -free Alcohols - Beer, Red Wine, White Wine, Champagne Fruits - Grapefruit, [place], Orange, [employer] - Cranberry, Grapefruit, Orange, Pineapple Vegetables - Tomato & Tomato Products Flavor Enhancers - Hot peppers, Spicy foods, Chili, Horseradish, Vinegar, Monosodium glutamate (MSG) Artificial Sweeteners - NutraSweet, Sweet 'N Low, Equal (sweetener), Saccharin Ethnic foods - Mexican, Thai, Indian food [employer] - low-fat & whole Fruits - Bananas, Blueberries, Honeydew melon, Pears, Raisins, Watermelon Vegetables - Broccoli, [address] Sprouts, [place], Carrots, Cauliflower, [place], Cucumber, Mushrooms, Peas, Radishes, Squash, Zucchini, White potatoes, Sweet potatoes & yams Poultry - Chicken, Eggs, Turkey, [employer] - Beef, [employer] Operational [employer], Lamb Seafood - Shrimp, [place] fish, Salmon Grains - Oat, Rice Snacks - Pretzels, Popcorn  *Mitch ALF et al. Diet and its role in interstitial cystitis/bladder pain syndrome (IC/BPS) and comorbid conditions. [employer]. [employer]. 2012 Jan 11.

## 2024-09-24 NOTE — Progress Notes (Signed)
 [facility] New Patient Evaluation and Consultation  Referring Provider: Tanda Bleacher, MD PCP: Tanda Bleacher, MD Date of Service: 09/24/2024  SUBJECTIVE Chief Complaint: New Patient - pelvic pain low ab bilateral, vaginal pain -  (Cramping in groin area; Has had for years; 7lbs 7 oz by Csection was in labor for 36 hours prior to CS)  History of Present Illness: Ebony Snyder is a 28 y.o. Black or African-American female seen in consultation at the request of Dr. Tanda for evaluation of daily vaginal pain/cramping.    Review of records significant for: Has been discharged from pelvic floor PT due to lack of progress/response.   Currently on metrogel  for recurrent BV supression and has been treated for Ureaplasma in the past without symptoms resolutions.   CLINICAL DATA:  Pelvic pain, negative beta HCG, non Gyne etiology suspected, negative ultrasound. Vaginal pain ongoing since 2021.   EXAM: CT ABDOMEN AND PELVIS WITHOUT CONTRAST   TECHNIQUE: Multidetector CT imaging of the abdomen and pelvis was performed following the standard protocol without IV contrast.   RADIATION DOSE REDUCTION: This exam was performed according to the departmental dose-optimization program which includes automated exposure control, adjustment of the mA and/or kV according to patient size and/or use of iterative reconstruction technique.   COMPARISON:  CT June 02, 2018   FINDINGS: Lower chest: No acute abnormality.   Hepatobiliary: Unremarkable noncontrast enhanced appearance of the hepatic parenchyma. Gallbladder is unremarkable. No biliary ductal dilation.   Pancreas: No pancreatic ductal dilation or evidence of acute inflammation.   Spleen: No splenomegaly.   Adrenals/Urinary Tract: Bilateral adrenal glands appear normal. No hydronephrosis. No renal, ureteral or bladder calculi. Urinary bladder is unremarkable for degree of distension.   Stomach/Bowel: Radiopaque enteric  contrast material traverses the hepatic flexure. Stomach is unremarkable for degree of distension. No pathologic dilation of small or large bowel. Colonic stool burden suggestive of constipation. Nonoccluded pending ext.   Vascular/Lymphatic: Normal caliber abdominal aorta. Smooth IVC contours. No pathologically enlarged abdominal or pelvic lymph nodes.   Reproductive: Pelvic reproductive structures are unremarkable in noncontrast enhanced CT appearance for a reproductive age female.   Other: Trace pelvic free fluid is thin physiologic normal limits.   Musculoskeletal: No acute osseous abnormality.   IMPRESSION: 1. No acute abnormality in the abdomen or pelvis. 2. Colonic stool burden suggestive of constipation.  Urinary Symptoms: Does not leak urine.    Day time voids 7.  Nocturia: 0 times per night to void. Voiding dysfunction:  empties bladder well.  Patient does not use a catheter to empty bladder.  When urinating, patient feels she has no difficulties Drinks: Coffee  per day  UTIs: 0 UTI's in the last year.   Denies history of urologic concerns.  No results found for the last 90 days.   Pelvic Organ Prolapse Symptoms:                  Patient Denies a feeling of a bulge the vaginal area.   Bowel Symptom: Bowel movements: 2-4 time(s) per week Stool consistency: soft  Straining: no.  Splinting: no.  Incomplete evacuation: no.  Patient Denies accidental bowel leakage / fecal incontinence Bowel regimen: none Last colonoscopy:Never [facility] Colonoscopy   This patient has no relevant Health Maintenance data.     Sexual Function Sexually active: yes.  Sexual orientation: Bisexual Pain with sex: Yes, at the vaginal opening  Pelvic Pain Admits to pelvic pain Location: Vaginal opening, groin/abdominal region Pain occurs: Everyday and all  day Prior pain treatment: Physical therapy Improved by: Nothing Worsened by: sitting   Past Medical History:  Past Medical  History:  Diagnosis Date   Anxiety    Concussion    Depression    denies   Diabetes mellitus without complication (HCC)    gestational diabetes per pt   Dysmenorrhea    Migraine without aura    STD (sexually transmitted disease)      Past Surgical History:   Past Surgical History:  Procedure Laterality Date   CESAREAN SECTION N/A 02/21/2021   Procedure: CESAREAN SECTION;  Surgeon: Izell Harari, MD;  Location: [facility];  Service: Obstetrics;  Laterality: N/A;  Arrest of Dilation   MOUTH SURGERY     NO PAST SURGERIES     WISDOM TOOTH EXTRACTION       Past OB/GYN History: G2P1011 Vaginal deliveries: 0,  Forceps/ Vacuum deliveries: 0, Cesarean section: 1 Menopausal: No,currently bleeding.  Contraception: none. Last pap smear was 2023.  Any history of abnormal pap smears: no. [facility] PAP          Upcoming     Cervical Cancer Screening (Pap smear) (Every 3 Years) Next due on 02/28/2025    02/28/2022  Cytology - PAP   Only the first 1 history entries have been loaded, but more history exists.                Medications: Patient has a current medication list which includes the following prescription(s): ibuprofen  and NONFORMULARY OR COMPOUNDED ITEM.   Allergies: Patient is allergic to doxycycline  hyclate.   Social History:  Social History   Tobacco Use   Smoking status: Never    Passive exposure: Current   Smokeless tobacco: Never  Vaping Use   Vaping status: Never Used  Substance Use Topics   Alcohol use: Yes    Comment: socially   Drug use: No    Relationship status: single Patient lives with with her daughter.   Patient is employed in clinical biochemist . Regular exercise: No History of abuse: No  Family History:   Family History  Problem Relation Age of Onset   Hypertension Mother    Hypertension Father    Asthma Sister    Early death Maternal Grandmother        32 or 33 heart attack   Diabetes Maternal Grandfather      Review of Systems:  Review of Systems  Constitutional:  Negative for chills and fever.  Respiratory:  Negative for cough and shortness of breath.   Cardiovascular:  Negative for chest pain and palpitations.  Gastrointestinal:  Positive for abdominal pain. Negative for blood in stool, constipation and diarrhea.  Skin:  Negative for rash.  Neurological:  Positive for headaches. Negative for weakness.  Psychiatric/Behavioral:  Negative for depression and suicidal ideas. The patient is nervous/anxious.      OBJECTIVE Physical Exam: Vitals:   09/24/24 1001  BP: 131/82  Pulse: 87  Weight: 195 lb (88.5 kg)  Height: 5' 1.75 (1.568 m)    Physical Exam Vitals reviewed. Exam conducted with a chaperone present.  Constitutional:      Appearance: Normal appearance.  Pulmonary:     Effort: Pulmonary effort is normal.  Abdominal:     Palpations: Abdomen is soft.  Genitourinary:    Comments: +Bleeding  Neurological:     General: No focal deficit present.     Mental Status: She is alert and oriented to person, place, and time.  Psychiatric:  Mood and Affect: Mood normal.        Behavior: Behavior normal. Behavior is cooperative.        Thought Content: Thought content normal.      GU / Detailed Urogynecologic Evaluation:  Pelvic Exam: Normal external female genitalia; Bartholin's and Skene's glands normal in appearance; urethral meatus normal in appearance, no urethral masses or discharge.   CST: negative  Speculum exam reveals normal vaginal mucosa without atrophy. Cervix normal appearance. Uterus normal single, nontender. Adnexa normal adnexa.     With apex supported, anterior compartment defect was reduced  Pelvic floor strength III/V  Pelvic floor musculature: Right levator tender, Right obturator non-tender, Left levator tender, Left obturator non-tender  POP-Q:   POP-Q  -2.5                                            Aa   -2.5                                           Ba  -6                                               C   4                                            Gh  5                                            Pb  8                                            tvl   -2                                            Ap  -2                                            Bp  -6                                              D      Rectal Exam:  Normal external exam   Laboratory Results: Lab Results  Component Value Date   COLORU yellow 08/28/2023   CLARITYU clear 08/28/2023   GLUCOSEUR negative 08/28/2023   BILIRUBINUR NEGATIVE 11/11/2023   KETONESU negative 02/07/2021   SPECGRAV 1.020 08/28/2023   RBCUR  negative 08/28/2023   PHUR 7.5 08/28/2023   PROTEINUR NEGATIVE 03/12/2024   UROBILINOGEN 2.0 (A) 08/28/2023   LEUKOCYTESUR NEGATIVE 11/11/2023    Lab Results  Component Value Date   CREATININE 0.82 09/03/2024   CREATININE 0.70 11/11/2023   CREATININE 0.89 11/10/2023    Lab Results  Component Value Date   HGBA1C 5.4 01/18/2020    Lab Results  Component Value Date   HGB 12.4 09/03/2024     ASSESSMENT AND PLAN Ms. Maurer is a 28 y.o. with:  1. Vulvodynia   2. Vaginal pain   3. Acute midline low back pain, unspecified whether sciatica present    Patient has the most pain at the introitus and labial lip near nearest vaginal opening. There is no skin discoloration, lesions, or other areas that are concerning. She has been having this pain since before her pregnancy in 2021. We discussed starting with a compounded cream with Gabapentin, Baclofen, and Amitriptyline. I have suspicions this is more nerve related in nature based on her pain response and am hopeful this cream will be effective in pain reduction.  Patient does not have significant pelvic floor dysfunction. Once past the introitus, she does not have significant muscle pain, skin pain, or tissue discomfort with manual exam. Patient was discharged from PT.  Patient has significant pain  that seems to stem from the low spine and into the pelvic floor. She has had a pelvic US  and CT Abd Pelvis that did not show abnormalities. I feel like with her significant pain with sitting she needs a pelvic/spine MRI to evaluate if that is where her pain is originating at. I have sent a message to her PCP for guidance on imaging due to her pain.   Patient to follow up in 6 weeks or sooner if needed. Will follow PCP suggestions/recommendations for imaging vs. Orthopedic referral.    Farmer Mccahill G Kamarius Buckbee, NP

## 2024-09-28 ENCOUNTER — Ambulatory Visit: Payer: Self-pay | Admitting: Physical Therapy

## 2024-09-30 ENCOUNTER — Other Ambulatory Visit (HOSPITAL_BASED_OUTPATIENT_CLINIC_OR_DEPARTMENT_OTHER): Payer: Self-pay

## 2024-10-06 ENCOUNTER — Ambulatory Visit: Admitting: Nurse Practitioner

## 2024-10-06 ENCOUNTER — Encounter: Payer: Self-pay | Admitting: Nurse Practitioner

## 2024-10-06 VITALS — BP 114/72 | HR 85

## 2024-10-06 DIAGNOSIS — R102 Pelvic and perineal pain unspecified side: Secondary | ICD-10-CM | POA: Diagnosis not present

## 2024-10-06 DIAGNOSIS — B9689 Other specified bacterial agents as the cause of diseases classified elsewhere: Secondary | ICD-10-CM

## 2024-10-06 DIAGNOSIS — N898 Other specified noninflammatory disorders of vagina: Secondary | ICD-10-CM

## 2024-10-06 DIAGNOSIS — N760 Acute vaginitis: Secondary | ICD-10-CM | POA: Diagnosis not present

## 2024-10-06 DIAGNOSIS — N94819 Vulvodynia, unspecified: Secondary | ICD-10-CM

## 2024-10-06 DIAGNOSIS — N926 Irregular menstruation, unspecified: Secondary | ICD-10-CM

## 2024-10-06 LAB — WET PREP FOR TRICH, YEAST, CLUE

## 2024-10-06 LAB — PREGNANCY, URINE: Preg Test, Ur: NEGATIVE

## 2024-10-06 MED ORDER — METRONIDAZOLE 0.75 % VA GEL: 1.0000 | VAGINAL | 0 refills | Status: DC

## 2024-10-06 MED ORDER — METRONIDAZOLE 500 MG PO TABS: 500.0000 mg | ORAL_TABLET | Freq: Two times a day (BID) | ORAL | 0 refills | Status: DC

## 2024-10-06 NOTE — Progress Notes (Signed)
 Acute Office Visit  Subjective:    Patient ID: Ebony Snyder, female    DOB: [birth year], 28 y.o.   MRN: [id number]   HPI 28 y.o. presents today for follow up and vaginal discharge. At last visit in August (see visit note below 06/09/24), PFPT recommended for pelvic pain. Discharged from PFPT due to no improvement. H/O recurrent BV - long-term course of Metrogel  recommended but patient said pharmacy never filled it. Having discharge and odor today. Symptoms resolve with treatment each time. OCPs and ibuprofen  recommended for pelvic pain and cycle regulation. Missed first day of menses, so she never started. Still wants to try.   Saw urogyn 09/24/24 who suspects vulvodynia due to nerve pain. Prescribed compounded cream of Gabapentin, Baclofen and Amitriptyline but has not heard from pharmacy. MRI also recommended due to pain being worse with sitting. This is being coordinated with PCP. Thinks she may have had miscarriage in October. Menses started 10/13 and then had heavy bleeding 10/18. Feels she is pregnant.   06/09/2024 - vaginal burning and pelvic pain. This has been ongoing for years. Began prior to having daughter in 2021. Pain occurs daily, after intercourse and when sitting for long periods. Pain radiates down legs all the way to toes. Does not feel it runs along buttock and down back of legs. Starting to interfere with her daily life. Has to leave work sometimes due to discomfort after sitting. Was sexually active recently for the first time in a year. Denies history of back injury. Cycles are regular, pain is not related to cycles. Vaginal burning is constant, internal and external. Denies discharge or odor. Negative US  04/02/24, CT 05/07/24 showed constipation, otherwise unremarkable. Feels constipation resolved. H/O recurrent BV. + mycoplasma/ureaplasma January 2025, symptoms did not resolve, neg retest 12/2023 but treated for BV at that time. Does feel symptoms get better while doing BV treatment  but then comes right back. Was prescribed OCPs in the past for possible endo but did not start because she became pregnant.   No LMP recorded.    Review of Systems  Constitutional: Negative.   Genitourinary:  Positive for pelvic pain and vaginal pain. Negative for vaginal discharge.       Objective:    Physical Exam Constitutional:      Appearance: Normal appearance.  Genitourinary:    General: Normal vulva.     Vagina: Vaginal discharge present. No erythema.     Cervix: Normal.     Uterus: Normal.      Adnexa: Right adnexa normal and left adnexa normal.     BP 114/72   Pulse 85   SpO2 99%  Wt Readings from Last 3 Encounters:  09/24/24 195 lb (88.5 kg)  05/07/24 201 lb (91.2 kg)  04/09/24 196 lb 9.6 oz (89.2 kg)        UPT neg  Wet prep + clue cells (+ odor)  Assessment & Plan:   Problem List Items Addressed This Visit   None Visit Diagnoses       Bacterial vaginosis    -  Primary   Relevant Medications   metroNIDAZOLE  (FLAGYL ) 500 MG tablet     Irregular periods       Relevant Orders   Pregnancy, urine (Completed)     Vaginal odor       Relevant Orders   WET PREP FOR TRICH, YEAST, CLUE (Completed)     Vulvodynia         Pelvic pain  Recurrent vaginitis       Relevant Medications   metroNIDAZOLE  (METROGEL ) 0.75 % vaginal gel (Start on 10/08/2024)       Plan: BV - Flagyl  500 mg BID x 7 days. One week later begin Metrogel  twice weekly x 16 weeks. UPT negative. Will start OCPs today. Will reach out to urgyn/pharmacy regarding compounded cream and PCP for MRI.   Return in about 3 months (around 01/04/2025) for Med follow up.  I personally spent a total of 30 minutes in the care of the patient today including preparing to see the patient, getting/reviewing separately obtained history, performing a medically appropriate exam/evaluation, counseling and educating, placing orders, and documenting clinical information in the EHR.   Ebony DELENA Shutter  DNP, 2:25 PM 10/06/2024

## 2024-10-07 ENCOUNTER — Encounter: Payer: Self-pay | Admitting: Obstetrics and Gynecology

## 2024-10-09 NOTE — Telephone Encounter (Signed)
 It's been faxed.

## 2024-10-19 ENCOUNTER — Other Ambulatory Visit: Payer: Self-pay

## 2024-10-19 ENCOUNTER — Inpatient Hospital Stay (HOSPITAL_COMMUNITY)
Admission: AD | Admit: 2024-10-19 | Discharge: 2024-10-19 | Disposition: A | Discharge status: Home / Self Care | Attending: Obstetrics & Gynecology | Admitting: Obstetrics & Gynecology

## 2024-10-19 DIAGNOSIS — O039 Complete or unspecified spontaneous abortion without complication: Secondary | ICD-10-CM

## 2024-10-19 DIAGNOSIS — Z332 Encounter for elective termination of pregnancy: Secondary | ICD-10-CM

## 2024-10-19 LAB — POCT PREGNANCY, URINE: Preg Test, Ur: NEGATIVE

## 2024-10-19 NOTE — MAU Note (Addendum)
.  Ebony Snyder is a 28 y.o. at Unknown here in MAU reporting: Patient reports first day of last menstrual period 08/17/2024 November menstrual cycle was late so she took a pregnacy test on 09/20/2024 it was positive started taking misoprostol  on Monday 11/17/ and 11/18 bleeding and cramping started on Tuesday and bled up until 09/26/2024 (medicine was prescribed via an online resource-pt did not have an u/s and was not seen in person. She then saw her OBGYN 10/06/2024 for a test and it was negative.  Since then she just has not felt right LMP: 08/17/2024 Onset of complaint: 08/17/2024 Pain score:  Vitals:   10/19/24 1618  BP: 135/70  Pulse: 76  Resp: 12  Temp: 98.3 F (36.8 C)  SpO2: 100%      Lab orders placed from triage:   poct preg

## 2024-10-19 NOTE — MAU Provider Note (Signed)
 None     S [name] is a 28 y.o. G52P1011 pregnant female at Unknown who presents to MAU today with complaint of s/p obtained abortion pills online and reports she took first dose 11/17 and then repeated 11/18 and bled until 11/22. Was not seen in person. Followed up with OBGYN and negative pregnancy test there. No bleeding or pain today. Reports occasional fevers but when asked how high states she did not take tempertature. Afebrile here, VSS.    Receives care at [facility]. Prenatal records reviewed.  Pertinent items noted in HPI and remainder of comprehensive ROS otherwise negative.   O BP 135/70   Pulse 76   Temp 98.3 F (36.8 C) (Oral)   Resp 12   Ht 5' 2 (1.575 m)   Wt 89.8 kg   SpO2 100%   BMI 36.21 kg/m  Physical Exam Vitals reviewed.  Constitutional:      General: She is not in acute distress.    Appearance: Normal appearance. She is not ill-appearing, toxic-appearing or diaphoretic.  HENT:     Head: Normocephalic.  Cardiovascular:     Rate and Rhythm: Normal rate and regular rhythm.     Pulses: Normal pulses.     Heart sounds: Normal heart sounds.  Pulmonary:     Effort: Pulmonary effort is normal.     Breath sounds: Normal breath sounds.  Skin:    General: Skin is warm and dry.     Capillary Refill: Capillary refill takes less than 2 seconds.  Neurological:     General: No focal deficit present.     Mental Status: She is alert and oriented to person, place, and time.      MDM:  Low  MAU Course:  A Abortion in first trimester  Miscarriage - Plan: Discharge patient  Medical screening exam complete  Consider complete AB, no further bleeding. Negative pregnancy test x2, bleeding resolved 09/26/24.   P Discharge from MAU in stable condition with routine precautions Follow up at [facility] for GYN care. Patient to call to schedule for a problem visit.  May return with abnormal uterine bleeding or pregnancy concerns.   Allergies as of 10/19/2024        Reactions   Doxycycline  Hyclate Shortness Of Breath        Medication List     STOP taking these medications    metroNIDAZOLE  0.75 % vaginal gel Commonly known as: METROGEL    metroNIDAZOLE  500 MG tablet Commonly known as: FLAGYL    NONFORMULARY OR COMPOUNDED ITEM       TAKE these medications    ibuprofen  800 MG tablet Commonly known as: ADVIL  Take 1 tablet (800 mg total) by mouth every 8 (eight) hours as needed.        Camie Rote, MSN, CNM 10/19/2024 7:30 PM  Certified Nurse Midwife, [facility]

## 2024-10-20 ENCOUNTER — Encounter: Payer: Self-pay | Admitting: Nurse Practitioner

## 2024-10-20 DIAGNOSIS — R102 Pelvic and perineal pain unspecified side: Secondary | ICD-10-CM

## 2024-10-20 DIAGNOSIS — N926 Irregular menstruation, unspecified: Secondary | ICD-10-CM

## 2024-10-20 NOTE — Telephone Encounter (Signed)
 Can do ultrasound only.

## 2024-10-20 NOTE — Telephone Encounter (Signed)
 Attempted number on file x3, call could not go through.

## 2024-10-20 NOTE — Telephone Encounter (Signed)
 Ebony Snyder -PUS only or will patient need consult same day?

## 2024-10-20 NOTE — Telephone Encounter (Signed)
 And should keep appt with PCP next week as well.

## 2024-10-26 ENCOUNTER — Ambulatory Visit: Attending: Family Medicine

## 2024-10-26 ENCOUNTER — Ambulatory Visit: Admitting: Family Medicine

## 2024-10-26 ENCOUNTER — Encounter: Payer: Self-pay | Admitting: Family Medicine

## 2024-10-26 VITALS — BP 117/73 | HR 74 | Ht 62.0 in | Wt 197.2 lb

## 2024-10-26 DIAGNOSIS — R0789 Other chest pain: Secondary | ICD-10-CM

## 2024-10-26 DIAGNOSIS — G8929 Other chronic pain: Secondary | ICD-10-CM

## 2024-10-26 DIAGNOSIS — F419 Anxiety disorder, unspecified: Secondary | ICD-10-CM | POA: Diagnosis not present

## 2024-10-26 DIAGNOSIS — F32A Depression, unspecified: Secondary | ICD-10-CM | POA: Diagnosis not present

## 2024-10-26 DIAGNOSIS — M545 Low back pain, unspecified: Secondary | ICD-10-CM | POA: Diagnosis not present

## 2024-10-26 DIAGNOSIS — F411 Generalized anxiety disorder: Secondary | ICD-10-CM

## 2024-10-26 MED ORDER — DULOXETINE HCL 20 MG PO CPEP: 20.0000 mg | ORAL_CAPSULE | Freq: Every day | ORAL | 1 refills | Status: AC

## 2024-10-26 NOTE — Progress Notes (Unsigned)
 "  Established Patient Office Visit  Subjective    Patient ID: Ebony Snyder, female    DOB: 28-Dec-1995  Age: 28 y.o. MRN: [id number]  CC:  Chief Complaint  Patient presents with   GI Problem    Pt reports going to emergency room earlier this month due to chest pressure. Pt had abortion and since then she has had sudden dizziness, shakes, and stomach pressure/ pain. She reports feeling like she can almost feel something moving in stomach   Was also told by Pain management she needs MRI. Just recently found out her biological father had diabetes. She would like to be tested.     HPI Ebony Snyder presents for complaint of abdominal discomfort. Patient reports that she was seen in ED 09/03/2024 for chest tightness. She found out that she was pregnant and had an abortion and now with abdominal discomfort that is persisting. Also with dizziness and trembling. She still has the chest heaviness.   Outpatient Encounter Medications as of 10/26/2024  Medication Sig   ibuprofen  (ADVIL ) 800 MG tablet Take 1 tablet (800 mg total) by mouth every 8 (eight) hours as needed.   No facility-administered encounter medications on file as of 10/26/2024.    Past Medical History:  Diagnosis Date   Anxiety    Concussion    Depression    denies   Diabetes mellitus without complication (HCC)    gestational diabetes per pt   Dysmenorrhea    Migraine without aura    STD (sexually transmitted disease)     Past Surgical History:  Procedure Laterality Date   CESAREAN SECTION N/A 02/21/2021   Procedure: CESAREAN SECTION;  Surgeon: Izell Harari, MD;  Location: [facility] LD ORS;  Service: Obstetrics;  Laterality: N/A;  Arrest of Dilation   MOUTH SURGERY     NO PAST SURGERIES     WISDOM TOOTH EXTRACTION      Family History  Problem Relation Age of Onset   Hypertension Mother    Hypertension Father    Asthma Sister    Early death Maternal Grandmother        29 or 86 heart attack   Diabetes  Maternal Grandfather     Social History   Socioeconomic History   Marital status: Single    Spouse name: Not on file   Number of children: Not on file   Years of education: Not on file   Highest education level: Not on file  Occupational History   Not on file  Tobacco Use   Smoking status: Never    Passive exposure: Current   Smokeless tobacco: Never  Vaping Use   Vaping status: Never Used  Substance and Sexual Activity   Alcohol use: Yes    Comment: socially   Drug use: No   Sexual activity: Yes    Partners: Male    Birth control/protection: Condom    Comment: condoms 50% of the time, menarche 28 yo, sexual debut 28 yo  Other Topics Concern   Not on file  Social History Narrative   Not on file   Social Drivers of Health   Tobacco Use: Medium Risk (10/26/2024)   Patient History    Smoking Tobacco Use: Never    Smokeless Tobacco Use: Never    Passive Exposure: Current  Financial Resource Strain: Not on file  Food Insecurity: Not on file  Transportation Needs: Not on file  Physical Activity: Not on file  Stress: Not on file  Social Connections: Not  on file  Intimate Partner Violence: Not on file  Depression [id number]): Low Risk (05/07/2024)   Depression (PHQ2-9)    PHQ-2 Score: 0  Alcohol Screen: Not on file  Housing: Not on file  Utilities: Not on file  Health Literacy: Not on file    Review of Systems  All other systems reviewed and are negative.       Objective    BP 117/73   Pulse 74   Ht 5' 2 (1.575 m)   Wt 197 lb 3.2 oz (89.4 kg)   SpO2 97%   BMI 36.07 kg/m   Physical Exam Psychiatric:        Mood and Affect: Mood is anxious and depressed. Affect is tearful.     {Labs (Optional):23779}    Assessment & Plan:   Chronic bilateral low back pain, unspecified whether sciatica present -     MR LUMBAR SPINE WO CONTRAST; Future     No follow-ups on file.   Tanda Raguel SQUIBB, MD  "

## 2024-10-26 NOTE — Progress Notes (Unsigned)
 EP to read.

## 2024-10-27 ENCOUNTER — Encounter: Payer: Self-pay | Admitting: Family Medicine

## 2024-10-27 ENCOUNTER — Other Ambulatory Visit

## 2024-10-27 DIAGNOSIS — R102 Pelvic and perineal pain unspecified side: Secondary | ICD-10-CM | POA: Diagnosis not present

## 2024-10-27 DIAGNOSIS — N926 Irregular menstruation, unspecified: Secondary | ICD-10-CM

## 2024-11-02 ENCOUNTER — Ambulatory Visit: Admitting: Obstetrics and Gynecology

## 2024-11-04 ENCOUNTER — Ambulatory Visit: Payer: Self-pay | Admitting: Nurse Practitioner

## 2024-11-23 ENCOUNTER — Ambulatory Visit: Admitting: Obstetrics and Gynecology

## 2024-11-23 ENCOUNTER — Encounter: Payer: Self-pay | Admitting: Family Medicine

## 2024-11-23 ENCOUNTER — Encounter: Payer: Self-pay | Admitting: *Deleted

## 2024-12-01 NOTE — Telephone Encounter (Signed)
 Pt scheduled

## 2024-12-03 NOTE — Progress Notes (Unsigned)
 [facility] Return Visit  SUBJECTIVE  History of Present Illness: Ebony Snyder is a 29 y.o. female seen in follow-up for vulvodynia, pelvic pain. Plan at last visit was ***topical compounding medication.   Underwent pelvic floor PT without relief Uses Metrogel  for recurrent bacterial vaginosis and treated for ureaplasma without relief, 10/06/24 wet prep + clue cells.  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 Negative repeat testing 12/19/23 Constipation on CT *** with BM 2-4x/week ***cyclical pain, trigger point injection   Past Medical History: Patient  has a past medical history of Anxiety, Concussion, Depression, Diabetes mellitus without complication (HCC), Dysmenorrhea, Migraine without aura, and STD (sexually transmitted disease).   Past Surgical History: She  has a past surgical history that includes Mouth surgery; No past surgeries; Wisdom tooth extraction; and Cesarean section (N/A, 02/21/2021).   Medications: She has a current medication list which includes the following prescription(s): duloxetine  and ibuprofen .   Allergies: Patient is allergic to doxycycline  hyclate.   Social History: Patient  reports that she has never smoked. She has been exposed to tobacco smoke. She has never used smokeless tobacco. She reports current alcohol use. She reports that she does not use drugs.     OBJECTIVE     Physical Exam: There were no vitals filed for this visit. Gen: No apparent distress, A&O x 3.  Detailed Urogynecologic Evaluation:  Deferred. Prior exam showed:      No data to display             ASSESSMENT AND PLAN    [name] is a 29 y.o. with:  No diagnosis found.  There are no diagnoses linked to this encounter.   Ebony ONEIDA Gillis, MD

## 2024-12-04 ENCOUNTER — Ambulatory Visit: Admitting: Obstetrics

## 2024-12-06 ENCOUNTER — Other Ambulatory Visit: Payer: Self-pay | Admitting: Cardiology

## 2024-12-06 DIAGNOSIS — R0789 Other chest pain: Secondary | ICD-10-CM | POA: Diagnosis not present

## 2024-12-06 DIAGNOSIS — R9431 Abnormal electrocardiogram [ECG] [EKG]: Secondary | ICD-10-CM

## 2024-12-08 ENCOUNTER — Encounter: Payer: Self-pay | Admitting: Family Medicine

## 2024-12-08 ENCOUNTER — Ambulatory Visit: Payer: Self-pay | Admitting: Family Medicine

## 2024-12-09 ENCOUNTER — Other Ambulatory Visit

## 2024-12-09 NOTE — Progress Notes (Unsigned)
 [facility] Return Visit  SUBJECTIVE  History of Present Illness: Ebony Snyder is a 29 y.o. female seen in follow-up for vulvodynia, pelvic pain. Plan at last visit was topical compounding medication.   Unable to start topical compounding medication due to cost around the holiday Reports worsening vaginal burning and 10/10 vulvar pain that feels like icy hot in the past 2 weeks Ibuprofen  800mg /day without relief Denies discharge or abnormal bleeding. Menses 2 weeks ago with history of cyclical symptoms.  Has birth control Rx but did not start. 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 Negative repeat testing 12/19/23 Constipation on prior CT, BM 5-6x/week with straining  Past Medical History: Patient  has a past medical history of Anxiety, Concussion, Depression, Diabetes mellitus without complication (HCC), Dysmenorrhea, Migraine without aura, and STD (sexually transmitted disease).   Past Surgical History: She  has a past surgical history that includes Mouth surgery; No past surgeries; Wisdom tooth extraction; and Cesarean section (N/A, 02/21/2021).   Medications: She has a current medication list which includes the following prescription(s): lidocaine , NONFORMULARY OR COMPOUNDED ITEM, valacyclovir , duloxetine , and ibuprofen , and the following Facility-Administered Medications: bupivacaine  and triamcinolone  acetonide.   Allergies: Patient is allergic to doxycycline  hyclate.   Social History: Patient  reports that she has never smoked. She has been exposed to tobacco smoke. She has never used smokeless tobacco. She reports current alcohol use. She reports that she does not use drugs.     OBJECTIVE     Physical Exam: Vitals:    12/10/24 1139  BP: 116/84  Pulse: 98   Physical Exam Constitutional:      General: She is not in acute distress.    Appearance: Normal appearance.  Genitourinary:     Bladder and urethral meatus normal.     No lesions in the vagina.     Right Labia: tenderness and skin changes.     Right Labia: No rash, lesions or Bartholin's cyst.    Left Labia: tenderness.     Left Labia: No lesions, skin changes, Bartholin's cyst or rash.       No vaginal discharge, erythema, tenderness, bleeding, ulceration or granulation tissue.     Urethral meatus caruncle not present.    No urethral prolapse, tenderness, mass, hypermobility or discharge present.     Bladder is not tender, urgency on palpation not present and masses not present.      Levator ani not tender, obturator internus not tender, no asymmetrical contractions present and no pelvic spasms present.    Pelvic Floor comments: High tone pelvic floor. Cardiovascular:     Rate and Rhythm: Normal rate.  Pulmonary:     Effort: Pulmonary effort is normal. No respiratory distress.  Neurological:     Mental Status: She is alert.  Vitals reviewed. Exam conducted with a chaperone present.   Indication(s): Vulvodynia.   Informed Consent:  The alternatives, risks and benefits of the procedure were explained to the patient. Risks including, but not limited to discomfort, pain, bleeding, infection, injury to nearby structures, inability to perform the procedure, failure of the procedure were discussed.  All questions were answered and the patient elected to proceed.  Procedure:   The patient was positioned in dorsal lithotomy position.  The vaginal tissues were prepped with Betadine solution.  An injection of 5cc of a mixture of 90% anesthetic (0.25% Bupivacaine ) and 10% 40mg /ml Triamcinolone  acetomide (Kenalog ) was performed in multiple in the vulva, a total of 4 injection sites. Good hemostasis was noted with reduction of pain to 6/10        ASSESSMENT AND PLAN    Ms. Gartland is a 29 y.o. with:  1. Vulvodynia   2. Constipation, unspecified constipation type     Vulvodynia Assessment & Plan: - reviewed comfort measures and treatment options.  - Rx topical lidocaine  1g PRN pain up to 3x/day, if cost prohibitive change proceed with compounding ointment - Rx Amitriptyline 2.5%/ gabapentin 2.5%/ baclofen 2.5% in vaginal ointment. Sent to compounding pharmacy for use daily - discussed conservative management options with cold/warm compress for comfort - lubrication use during intercourse - s/p pelvic floor PT without relief - pending HSV testing due to recent exacerbation of burning and irritation with history of HSV. Rx Valtrex  to assess clinical change and resume at onset of symptoms if cyclical symptoms around cycles to shorten duration of symptoms. - discussed proper vulvar care, warm compression, avoid pad use, cotton only underwear and barrier ointment if needed  - encouraged scheduled NSAID and tylenol  PRN symptoms - reports exacerbation of pain with menses, encouraged to start continuous OCPs for cycle suppression - repeat Nuswab to r/o infectious etiology due to history of recurrent BV with exacerbation of vulvar pain - underwent R sided vulvar trigger point injection with reduction of pain. Repeat in 1 week if desired  Orders: -     Lidocaine ; Apply 1 Application topically as needed.  Dispense: 35.44 g; Refill: 0 -     Cervicovaginal ancillary only -     Cytology - PAP -     Triamcinolone  Acetonide -     BUPivacaine  HCl -     valACYclovir  HCl; Take 1 tablet (1,000 mg total) by mouth daily for 5 days.  Dispense: 5 tablet; Refill: 1  Constipation, unspecified constipation type Assessment & Plan: - reports straining with BM 5-6x/week - For constipation, we reviewed the importance of a better bowel regimen.  We also discussed the importance of avoiding chronic straining, as it can exacerbate her pelvic floor symptoms;  we discussed treating constipation and straining prior to surgery, as postoperative straining can lead to damage to the repair and recurrence of symptoms. We discussed initiating therapy with increasing fluid intake, fiber supplementation, stool softeners, and laxatives such as miralax.  - encouraged titration of fiber supplementation and squatting position for defecation   Other orders -     NONFORMULARY OR COMPOUNDED ITEM; Amitriptyline 2.5%/ gabapentin 2.5%/ baclofen 2.5% in vaginal ointment.  Place 1g daily in vaginal/ vulvar area.  Dispense 60g with 5 refills.  Dispense: 60 each; Refill: 5  Time spent: I spent 39 minutes dedicated to the care of this patient on the date of this encounter to include pre-visit review of records, face-to-face time with the patient discussing constipation, vulvodynia, and post visit documentation and ordering medication/ testing.   [name], MD

## 2024-12-10 ENCOUNTER — Ambulatory Visit: Admitting: Obstetrics

## 2024-12-10 ENCOUNTER — Encounter: Payer: Self-pay | Admitting: Obstetrics

## 2024-12-10 ENCOUNTER — Other Ambulatory Visit (HOSPITAL_COMMUNITY): Admission: RE | Admit: 2024-12-10 | Source: Ambulatory Visit

## 2024-12-10 VITALS — BP 116/84 | HR 98

## 2024-12-10 DIAGNOSIS — K5900 Constipation, unspecified: Secondary | ICD-10-CM | POA: Diagnosis not present

## 2024-12-10 DIAGNOSIS — N94819 Vulvodynia, unspecified: Secondary | ICD-10-CM | POA: Insufficient documentation

## 2024-12-10 MED ORDER — VALACYCLOVIR HCL 1 G PO TABS: 1000.0000 mg | ORAL_TABLET | Freq: Every day | ORAL | 1 refills | Status: AC

## 2024-12-10 MED ORDER — TRIAMCINOLONE ACETONIDE 40 MG/ML IJ SUSP: 40.0000 mg | Freq: Once | INTRAMUSCULAR | Status: AC

## 2024-12-10 MED ORDER — NONFORMULARY OR COMPOUNDED ITEM: 5 refills | Status: AC

## 2024-12-10 MED ORDER — LIDOCAINE 5 % EX OINT: 1.0000 | TOPICAL_OINTMENT | CUTANEOUS | 0 refills | Status: AC | PRN

## 2024-12-10 MED ORDER — BUPIVACAINE HCL 0.25 % IJ SOLN: 9.0000 mL | Freq: Once | INTRAMUSCULAR | Status: AC

## 2024-12-10 NOTE — Assessment & Plan Note (Addendum)
-   reviewed comfort measures and treatment options.  - Rx topical lidocaine  1g PRN pain up to 3x/day, if cost prohibitive change proceed with compounding ointment - Rx Amitriptyline 2.5%/ gabapentin 2.5%/ baclofen 2.5% in vaginal ointment. Sent to compounding pharmacy for use daily - discussed conservative management options with cold/warm compress for comfort - lubrication use during intercourse - s/p pelvic floor PT without relief - pending HSV testing due to recent exacerbation of burning and irritation with history of HSV. Rx Valtrex  to assess clinical change and resume at onset of symptoms if cyclical symptoms around cycles to shorten duration of symptoms. - discussed proper vulvar care, warm compression, avoid pad use, cotton only underwear and barrier ointment if needed  - encouraged scheduled NSAID and tylenol  PRN symptoms - reports exacerbation of pain with menses, encouraged to start continuous OCPs for cycle suppression - repeat Nuswab to r/o infectious etiology due to history of recurrent BV with exacerbation of vulvar pain - underwent R sided vulvar trigger point injection with reduction of pain. Repeat in 1 week if desired

## 2024-12-10 NOTE — Assessment & Plan Note (Signed)
-   reports straining with BM 5-6x/week - For constipation, we reviewed the importance of a better bowel regimen.  We also discussed the importance of avoiding chronic straining, as it can exacerbate her pelvic floor symptoms; we discussed treating constipation and straining prior to surgery, as postoperative straining can lead to damage to the repair and recurrence of symptoms. We discussed initiating therapy with increasing fluid intake, fiber supplementation, stool softeners, and laxatives such as miralax.  - encouraged titration of fiber supplementation and squatting position for defecation

## 2024-12-10 NOTE — Patient Instructions (Addendum)
 For vulvar pain: - Rx topical lidocaine  1g PRN pain up to 3x/day - Rx Amitriptyline 2.5%/ gabapentin 2.5%/ baclofen 2.5% in vaginal cream. Sent to compounding pharmacy for use daily  [facility] - [place], [place] - [address] [place] [address] Phone: [phone]  Fax: [phone]  - discussed conservative management options with cold compress  Continue ibuprofen  up to 600mg  and Tylenol  up to 500mg  every 6 hours as needed for pain.  - discussed proper vulvar care, warm compression, avoid pad use, cotton only underwear and barrier ointment if needed   Constipation: Our goal is to achieve formed bowel movements daily or every-other-day.  You may need to try different combinations of the following options to find what works best for you - everybody's body works differently so feel free to adjust the dosages as needed.  Some options to help maintain bowel health include:  Dietary changes (more leafy greens, vegetables and fruits; less processed foods) Fiber supplementation (Benefiber, FiberCon, Metamucil or Psyllium). Start slow and increase gradually to full dose. Over-the-counter agents such as: stool softeners (Docusate or Colace) and/or laxatives (Miralax, milk of magnesia)  Power Pudding is a natural mixture that may help your constipation.  To make blend 1 cup applesauce, 1 cup wheat bran, and 3/4 cup prune juice, refrigerate and then take 1 tablespoon daily with a large glass of water as needed.   Women should try to eat at least 21 to 25 grams of fiber a day, while men should aim for 30 to 38 grams a day. You can add fiber to your diet with food or a fiber supplement such as psyllium (metamucil), benefiber, or fibercon.   Here's a look at how much dietary fiber is found in some common foods. When buying packaged foods, check the Nutrition Facts label for fiber content. It can vary among brands.  Fruits Serving size Total fiber (grams)*   Raspberries 1 cup 8.0  Pear 1 medium 5.5  Apple, with skin 1 medium 4.5  Banana 1 medium 3.0  Orange 1 medium 3.0  Strawberries 1 cup 3.0   Vegetables Serving size Total fiber (grams)*  Green peas, boiled 1 cup 9.0  Broccoli, boiled 1 cup chopped 5.0  Turnip greens, boiled 1 cup 5.0  Brussels sprouts, boiled 1 cup 4.0  Potato, with skin, baked 1 medium 4.0  Sweet corn, boiled 1 cup 3.5  Cauliflower, raw 1 cup chopped 2.0  Carrot, raw 1 medium 1.5   Grains Serving size Total fiber (grams)*  Spaghetti, whole-wheat, cooked 1 cup 6.0  Barley, pearled, cooked 1 cup 6.0  Bran flakes 3/4 cup 5.5  Quinoa, cooked 1 cup 5.0  Oat bran muffin 1 medium 5.0  Oatmeal, instant, cooked 1 cup 5.0  Popcorn, air-popped 3 cups 3.5  Brown rice, cooked 1 cup 3.5  Bread, whole-wheat 1 slice 2.0  Bread, rye 1 slice 2.0   Legumes, nuts and seeds Serving size Total fiber (grams)*  Split peas, boiled 1 cup 16.0  Lentils, boiled 1 cup 15.5  Black beans, boiled 1 cup 15.0  Baked beans, canned 1 cup 10.0  Chia seeds 1 ounce 10.0  Almonds 1 ounce (23 nuts) 3.5  Pistachios 1 ounce (49 nuts) 3.0  Sunflower kernels 1 ounce 3.0  *Rounded to nearest 0.5 gram. Source: [employer] for [employer], [employer] birth control pill continuously, discard sugar pills at the end of the pack and move onto the  next pack.

## 2024-12-11 ENCOUNTER — Ambulatory Visit: Payer: Self-pay | Admitting: Obstetrics

## 2024-12-11 DIAGNOSIS — R102 Pelvic and perineal pain unspecified side: Secondary | ICD-10-CM

## 2024-12-11 LAB — CERVICOVAGINAL ANCILLARY ONLY
Bacterial Vaginitis (gardnerella): POSITIVE — AB
Candida Glabrata: NEGATIVE
Candida Vaginitis: NEGATIVE
Comment: NEGATIVE
Comment: NEGATIVE
Comment: NEGATIVE

## 2024-12-11 MED ORDER — METRONIDAZOLE 500 MG PO TABS: 500.0000 mg | ORAL_TABLET | Freq: Two times a day (BID) | ORAL | 0 refills | Status: AC

## 2024-12-11 NOTE — Addendum Note (Signed)
 Addended by: KRYSTAL ANDREE GAILS on: 12/11/2024 10:14 AM   Modules accepted: Orders

## 2024-12-22 ENCOUNTER — Other Ambulatory Visit

## 2025-03-11 ENCOUNTER — Ambulatory Visit: Admitting: Obstetrics
